# Patient Record
Sex: Male | Born: 1960
Health system: Southern US, Community
[De-identification: ages and names within clinical notes are randomized; demographics above are authoritative.]

## PROBLEM LIST (undated history)

## (undated) DIAGNOSIS — N189 Chronic kidney disease, unspecified: Secondary | ICD-10-CM

## (undated) DIAGNOSIS — K219 Gastro-esophageal reflux disease without esophagitis: Secondary | ICD-10-CM

## (undated) DIAGNOSIS — G4730 Sleep apnea, unspecified: Secondary | ICD-10-CM

## (undated) DIAGNOSIS — F1011 Alcohol abuse, in remission: Secondary | ICD-10-CM

## (undated) DIAGNOSIS — M1990 Unspecified osteoarthritis, unspecified site: Secondary | ICD-10-CM

## (undated) DIAGNOSIS — Z87442 Personal history of urinary calculi: Secondary | ICD-10-CM

## (undated) DIAGNOSIS — G610 Guillain-Barre syndrome: Secondary | ICD-10-CM

## (undated) DIAGNOSIS — I10 Essential (primary) hypertension: Secondary | ICD-10-CM

## (undated) HISTORY — PX: TONSILLECTOMY: SUR1361

## (undated) HISTORY — PX: NO PAST SURGERIES: SHX2092

---

## 2015-08-28 ENCOUNTER — Inpatient Hospital Stay (HOSPITAL_COMMUNITY)
Admission: EM | Admit: 2015-08-28 | Discharge: 2015-09-03 | DRG: 096 | Disposition: A | Discharge status: Home / Self Care | Payer: Federal, State, Local not specified - PPO | Attending: Internal Medicine | Admitting: Internal Medicine

## 2015-08-28 ENCOUNTER — Emergency Department (HOSPITAL_COMMUNITY): Payer: Federal, State, Local not specified - PPO

## 2015-08-28 ENCOUNTER — Encounter (HOSPITAL_COMMUNITY): Payer: Self-pay

## 2015-08-28 DIAGNOSIS — R200 Anesthesia of skin: Secondary | ICD-10-CM | POA: Diagnosis not present

## 2015-08-28 DIAGNOSIS — R202 Paresthesia of skin: Secondary | ICD-10-CM | POA: Diagnosis not present

## 2015-08-28 DIAGNOSIS — G610 Guillain-Barre syndrome: Secondary | ICD-10-CM | POA: Insufficient documentation

## 2015-08-28 DIAGNOSIS — I809 Phlebitis and thrombophlebitis of unspecified site: Secondary | ICD-10-CM | POA: Diagnosis not present

## 2015-08-28 DIAGNOSIS — F17290 Nicotine dependence, other tobacco product, uncomplicated: Secondary | ICD-10-CM | POA: Diagnosis present

## 2015-08-28 DIAGNOSIS — Z7952 Long term (current) use of systemic steroids: Secondary | ICD-10-CM

## 2015-08-28 DIAGNOSIS — R29898 Other symptoms and signs involving the musculoskeletal system: Secondary | ICD-10-CM | POA: Diagnosis present

## 2015-08-28 DIAGNOSIS — L539 Erythematous condition, unspecified: Secondary | ICD-10-CM

## 2015-08-28 DIAGNOSIS — M25512 Pain in left shoulder: Secondary | ICD-10-CM

## 2015-08-28 DIAGNOSIS — D696 Thrombocytopenia, unspecified: Secondary | ICD-10-CM | POA: Diagnosis present

## 2015-08-28 DIAGNOSIS — M7542 Impingement syndrome of left shoulder: Secondary | ICD-10-CM | POA: Diagnosis present

## 2015-08-28 DIAGNOSIS — M6289 Other specified disorders of muscle: Secondary | ICD-10-CM | POA: Diagnosis not present

## 2015-08-28 LAB — URINALYSIS, ROUTINE W REFLEX MICROSCOPIC
BILIRUBIN URINE: NEGATIVE
GLUCOSE, UA: NEGATIVE mg/dL
HGB URINE DIPSTICK: NEGATIVE
KETONES UR: NEGATIVE mg/dL
Leukocytes, UA: NEGATIVE
Nitrite: NEGATIVE
PROTEIN: NEGATIVE mg/dL
Specific Gravity, Urine: 1.018 (ref 1.005–1.030)
pH: 7.5 (ref 5.0–8.0)

## 2015-08-28 LAB — CBC WITH DIFFERENTIAL/PLATELET
BASOS ABS: 0 10*3/uL (ref 0.0–0.1)
Basophils Relative: 0 %
Eosinophils Absolute: 0 10*3/uL (ref 0.0–0.7)
Eosinophils Relative: 0 %
HEMATOCRIT: 48.6 % (ref 39.0–52.0)
Hemoglobin: 16.6 g/dL (ref 13.0–17.0)
LYMPHS ABS: 1.4 10*3/uL (ref 0.7–4.0)
LYMPHS PCT: 13 %
MCH: 29.5 pg (ref 26.0–34.0)
MCHC: 34.2 g/dL (ref 30.0–36.0)
MCV: 86.3 fL (ref 78.0–100.0)
MONO ABS: 0.3 10*3/uL (ref 0.1–1.0)
Monocytes Relative: 2 %
NEUTROS ABS: 8.9 10*3/uL — AB (ref 1.7–7.7)
Neutrophils Relative %: 85 %
Platelets: 131 10*3/uL — ABNORMAL LOW (ref 150–400)
RBC: 5.63 MIL/uL (ref 4.22–5.81)
RDW: 12.8 % (ref 11.5–15.5)
WBC: 10.7 10*3/uL — ABNORMAL HIGH (ref 4.0–10.5)

## 2015-08-28 LAB — COMPREHENSIVE METABOLIC PANEL
ALBUMIN: 4.5 g/dL (ref 3.5–5.0)
ALK PHOS: 95 U/L (ref 38–126)
ALT: 28 U/L (ref 17–63)
ANION GAP: 11 (ref 5–15)
AST: 24 U/L (ref 15–41)
BILIRUBIN TOTAL: 0.7 mg/dL (ref 0.3–1.2)
BUN: 16 mg/dL (ref 6–20)
CALCIUM: 10.3 mg/dL (ref 8.9–10.3)
CO2: 26 mmol/L (ref 22–32)
Chloride: 106 mmol/L (ref 101–111)
Creatinine, Ser: 0.85 mg/dL (ref 0.61–1.24)
GFR calc Af Amer: >60 mL/min (ref >60)
GFR calc non Af Amer: >60 mL/min (ref >60)
GLUCOSE: 111 mg/dL — AB (ref 65–99)
Potassium: 4.1 mmol/L (ref 3.5–5.1)
Sodium: 143 mmol/L (ref 135–145)
TOTAL PROTEIN: 7.5 g/dL (ref 6.5–8.1)

## 2015-08-28 LAB — LIPASE, BLOOD: Lipase: 25 U/L (ref 11–51)

## 2015-08-28 MED ORDER — GADOBENATE DIMEGLUMINE 529 MG/ML IV SOLN
20.0000 mL | Freq: Once | INTRAVENOUS | Status: AC | PRN
  Administered 2015-08-28: 20 mL via INTRAVENOUS

## 2015-08-28 NOTE — ED Notes (Signed)
Pt went to MRI.

## 2015-08-28 NOTE — ED Provider Notes (Signed)
CSN: [id number]     Arrival date & time 08/28/15  1715 History   First MD Initiated Contact with Patient 08/28/15 1725     Chief Complaint  Patient presents with  . Neurologic Problem     (Consider location/radiation/quality/duration/timing/severity/associated sxs/prior Treatment) HPI   Patient has a 55 year old male with no past medical history presents to the ED with complaint of numbness, onset 3 days. Patient reports on Thursday he began having numbness to bilateral fingertips. He notes when he woke up Friday morning his numbness began spreading across his entire body and notes it has worsened, denies any aggravating or alleviating factors. He reports having the sensation of "it feels like when your hands/feet fall asleep". He also reports having worsening generalized weakness over the past few days. He states he has had difficulty swallowing and feels like he is swallowing, and when eating food. He also reports having difficulty coughing, bearing down to have a bowel movement or initiating urinating. Patient also endorses having change in vision, he notes it is difficult for him to see things up close. Denies fever, chills, headache, difficulty breathing, chest pain, abdominal pain, nausea, vomiting, diarrhea, blood in urine or stool, syncope, seizure. Patient states he was seen by his PCP on Thursday and was scheduled to have the lab work and an MRI done. He notes he was given prednisone to take and states he has taken 2 doses. Patient also reports recently getting over a URI reported nasal congestion and nonproductive cough.  History reviewed. No pertinent past medical history. History reviewed. No pertinent past surgical history. Family History  Problem Relation Age of Onset  . Cancer Mother   . Cancer Father   . Diabetes Paternal Uncle    Social History  Substance Use Topics  . Smoking status: Current Some Day Smoker    Types: Cigars  . Smokeless tobacco: None  . Alcohol Use: No     Review of Systems  HENT: Positive for trouble swallowing.   Eyes: Positive for visual disturbance.  Gastrointestinal:       Difficulty having a BM  Genitourinary: Positive for difficulty urinating.  Neurological: Positive for weakness and numbness.  All other systems reviewed and are negative.     Allergies  Review of patient's allergies indicates no known allergies.  Home Medications   Prior to Admission medications   Medication Sig Start Date End Date Taking? Authorizing Provider  fluticasone (FLONASE) 50 MCG/ACT nasal spray Place 1 spray into both nostrils daily as needed for allergies or rhinitis.   Yes Historical Provider, MD  Phenyleph-Doxylamine-DM-APAP (ALKA-SELTZER PLS NIGHT CLD/FLU) 5-6.25-10-325 MG CAPS Take 2 capsules by mouth at bedtime as needed (for cold).   Yes Historical Provider, MD  predniSONE (STERAPRED UNI-PAK 21 TAB) 10 MG (21) TBPK tablet Take 20 mg by mouth daily.   Yes Historical Provider, MD   BP 158/92 mmHg  Pulse 83  Temp(Src) 99 F (37.2 C) (Oral)  Resp 14  Ht 5\' 8"  (1.727 m)  Wt 95.709 kg  BMI 32.09 kg/m2  SpO2 96% Physical Exam  Constitutional: He is oriented to person, place, and time. He appears well-developed and well-nourished. No distress.  HENT:  Head: Normocephalic and atraumatic.  Right Ear: Tympanic membrane normal.  Left Ear: Tympanic membrane normal.  Nose: Nose normal.  Mouth/Throat: Uvula is midline, oropharynx is clear and moist and mucous membranes are normal. No oropharyngeal exudate.  Eyes: Conjunctivae and EOM are normal. Pupils are equal, round, and reactive to light. Right  eye exhibits no discharge. Left eye exhibits no discharge. No scleral icterus.  Neck: Normal range of motion. Neck supple.  Cardiovascular: Normal rate, regular rhythm, normal heart sounds and intact distal pulses.   Pulmonary/Chest: Effort normal and breath sounds normal. No respiratory distress. He has no wheezes. He has no rales. He exhibits no  tenderness.  Abdominal: Soft. Bowel sounds are normal. He exhibits no distension and no mass. There is tenderness (epigastric). There is no rebound and no guarding.  Musculoskeletal: Normal range of motion. He exhibits no edema.  Lymphadenopathy:    He has no cervical adenopathy.  Neurological: He is alert and oriented to person, place, and time. A sensory deficit (Pt reports having sensation of "pins and needles" with light palpation to entire body) is present. No cranial nerve deficit. He displays a negative Romberg sign. Coordination normal.  4+/5 strength noted to BUE and BLE. Diminished DTRs bilaterally.  Skin: Skin is warm and dry. He is not diaphoretic.  Nursing note and vitals reviewed.   ED Course  Procedures (including critical care time) Labs Review Labs Reviewed  CBC WITH DIFFERENTIAL/PLATELET - Abnormal; Notable for the following:    WBC 10.7 (*)    Platelets 131 (*)    Neutro Abs 8.9 (*)    All other components within normal limits  COMPREHENSIVE METABOLIC PANEL - Abnormal; Notable for the following:    Glucose, Bld 111 (*)    All other components within normal limits  LIPASE, BLOOD  URINALYSIS, ROUTINE W REFLEX MICROSCOPIC (NOT AT [facility])    Imaging Review No results found. I have personally reviewed and evaluated these images and lab results as part of my medical decision-making.   EKG Interpretation   Date/Time:  Saturday August 28 2015 17:38:35 EST Ventricular Rate:  81 PR Interval:  169 QRS Duration: 102 QT Interval:  379 QTC Calculation: 440 R Axis:   -130 Text Interpretation:  Sinus rhythm Right axis deviation No old tracing to  compare Confirmed by FLOYD MD, DANIEL [phone]) on 08/28/2015 6:09:33 PM      MDM   Final diagnoses:  Paresthesias  Paresthesias  Paresthesias    Patient presents with worsening numbness and generalized weakness. Denies fever. Reports recent URI symptoms over the past week. VSS. Exam revealed mild epigastric tenderness,  no peritoneal signs. On her exam patient reports having "pins and needles" sensation over his entire body with light palpation, 4+/5 strength noted to upper and lower extremities. Diminished DTRs noted bilaterally. EKG revealed sinus rhythm with right axis deviation, no old tracing to compare. WBC 10.7. Remaining labs unremarkable.  Consulted neurology. Dr. Lavon Paganini advised to hold off on ordering imaging until patient is evaluated by Dr. Leroy Kennedy in the ED. He states he will notify Dr. Leroy Kennedy himself. Dr. Cyril Mourning evaluated patient in the ED and placed orders for MRI brain, cervical spine and thoracic spine. Dr. Cyril Mourning spoke with Dr. Adela Lank advised to perform an LP if MRIs are negative. Dr. Rhunette Croft attempted to perform the LP but was unsuccessful. I spoke will Dr. Leroy Kennedy who recommended to admit pt to medicine to have fluro performed for further evaluation. Consulted hospitalist, Dr. Maryfrances Bunnell agrees to admission. Discussed plan for admission with patient.  Satira Sark [place], PA-C 08/29/15 0139  Melene Plan, DO 08/29/15 1353

## 2015-08-28 NOTE — ED Notes (Signed)
Pt returned to room  

## 2015-08-28 NOTE — Consult Note (Signed)
NEURO HOSPITALIST CONSULT NOTE   Referring physician: ED Reason for Consult: diffuse body paresthesias with generalized weakness  HPI:                                                                                                                                          Steven Steele is an 55 y.o. male without significant past medical history, comes in accompanied by his wife for evaluation of the aforementioned symptoms. Stated that never had similar symptoms, but 3 days ago developed acute onset of numbness in the left fingertips that subsequently traveled to the right hand-toes " and eventually moved to the whole body" Michela Pitcher that he feels weak all over, " with a strange sensation of pins and needless all over my body'. Mr. Burdin said that the generalized weakness is so prominent that is hard for him to walk. In addition, he tells me that it is hard to swallow and feels that he is not able to completely empty his bladder and bowels. Wife said that his voice has changed and now is " softer".  He reports a recent cold as well as an itchy rash in his neck that was treated with prednisone pack and is now gone. Denies HA, vertigo, double vision, slurred speech, confusion, language or vision impairment. No fever, chills, diarrhea, foreign, travel, recent surgeries, or vaccination. Available serologies reviewed: wbc 10.7, unremarkable metabolic panel, UA without inection     History reviewed. No pertinent past medical history.  History reviewed. No pertinent past surgical history.  Family History  Problem Relation Age of Onset  . Cancer Mother   . Cancer Father   . Diabetes Paternal Uncle     Family History: no MS, epilepsy, or brain tumor   Social History:  reports that he has been smoking Cigars.  He does not have any smokeless tobacco history on file. He reports that he does not drink alcohol or use illicit drugs.  No Known Allergies  MEDICATIONS:                                                                                                                      Prior to Admission:  (Not in a hospital admission)   ROS:  History obtained from chart review and the patient  General ROS: negative for - chills, fatigue, fever, night sweats, or weight loss Psychological ROS: negative for - behavioral disorder, hallucinations, memory difficulties, mood swings or suicidal ideation Ophthalmic ROS: negative for - blurry vision, double vision, eye pain or loss of vision ENT ROS: negative for - epistaxis, nasal discharge, oral lesions, sore throat, tinnitus or vertigo Allergy and Immunology ROS: negative for - hives or itchy/watery eyes Hematological and Lymphatic ROS: negative for - bleeding problems, bruising or swollen lymph nodes Endocrine ROS: negative for - galactorrhea, hair pattern changes, polydipsia/polyuria or temperature intolerance Respiratory ROS: negative for - hemoptysis, shortness of breath or wheezing Cardiovascular ROS: negative for - chest pain, dyspnea on exertion, edema or irregular heartbeat Gastrointestinal ROS: negative for - abdominal pain, diarrhea, hematemesis, nausea/vomiting or stool incontinence Genito-Urinary ROS: negative for - dysuria, hematuria, incontinence or urinary frequency/urgency Musculoskeletal ROS: negative for - joint swelling Neurological ROS: as noted in HPI Dermatological ROS: negative for rash and skin lesion changes   Physical exam:  Constitutional: well developed, pleasant male in no apparent distress. Blood pressure 154/103, pulse 83, temperature 99 F (37.2 C), temperature source Oral, resp. rate 14, height 5' 8"  (1.727 m), weight 95.709 kg (211 lb), SpO2 95 %. Eyes: no jaundice or exophthalmos.  Head: normocephalic. Neck: supple, no bruits, no JVD. Cardiac: no  murmurs. Lungs: clear. Abdomen: soft, no tender, no mass. Extremities: no edema, clubbing, or cyanosis.  Skin: no rash  Neurologic Examination:                                                                                                      General: NAD Mental Status: Alert, oriented, thought content appropriate.  Speech fluent without evidence of aphasia.  Able to follow 3 step commands without difficulty. Cranial Nerves: II: Discs flat bilaterally; Visual fields grossly normal, pupils equal, round, reactive to light and accommodation III,IV, VI: ptosis not present, extra-ocular motions intact bilaterally V,VII: smile symmetric, facial light touch sensation normal bilaterally VIII: hearing normal bilaterally IX,X: uvula rises symmetrically XI: bilateral shoulder shrug XII: midline tongue extension without atrophy or fasciculations  Motor: 5/5 lower extremities proximally and distally. 4/5 right biceps and weak grip bilaterally but offers poor effort on testing. Tone and bulk:normal tone throughout; no atrophy noted Sensory: Pinprick and light touch intact throughout, bilaterally Deep Tendon Reflexes:  Hypoactive biceps-triceps-knee jerk reflexes bilaterally Plantars: Right: downgoing   Left: upngoing Cerebellar: normal finger-to-nose,  normal heel-to-shin test Gait:  No frank ataxia, negative Romberg's sign    No results found for: CHOL  Results for orders placed or performed during the hospital encounter of 08/28/15 (from the past 48 hour(s))  CBC with Differential     Status: Abnormal   Collection Time: 08/28/15  6:19 PM  Result Value Ref Range   WBC 10.7 (H) 4.0 - 10.5 K/uL   RBC 5.63 4.22 - 5.81 MIL/uL   Hemoglobin 16.6 13.0 - 17.0 g/dL   HCT 48.6 39.0 - 52.0 %   MCV 86.3 78.0 - 100.0 fL  MCH 29.5 26.0 - 34.0 pg   MCHC 34.2 30.0 - 36.0 g/dL   RDW 12.8 11.5 - 15.5 %   Platelets 131 (L) 150 - 400 K/uL   Neutrophils Relative % 85 %   Neutro Abs 8.9 (H) 1.7 -  7.7 K/uL   Lymphocytes Relative 13 %   Lymphs Abs 1.4 0.7 - 4.0 K/uL   Monocytes Relative 2 %   Monocytes Absolute 0.3 0.1 - 1.0 K/uL   Eosinophils Relative 0 %   Eosinophils Absolute 0.0 0.0 - 0.7 K/uL   Basophils Relative 0 %   Basophils Absolute 0.0 0.0 - 0.1 K/uL  Lipase, blood     Status: None   Collection Time: 08/28/15  6:19 PM  Result Value Ref Range   Lipase 25 11 - 51 U/L  Comprehensive metabolic panel     Status: Abnormal   Collection Time: 08/28/15  6:19 PM  Result Value Ref Range   Sodium 143 135 - 145 mmol/L   Potassium 4.1 3.5 - 5.1 mmol/L   Chloride 106 101 - 111 mmol/L   CO2 26 22 - 32 mmol/L   Glucose, Bld 111 (H) 65 - 99 mg/dL   BUN 16 6 - 20 mg/dL   Creatinine, Ser 0.85 0.61 - 1.24 mg/dL   Calcium 10.3 8.9 - 10.3 mg/dL   Total Protein 7.5 6.5 - 8.1 g/dL   Albumin 4.5 3.5 - 5.0 g/dL   AST 24 15 - 41 U/L   ALT 28 17 - 63 U/L   Alkaline Phosphatase 95 38 - 126 U/L   Total Bilirubin 0.7 0.3 - 1.2 mg/dL   GFR calc non Af Amer >60 >60 mL/min   GFR calc Af Amer >60 >60 mL/min    Comment: (NOTE) The eGFR has been calculated using the CKD EPI equation. This calculation has not been validated in all clinical situations. eGFR's persistently <60 mL/min signify possible Chronic Kidney Disease.    Anion gap 11 5 - 15  Urinalysis, Routine w reflex microscopic     Status: None   Collection Time: 08/28/15  7:32 PM  Result Value Ref Range   Color, Urine YELLOW YELLOW   APPearance CLEAR CLEAR   Specific Gravity, Urine 1.018 1.005 - 1.030   pH 7.5 5.0 - 8.0   Glucose, UA NEGATIVE NEGATIVE mg/dL   Hgb urine dipstick NEGATIVE NEGATIVE   Bilirubin Urine NEGATIVE NEGATIVE   Ketones, ur NEGATIVE NEGATIVE mg/dL   Protein, ur NEGATIVE NEGATIVE mg/dL   Nitrite NEGATIVE NEGATIVE   Leukocytes, UA NEGATIVE NEGATIVE    Comment: MICROSCOPIC NOT DONE ON URINES WITH NEGATIVE PROTEIN, BLOOD, LEUKOCYTES, NITRITE, OR GLUCOSE <1000 mg/dL.    No results  found.   Assessment/Plan: 55 y/o without known past medical history, presents with complains of generalized body paresthesias-weakness. Markedly hypoactive deep tendon reflexes upper and lower extremities bilaterally, ? upgoing left toe, but can not appreciate definite weakness in the lower extremites and suspect poor effort on testing motor strength upper extremities.  Patient syndrome is not entirely localizable to a specific segment of the neuro-axis. He certainly has depressed DTR's but the pattern of sensory and motor involvement is not quite typical for GBS. Ordered MRI brain and cervico-thoracic spine with and without contrast. LP if MRI negative. Will follow up after testing.  Dorian Pod, MD 08/28/2015, 7:51 PM  [facility]

## 2015-08-28 NOTE — ED Notes (Signed)
Pt remains in MRI 

## 2015-08-28 NOTE — ED Notes (Signed)
Pt arrives with c/o numbness starting at fingertips on Thursday and increasing over body since. Pt c/o difficulty swallowing. C/o intermittent rash at neck.

## 2015-08-29 ENCOUNTER — Encounter (HOSPITAL_COMMUNITY): Payer: Self-pay | Admitting: Family Medicine

## 2015-08-29 ENCOUNTER — Inpatient Hospital Stay (HOSPITAL_COMMUNITY): Payer: Federal, State, Local not specified - PPO

## 2015-08-29 DIAGNOSIS — D696 Thrombocytopenia, unspecified: Secondary | ICD-10-CM | POA: Diagnosis present

## 2015-08-29 DIAGNOSIS — Z7952 Long term (current) use of systemic steroids: Secondary | ICD-10-CM | POA: Diagnosis not present

## 2015-08-29 DIAGNOSIS — R200 Anesthesia of skin: Secondary | ICD-10-CM | POA: Diagnosis present

## 2015-08-29 DIAGNOSIS — I809 Phlebitis and thrombophlebitis of unspecified site: Secondary | ICD-10-CM | POA: Diagnosis not present

## 2015-08-29 DIAGNOSIS — F17290 Nicotine dependence, other tobacco product, uncomplicated: Secondary | ICD-10-CM | POA: Diagnosis present

## 2015-08-29 DIAGNOSIS — R202 Paresthesia of skin: Secondary | ICD-10-CM | POA: Diagnosis not present

## 2015-08-29 DIAGNOSIS — G610 Guillain-Barre syndrome: Secondary | ICD-10-CM | POA: Diagnosis present

## 2015-08-29 DIAGNOSIS — L539 Erythematous condition, unspecified: Secondary | ICD-10-CM | POA: Diagnosis not present

## 2015-08-29 DIAGNOSIS — R29898 Other symptoms and signs involving the musculoskeletal system: Secondary | ICD-10-CM | POA: Diagnosis present

## 2015-08-29 DIAGNOSIS — M6289 Other specified disorders of muscle: Secondary | ICD-10-CM

## 2015-08-29 DIAGNOSIS — M7542 Impingement syndrome of left shoulder: Secondary | ICD-10-CM | POA: Diagnosis present

## 2015-08-29 LAB — CSF CELL COUNT WITH DIFFERENTIAL
EOS CSF: NONE SEEN % (ref 0–1)
RBC Count, CSF: 2 /mm3 — ABNORMAL HIGH
Segmented Neutrophils-CSF: NONE SEEN % (ref 0–6)
Tube #: 1
WBC, CSF: 0 /mm3 (ref 0–5)

## 2015-08-29 LAB — PROTEIN AND GLUCOSE, CSF
Glucose, CSF: 60 mg/dL (ref 40–70)
Total  Protein, CSF: 33 mg/dL (ref 15–45)

## 2015-08-29 MED ORDER — LORAZEPAM 1 MG PO TABS
1.0000 mg | ORAL_TABLET | Freq: Once | ORAL | Status: DC
  Filled 2015-08-29: qty 1

## 2015-08-29 MED ORDER — ACETAMINOPHEN 325 MG PO TABS
650.0000 mg | ORAL_TABLET | ORAL | Status: DC | PRN
  Administered 2015-08-29 – 2015-09-03 (×9): 650 mg via ORAL
  Filled 2015-08-29 (×9): qty 2

## 2015-08-29 MED ORDER — FLUTICASONE PROPIONATE 50 MCG/ACT NA SUSP
1.0000 | Freq: Every day | NASAL | Status: DC | PRN
Start: 2015-08-29 — End: 2015-09-03
  Administered 2015-08-29: 1 via NASAL
  Filled 2015-08-29: qty 16

## 2015-08-29 MED ORDER — HYDROCODONE-ACETAMINOPHEN 5-325 MG PO TABS
1.0000 | ORAL_TABLET | ORAL | Status: DC | PRN
  Filled 2015-08-29: qty 1

## 2015-08-29 MED ORDER — LIDOCAINE-PRILOCAINE 2.5-2.5 % EX CREA: TOPICAL_CREAM | Freq: Once | CUTANEOUS | Status: DC | PRN

## 2015-08-29 NOTE — ED Notes (Signed)
MD at bedside. 

## 2015-08-29 NOTE — Progress Notes (Signed)
Called to room by patient and his significant other; anxious to know test results; patient states,"i'm shutting down and I'm in this room.  Again denies shortness of breath, chest pain, ambulated again to the bathroom without gait disturbance; patient states, "my throat is closing". Patient's voice remains clear, vital signs stable, no cough present, swallowing without difficulty.  C/o sinus and can't breath out of his nose.  [facility] notified and Neurology in route to see patient now.

## 2015-08-29 NOTE — Progress Notes (Signed)
Pt c/o "fading away" and that his "body is shutting down." VS stable. Family at bedside demanding to see doctor. On call coverage paged. NP states she is not available to visit pt at this time. Neurologist paged. Dr. Leroy Kennedyamilo came to see pt. Pt and family informed that pt's attending MD will come tomorrow and address questions about possible antibiotic tx. Family and pt state that they understand. Will continue to monitor. Gara KronerHayles, Fanny Agan M, RN

## 2015-08-29 NOTE — ED Provider Notes (Signed)
  Physical Exam  BP 122/78 mmHg  Pulse 57  Temp(Src) 99 F (37.2 C) (Oral)  Resp 11  Ht 5\' 8"  (1.727 m)  Wt 211 lb (95.709 kg)  BMI 32.09 kg/m2  SpO2 97%  Physical Exam  ED Course  .Lumbar Puncture Date/Time: 08/29/2015 12:27 AM Performed by: Derwood KaplanNANAVATI, Zyrah Wiswell Authorized by: Derwood KaplanNANAVATI, Sevilla Murtagh Consent: Written consent obtained. Risks and benefits: risks, benefits and alternatives were discussed Consent given by: patient Patient understanding: patient states understanding of the procedure being performed Patient consent: the patient's understanding of the procedure matches consent given Site marked: the operative site was marked Patient identity confirmed: arm band Time out: Immediately prior to procedure a "time out" was called to verify the correct patient, procedure, equipment, support staff and site/side marked as required. Indications: evaluation for infection Anesthesia: local infiltration Local anesthetic: lidocaine 1% with epinephrine Anesthetic total: 3 ml Patient sedated: no Preparation: Patient was prepped and draped in the usual sterile fashion. Lumbar space: L4-L5 interspace Patient's position: left lateral decubitus Needle gauge: 20 Needle type: spinal needle - Quincke tip Number of attempts: 3 Post-procedure: site cleaned and adhesive bandage applied Patient tolerance: Patient tolerated the procedure well with no immediate complications    MDM  I attempted LP after the MRI was neg. On my 3 attempts, i consistently ended up hitting the bone, despite going in quite deep. LP under fluoro to be done tomorrow.       Derwood KaplanAnkit Gwendlyon Zumbro, MD 08/29/15 [phone]

## 2015-08-29 NOTE — Progress Notes (Signed)
Patient seen and examined  Admitted for suspected GBS,generalized body paresthesias-weakness MRI negative LP pending We'll check Lyme titers given rash on the neck one month ago

## 2015-08-29 NOTE — ED Notes (Signed)
Patient is resting comfortably. 

## 2015-08-29 NOTE — H&P (Signed)
History and Physical  Patient Name: Steven Steele     ZOX:[id number]    DOB: 1961/04/08    DOA: 08/28/2015 Referring physician: Melburn Hake, PA-C PCP: Farris Has, MD      Chief Complaint: Acute paresthesias  HPI: Steven Steele is a 55 y.o. male with no significant past medical history significant who presents with paresthesias for two days.  The patient was in his usual state of health until three days ago when he went to bed with "pins and needles" in the left hand.  The next day, he woke up with this sensation "prickly" and "pins and needles" all over his whole body (face, mouth, trunk, arms, legs).  He went to his PCP who recommended prednisone.  Today, the sensation was no different, and he noticed difficulty with weak hand grip, weakness of his eyes looking up (no diplopia), feeling like food in his mouth was "like cotton", abnormal sensation while having a bowel movement, feeling like his bowels were "stuck", weak cough, soft voice, and incomplete bowel and bladder evacuation.  So he came to the ER.  In the ED, the patient had a low grade fever, mild tachycardia. The electrolytes, urine dipstick and LFTs were normal.  There was mild thrombocytopenia but no leukocytosis or anemia.  An MRI of the brain, and cervical/thoracic spinal cord was unremarkable (mild degenerative changes).    He reports no previous symptoms, no previous major illnesses.  Was just getting over a URI and exposed to wife who had same.  Had a rash on the neck that has appeared several times since Nov, always on the neck, itchy, and lasting hours to a day, then completely disappearing (picture from wife's phone shows 3-4 small hives on the anterior neck, surrounding erythema).  He works in [employer], no chemical exposures.  Last travel was to [facility] for work, >1 month ago.  He eats a varied diet. Is planning a wedding soon, but no recent life stressors.   Review of Systems:  He endorses weak cough,  resolving URI symptoms, bowel and bladder control changes, can't "eyes up all the way," focus up close. Also endorses rash as above, abdominal discomfort/"gut not working" and memory loss.  General: Denies fever, chills, fatigue, malaise, night sweats, or weight loss. Psychological: Denies hallucinations, life stressors. Ophthalmic: Denies photophobia.   No diplopia. ENT: Denies headache, sore throat, epistaxis. Hematological and Lymphatic: Denies excessive bleeding, blood clots, bruising, night sweats, pallor, or swollen lymph nodes. Endocrine: Denies hair loss, polydipsia/polyuria, or temperature intolerance. Respiratory: Denies hemoptysis, shortness of breath, sputum changes, or wheezing. Cardiovascular: Denies chest discomfort, dyspnea on exertion, LE edema, irregular heartbeat, orthopnea, paroxysmal nocturnal dyspnea, or rapid heart rate. Gastrointestinal: Denies abdominal pain, appetite loss, blood in stools, constipation, diarrhea, heartburn, hematemesis, melena, nausea/vomiting, dysphagia. Genitourinary: Denies dysuria, urinary frequency, hematuria, incontinence.   Musculoskeletal: Denies joint pain/swelling, muscle pain, injury, or pain or swelling in extremities/back, neck.    Neurological: Denies confusion, seizures, syncope, weakness, speech problems, ataxia, headaches. Dermatological: Denies changes to hair or nails.     Allergies: No Known Allergies  Home medications: 1. Fluticasone PRN  Past medical history: 1. Vertigo once  Past surgical history: None  Family history:  Lung cancer, father. Breast cancer, mother. Diabetes, paternal uncle. No family history of neuropathy.  No family history of autoimmune disease that he knows of.    Social History:  Patient lives with his fiancee.  He works in Teacher, English as a foreign language.  He travels to [place] occasionally for work, not  in many months, no exposure to chemicals at home or work.  Sick contact in wife, URI.  Rare cigar use.   Recovering alcoholic, no alcohol currently.  Denies illicit drugs.  No life stressors other than planning wedding.  From [place], moved to [place] about 2 years ago.        Physical Exam: BP 122/78 mmHg  Pulse 57  Temp(Src) 99 F (37.2 C) (Oral)  Resp 11  Ht 5\' 8"  (1.727 m)  Wt 95.709 kg (211 lb)  BMI 32.09 kg/m2  SpO2 97% General appearance: Well-developed, adult male, alert and in no acute distress.   Eyes: Anicteric, conjunctiva pink, lids and lashes normal.     ENT: No nasal deformity, discharge, or epistaxis.  OP moist without lesions.   Skin: Warm and dry.  No suspicious rashes or lesions.  Image of rash from wife's phone described above. Cardiac: Capillary refill is brisk.  JVP normal.  No LE edema.  Radial pulses 2+ and symmetric. Respiratory: Normal respiratory rate and rhythm.  CTAB. Abdomen: Abdomen soft without rigidity.  No TTP. No ascites, distension.   MSK: No deformities or effusions. Neuro: Sensorium intact and responding to questions, attention normal.  Speech is fluent.  Muscle bulk normal.  Appears slow and globally weak, but strength testing in upper and lower extremities bilaterally is symmetric and 5/5.   Touch testing on feet, arms is hyperesthetic to pinprick.  Intact to light touch.  All symmetric.   Psych: Behavior appropriate.  Affect constrained.  No evidence of aural or visual hallucinations or delusions.       Labs on Admission:  The metabolic panel shows normal electrolytes and renal function. Transaminases and bilirubin normal. Lipase normal. UA clear. The complete blood count shows mild thrombocytopenia.   Radiological Exams on Admission: [name] Wo Contrast and Mr Cervical Spine W Wo Contrast 08/28/2015   IMPRESSION: MRI BRAIN:  Normal MRI of the brain with and without contrast. MRI CERVICAL SPINE: Straightened cervical lordosis without acute fracture or malalignment. Normal contrast-enhanced MRI of the cervical spinal cord. Degenerative change  of the cervical spine superimposed on borderline congenital canal narrowing. Mild canal stenosis C4-5 through C6-7. Neural foraminal narrowing C3-4 through C5-6, C7-T1: Moderate to severe on the RIGHT at C3-4 and on the LEFT at C4-5. Electronically Signed   By: Awilda Metro M.D.   On: 08/28/2015 23:21   Mr Thoracic Spine W Wo Contrast 08/28/2015   IMPRESSION: Early degenerative change of thoracic spine without acute fracture nor malalignment. No neurocompressive changes. Normal MRI of the thoracic spinal cord with and without contrast. Electronically Signed   By: Awilda Metro M.D.   On: 08/28/2015 23:32    EKG: Independently reviewed. Sinus, rate 81.  RAD and poor R-wave progression.  No STTW changes.    Assessment/Plan 1. Paresthesias and weak hand grip:  This is new.  The onset was acute, the symptoms include sensory and motor systems.  The involvement of the mouth and face seems to suggest a systemic illness or central process.  MRI normal. The remaining differential diagnosis potentially includes GBS, nutritional deficiencies.  Given that it is not just diffuse, but uniform and symmetric, I find vasculitis doubtful.  Hyperesthesia is prominent.  Normal MRI rules out stroke and MS.   -LP under fluoroscopic guidance (cell count, differential, culture, other studies per Neurology) -Consult to Neurology, appreciate cares -Will defer laboratory work up to Neurology     DVT PPx: SCDs Diet: Regular Consultants: Neurology  Code Status: Full Family Communication: Steffanie RainwaterFiancee present at bedside.  Medical decision making: What exists of the patient's previous chart was reviewed in depth and the case was discussed with Dr Leroy Kennedyamilo and Melburn HakeNicole Nadeau, PA_C. Patient seen 2:24 AM on 08/29/2015.  Disposition Plan:  Admit for observation and fluoro-guided LP.  If accomplished, discharge home tomorrow at Neuro discretion.      Alberteen SamChristopher P Lenell Lama [facility] Pager  [phone]

## 2015-08-29 NOTE — Progress Notes (Signed)
NEURO HOSPITALIST PROGRESS NOTE   SUBJECTIVE:                                                                                                                        Lying in bed, said that " I feel that I am shutting down, my body is fading away". Further, feels that his abdomen is blowing, can not move his bowels, can barely swallow, feels short of breath an very weak all over, can not even walk to the bathroom. MRI brain and cervico-thoracic spine is unrevealing. CSF unremarkable. Lyme titers pending.  OBJECTIVE:                                                                                                                           Vital signs in last 24 hours: Temp:  [97.5 F (36.4 C)-98.3 F (36.8 C)] 98.3 F (36.8 C) (01/08 1721) Pulse Rate:  [55-96] 79 (01/08 1721) Resp:  [8-19] 16 (01/08 1721) BP: (98-166)/(71-106) 149/104 mmHg (01/08 1721) SpO2:  [88 %-99 %] 99 % (01/08 1721)  Intake/Output from previous day:   Intake/Output this shift:   Nutritional status: Diet regular Room service appropriate?: Yes; Fluid consistency:: Thin  History reviewed. No pertinent past medical history.  Physical exam:  Constitutional: well developed, pleasant male, looks anxious Eyes: no jaundice or exophthalmos.  Head: normocephalic. Neck: supple, no bruits, no JVD. Cardiac: no murmurs. Lungs: clear. Abdomen: soft, no tender, no mass. Extremities: no edema, clubbing, or cyanosis.  Skin: no rash  Neurologic Exam:  General: NAD Mental Status: Alert, oriented, thought content appropriate. Speech fluent without evidence of aphasia. Able to follow 3 step commands without difficulty. Cranial Nerves: II: Discs flat bilaterally; Visual fields grossly normal, pupils equal, round, reactive to light and accommodation III,IV, VI: ptosis not present, extra-ocular motions intact bilaterally V,VII: smile symmetric, facial light touch sensation normal  bilaterally VIII: hearing normal bilaterally IX,X: uvula rises symmetrically XI: bilateral shoulder shrug XII: midline tongue extension without atrophy or fasciculations Motor: 5/5 lower extremities proximally and distally. 4/5 right biceps and weak grip bilaterally but offers poor effort on testing. Tone and bulk:normal tone throughout; no atrophy noted Sensory: Pinprick and light touch intact throughout, bilaterally Deep Tendon Reflexes:  Hypoactive biceps-triceps-knee  jerk reflexes bilaterally Plantars: Right: downgoingLeft: upngoing Cerebellar: normal finger-to-nose, normal heel-to-shin test Gait:  No frank ataxia, negative Romberg's sign   Lab Results: No results found for: CHOL Lipid Panel No results for input(s): CHOL, TRIG, HDL, CHOLHDL, VLDL, LDLCALC in the last 72 hours.  Studies/Results: [name] Wo Contrast  08/28/2015  CLINICAL DATA:  Developed LEFT hand hand numbness 3 days ago, ascending to entire body with generalized weakness and paresthesias. Difficulty walking and swallowing. EXAM: MRI HEAD WITHOUT AND WITH CONTRAST MRI CERVICAL SPINE WITHOUT AND WITH CONTRAST TECHNIQUE: Multiplanar, multiecho pulse sequences of the brain and surrounding structures, and cervical spine, to include the craniocervical junction and cervicothoracic junction, were obtained without and with intravenous contrast. CONTRAST:  20mL MULTIHANCE GADOBENATE DIMEGLUMINE 529 MG/ML IV SOLN 20 cc MultiHance COMPARISON:  None. FINDINGS: MRI HEAD FINDINGS The ventricles and sulci are normal for patient's age. No abnormal parenchymal signal, mass lesions, mass effect. No abnormal parenchymal enhancement. No reduced diffusion to suggest acute ischemia. No susceptibility artifact to suggest hemorrhage. No abnormal extra-axial fluid collections. No extra-axial masses nor leptomeningeal enhancement. Normal major intracranial vascular flow voids seen at the skull base. Ocular  globes and orbital contents are nonsuspicious though not tailored for evaluation ; old LEFT medial orbital blowout fracture. No suspicious calvarial bone marrow signal. No abnormal sellar expansion. Craniocervical junction maintained. Mild fronto ethmoidal mucosal thickening without paranasal sinus air-fluid levels. Mastoid air cells are well aerated. MRI CERVICAL SPINE FINDINGS Cervical vertebral bodies intact and aligned with straightened cervical lordosis. Borderline congenital canal narrowing on the basis of foreshortened pedicles. Moderate to severe C5-6 disc height loss, moderate at C6-7 with moderate subacute to chronic discogenic endplate changes. Mild-to-moderate C3-4 chronic discogenic endplate changes. No STIR signal abnormality to suggest acute osseous process. No abnormal osseous or intradiscal enhancement. Cervical spinal cord normal morphology and signal characteristics of are medullary junction is well though she 2 3 vomiting most caudal well visualized level. No abnormal cord, leptomeningeal or epidural enhancement. Prevertebral area pacer normal. Level by level evaluation: C2-3: No significant disc bulge. Mild facet arthropathy without canal stenosis or neural foraminal narrowing. C3-4: Annular bulging. Uncovertebral hypertrophy and mild facet arthropathy. No canal stenosis. Moderate to severe RIGHT, moderate LEFT neural foraminal narrowing. C4-5: Small broad-based disc bulge. Uncovertebral hypertrophy. Mild canal stenosis. Moderate RIGHT, moderate to severe LEFT neural foraminal narrowing. C5-6: Small broad-based disc bulge, small LEFT central to subarticular disc protrusion. Uncovertebral hypertrophy. Mild canal stenosis. Mild to moderate bilateral neural foraminal narrowing. C6-7: Moderate LEFT central disc protrusion, uncovertebral hypertrophy. Mild canal stenosis with slight LEFT ventral cord deformity. No neural foraminal narrowing. C7-T1: Small broad-based disc bulge, no canal stenosis. Mild  bilateral neural foraminal narrowing. IMPRESSION: MRI BRAIN:  Normal MRI of the brain with and without contrast. MRI CERVICAL SPINE: Straightened cervical lordosis without acute fracture or malalignment. Normal contrast-enhanced MRI of the cervical spinal cord. Degenerative change of the cervical spine superimposed on borderline congenital canal narrowing. Mild canal stenosis C4-5 through C6-7. Neural foraminal narrowing C3-4 through C5-6, C7-T1: Moderate to severe on the RIGHT at C3-4 and on the LEFT at C4-5. Electronically Signed   By: Awilda Metro M.D.   On: 08/28/2015 23:21   Mr Cervical Spine W Wo Contrast  08/28/2015  CLINICAL DATA:  Developed LEFT hand hand numbness 3 days ago, ascending to entire body with generalized weakness and paresthesias. Difficulty walking and swallowing. EXAM: MRI HEAD WITHOUT AND WITH CONTRAST MRI CERVICAL SPINE WITHOUT AND WITH CONTRAST TECHNIQUE:  Multiplanar, multiecho pulse sequences of the brain and surrounding structures, and cervical spine, to include the craniocervical junction and cervicothoracic junction, were obtained without and with intravenous contrast. CONTRAST:  20mL MULTIHANCE GADOBENATE DIMEGLUMINE 529 MG/ML IV SOLN 20 cc MultiHance COMPARISON:  None. FINDINGS: MRI HEAD FINDINGS The ventricles and sulci are normal for patient's age. No abnormal parenchymal signal, mass lesions, mass effect. No abnormal parenchymal enhancement. No reduced diffusion to suggest acute ischemia. No susceptibility artifact to suggest hemorrhage. No abnormal extra-axial fluid collections. No extra-axial masses nor leptomeningeal enhancement. Normal major intracranial vascular flow voids seen at the skull base. Ocular globes and orbital contents are nonsuspicious though not tailored for evaluation ; old LEFT medial orbital blowout fracture. No suspicious calvarial bone marrow signal. No abnormal sellar expansion. Craniocervical junction maintained. Mild fronto ethmoidal mucosal  thickening without paranasal sinus air-fluid levels. Mastoid air cells are well aerated. MRI CERVICAL SPINE FINDINGS Cervical vertebral bodies intact and aligned with straightened cervical lordosis. Borderline congenital canal narrowing on the basis of foreshortened pedicles. Moderate to severe C5-6 disc height loss, moderate at C6-7 with moderate subacute to chronic discogenic endplate changes. Mild-to-moderate C3-4 chronic discogenic endplate changes. No STIR signal abnormality to suggest acute osseous process. No abnormal osseous or intradiscal enhancement. Cervical spinal cord normal morphology and signal characteristics of are medullary junction is well though she 2 3 vomiting most caudal well visualized level. No abnormal cord, leptomeningeal or epidural enhancement. Prevertebral area pacer normal. Level by level evaluation: C2-3: No significant disc bulge. Mild facet arthropathy without canal stenosis or neural foraminal narrowing. C3-4: Annular bulging. Uncovertebral hypertrophy and mild facet arthropathy. No canal stenosis. Moderate to severe RIGHT, moderate LEFT neural foraminal narrowing. C4-5: Small broad-based disc bulge. Uncovertebral hypertrophy. Mild canal stenosis. Moderate RIGHT, moderate to severe LEFT neural foraminal narrowing. C5-6: Small broad-based disc bulge, small LEFT central to subarticular disc protrusion. Uncovertebral hypertrophy. Mild canal stenosis. Mild to moderate bilateral neural foraminal narrowing. C6-7: Moderate LEFT central disc protrusion, uncovertebral hypertrophy. Mild canal stenosis with slight LEFT ventral cord deformity. No neural foraminal narrowing. C7-T1: Small broad-based disc bulge, no canal stenosis. Mild bilateral neural foraminal narrowing. IMPRESSION: MRI BRAIN:  Normal MRI of the brain with and without contrast. MRI CERVICAL SPINE: Straightened cervical lordosis without acute fracture or malalignment. Normal contrast-enhanced MRI of the cervical spinal cord.  Degenerative change of the cervical spine superimposed on borderline congenital canal narrowing. Mild canal stenosis C4-5 through C6-7. Neural foraminal narrowing C3-4 through C5-6, C7-T1: Moderate to severe on the RIGHT at C3-4 and on the LEFT at C4-5. Electronically Signed   By: Awilda Metro M.D.   On: 08/28/2015 23:21   Mr Thoracic Spine W Wo Contrast  08/28/2015  CLINICAL DATA:  Developed LEFT hand hand numbness 3 days ago, ascending to entire body with generalized weakness and paresthesias. Difficulty walking and swallowing. EXAM: MRI THORACIC WITHOUT AND WITH CONTRAST TECHNIQUE: Multiplanar and multiecho pulse sequences of the thoracic spine were obtained without and with intravenous contrast. CONTRAST:  20 cc MultiHance COMPARISON:  None. FINDINGS: MR THORACIC SPINE FINDINGS Thoracic vertebral bodies are intact and aligned maintenance of thoracic kyphosis. Intervertebral disc morphology generally preserved with decreased T2 without disc compatible with mild desiccation. Multilevel mild chronic discogenic endplate changes without STIR signal abnormality to suggest acute osseous process. No abnormal osseous or intradiscal enhancement. Thoracic spinal cord is normal morphology and signal characteristics to the level of the conus medullaris which is partially imaged at T12-L1. No abnormal cord, leptomeningeal or epidural  enhancement. Included prevertebral and paraspinal soft tissues are nonsuspicious. 2.2 cm nonenhancing cyst upper pole of RIGHT kidney. Mild lower thoracic facet arthropathy without significant disc bulge, canal stenosis or neural foraminal narrowing at any level. IMPRESSION: Early degenerative change of thoracic spine without acute fracture nor malalignment. No neurocompressive changes. Normal MRI of the thoracic spinal cord with and without contrast. Electronically Signed   By: Awilda Metro M.D.   On: 08/28/2015 23:32   Dg Fluoro Guide Lumbar Puncture  08/29/2015  CLINICAL DATA:   Numbness of hands and feet. Hypersensitivity to pain. EXAM: DIAGNOSTIC LUMBAR PUNCTURE UNDER FLUOROSCOPIC GUIDANCE FLUOROSCOPY TIME:  Fluoroscopy Time (in minutes and seconds): 48 seconds Number of Acquired Images:  2 PROCEDURE: Informed consent was obtained from the patient prior to the procedure, including potential complications of headache, allergy, and pain. With the patient prone, the lower back was prepped with Betadine. 1% Lidocaine was used for local anesthesia. Lumbar puncture was performed at the L3-L4 level using a 22 gauge needle with return of clear CSF with an opening pressure of 14 cm water. Fifteen ml of CSF were obtained for laboratory studies. The patient tolerated the procedure well and there were no apparent complications. IMPRESSION: Successful lumbar puncture, with normal opening pressure. CSF fluid was sent to microbiology as requested. Electronically Signed   By: Ted Mcalpine M.D.   On: 08/29/2015 13:28    MEDICATIONS                                                                                                                        Scheduled: . LORazepam  1 mg Oral Once    ASSESSMENT/PLAN:                                                                                                           55 y/o without known past medical history, presents with complains of generalized body paresthesias-weakness. Had recent itchy neck rash. Markedly hypoactive deep tendon reflexes upper and lower extremities bilaterally, ? upgoing left toe, but can not appreciate definite weakness in the lower extremites and suspect poor effort on testing motor strength upper extremities.  MRI brain and cervico-thoracic spine and CSF are unremarkable. At this moment, his clinical syndrome, neuro-imaging, and CSF analysis don't appear to suggest structural central or peripheral neuro involvement. Recent neck ras, thus wonder if his picture could be post infectious. Lyme titers are  pending. Neurology will sign off at this time, please call with any questions or concerns.  Wyatt Portela, MD [facility] [phone]  08/29/2015, 8:03 PM

## 2015-08-29 NOTE — Progress Notes (Signed)
Received from ER via stretcher; patient states, I'm to weak to get into the bed; patient transferred with the sheets; patient states,I have tingling pain in my hands, fingernails, and down into my feet.  Oriented patient to room and unit routine; neuro exam intact except c/o tingling pain all over his body and back pain.  Patient then assisted to bathroom to void; no difficulty in his gait. Denies shortness of breath or chest pain.

## 2015-08-30 DIAGNOSIS — R202 Paresthesia of skin: Secondary | ICD-10-CM

## 2015-08-30 LAB — CBC WITH DIFFERENTIAL/PLATELET
BASOS ABS: 0 10*3/uL (ref 0.0–0.1)
BASOS PCT: 0 %
Eosinophils Absolute: 0.3 10*3/uL (ref 0.0–0.7)
Eosinophils Relative: 3 %
HEMATOCRIT: 49 % (ref 39.0–52.0)
HEMOGLOBIN: 16.5 g/dL (ref 13.0–17.0)
Lymphocytes Relative: 31 %
Lymphs Abs: 3 10*3/uL (ref 0.7–4.0)
MCH: 29 pg (ref 26.0–34.0)
MCHC: 33.7 g/dL (ref 30.0–36.0)
MCV: 86.1 fL (ref 78.0–100.0)
Monocytes Absolute: 0.8 10*3/uL (ref 0.1–1.0)
Monocytes Relative: 8 %
NEUTROS ABS: 5.7 10*3/uL (ref 1.7–7.7)
NEUTROS PCT: 58 %
Platelets: 175 10*3/uL (ref 150–400)
RBC: 5.69 MIL/uL (ref 4.22–5.81)
RDW: 12.9 % (ref 11.5–15.5)
WBC: 9.7 10*3/uL (ref 4.0–10.5)

## 2015-08-30 LAB — COMPREHENSIVE METABOLIC PANEL
ALBUMIN: 4.4 g/dL (ref 3.5–5.0)
ALT: 27 U/L (ref 17–63)
AST: 24 U/L (ref 15–41)
Alkaline Phosphatase: 91 U/L (ref 38–126)
Anion gap: 12 (ref 5–15)
BILIRUBIN TOTAL: 1.3 mg/dL — AB (ref 0.3–1.2)
BUN: 21 mg/dL — AB (ref 6–20)
CO2: 25 mmol/L (ref 22–32)
Calcium: 10.3 mg/dL (ref 8.9–10.3)
Chloride: 104 mmol/L (ref 101–111)
Creatinine, Ser: 0.97 mg/dL (ref 0.61–1.24)
GFR calc Af Amer: >60 mL/min (ref >60)
GFR calc non Af Amer: >60 mL/min (ref >60)
GLUCOSE: 101 mg/dL — AB (ref 65–99)
POTASSIUM: 3.6 mmol/L (ref 3.5–5.1)
Sodium: 141 mmol/L (ref 135–145)
TOTAL PROTEIN: 7.8 g/dL (ref 6.5–8.1)

## 2015-08-30 LAB — SEDIMENTATION RATE: SED RATE: 4 mm/h (ref 0–16)

## 2015-08-30 LAB — B. BURGDORFI ANTIBODIES

## 2015-08-30 LAB — C-REACTIVE PROTEIN: CRP: 1.1 mg/dL — AB (ref <1.0)

## 2015-08-30 MED ORDER — POLYETHYLENE GLYCOL 3350 17 G PO PACK
17.0000 g | PACK | Freq: Every day | ORAL | Status: DC
  Administered 2015-08-30 – 2015-08-31 (×2): 17 g via ORAL
  Filled 2015-08-30 (×5): qty 1

## 2015-08-30 MED ORDER — DOCUSATE SODIUM 100 MG PO CAPS
200.0000 mg | ORAL_CAPSULE | Freq: Two times a day (BID) | ORAL | Status: DC
  Administered 2015-08-30 – 2015-09-02 (×8): 200 mg via ORAL
  Filled 2015-08-30 (×9): qty 2

## 2015-08-30 MED ORDER — IMMUNE GLOBULIN (HUMAN) 20 GM/200ML IV SOLN
400.0000 mg/kg | INTRAVENOUS | Status: AC
  Administered 2015-08-30 – 2015-09-03 (×5): 40 g via INTRAVENOUS
  Filled 2015-08-30 (×7): qty 400

## 2015-08-30 MED ORDER — ENOXAPARIN SODIUM 40 MG/0.4ML ~~LOC~~ SOLN
40.0000 mg | SUBCUTANEOUS | Status: DC
  Administered 2015-08-30 – 2015-08-31 (×2): 40 mg via SUBCUTANEOUS
  Filled 2015-08-30 (×3): qty 0.4

## 2015-08-30 NOTE — Evaluation (Signed)
Clinical/Bedside Swallow Evaluation Patient Details  Name: Steven LemonRobert Steele MRN: [id number] Date of Birth: 1960/11/16  Today's Date: 08/30/2015 Time: SLP Start Time (ACUTE ONLY): 1257 SLP Stop Time (ACUTE ONLY): 1312 SLP Time Calculation (min) (ACUTE ONLY): 15 min  Past Medical History: History reviewed. No pertinent past medical history. Past Surgical History: History reviewed. No pertinent past surgical history. HPI:  55 yo M with acute neuropathic symptoms most consistent with guillian-barre syndrome.  Pt reports difficulty swallowing.   Assessment / Plan / Recommendation Clinical Impression  Pt presents with complaints and demonstration of increased and deliberate effort required to initiate a swallow response.  He reports sensation of food, thicker liquids lodging near his pharynx and thin liquids possibly entering airway.  There were no overt s/s of aspiration observed during clinical exam, but pt's descriptions are concerning for worsening functioning.  CN function was intact. For now, recommend downgrading solids to mechanical soft, continue thin liquids, meds whole in liquids.  Encouraged pt to eat with caution and cease eating/drinking if symptoms worsen.  SLP will f/u next date for changes, necessity of MBS>  Pt is in agreement.      Aspiration Risk  Mild aspiration risk    Diet Recommendation     Medication Administration: Whole meds with liquid    Other  Recommendations Oral Care Recommendations: Oral care BID   Follow up Recommendations   (tba)    Frequency and Duration min 2x/week  2 weeks       Prognosis Prognosis for Safe Diet Advancement: Good      Swallow Study   General Date of Onset: 08/28/15 HPI: 55 yo M with acute neuropathic symptoms most consistent with guillian-barre syndrome.  Pt reports difficulty swallowing. Type of Study: Bedside Swallow Evaluation Previous Swallow Assessment: no Diet Prior to this Study: Regular;Thin liquids Temperature Spikes  Noted: No Respiratory Status: Room air History of Recent Intubation: No Behavior/Cognition: Alert;Cooperative;Pleasant mood Oral Cavity Assessment: Within Functional Limits Oral Care Completed by SLP: No Oral Cavity - Dentition: Adequate natural dentition Self-Feeding Abilities: Able to feed self Patient Positioning: Upright in bed Baseline Vocal Quality: Low vocal intensity Volitional Cough: Strong Volitional Swallow: Able to elicit    Oral/Motor/Sensory Function Overall Oral Motor/Sensory Function: Within functional limits   Ice Chips Ice chips: Within functional limits   Thin Liquid Thin Liquid: Impaired Presentation: Cup Pharyngeal  Phase Impairments:  (effortful, deliberate swallow)    Nectar Thick Nectar Thick Liquid: Impaired Presentation: Cup;Self Fed Oral phase functional implications:  (effortful swallow required to pass material)   Honey Thick Honey Thick Liquid: Not tested   Puree Puree: Impaired Presentation: Spoon Pharyngeal Phase Impairments: Multiple swallows   Solid   GO    Solid: Not tested      Rishik Tubby L. Samson Fredericouture, [place] CCC/SLP Pager [phone]  Blenda MountsCouture, Adalyna Godbee Laurice 08/30/2015,1:23 PM

## 2015-08-30 NOTE — Progress Notes (Signed)
Subjective: Continues to have paresthesias, they are worse distally, but present throughout. Now with some difficulty swallowing.   Exam: Filed Vitals:   08/30/15 0540 08/30/15 1036  BP: 145/89 151/96  Pulse: 78 75  Temp: 97.7 F (36.5 C) 97.7 F (36.5 C)  Resp: 18 18   Gen: In bed, NAD Resp: non-labored breathing, no acute distress Abd: soft, nt  Neuro: MS: Awake, alert, oriented.  ZO:XWRUECN:PERRL, EOMI, palate elevates well bilaterally Motor: 3/5 proximally in the arms and legs.  Sensory:decreased in teh distal arms to cold.  AVW:UJWJXBTR:Absent in the patellae, biceps, and ankle bilaterally.   Pertinent Labs: nml csf  Impression: 55 yo M with acute neuropathic symptoms most consistent with guillian-barre syndrome. Though he does not have an elevated protein in his CSF, this can be negative in the first week of symptoms. With consistent history and physical exam and no evidence for other causes by history or LP, I would favor treating as guillian-barre.  Recommendations: 1)IVIG total of 2g/kg divided by 5 days, for total 400mg  per day.  2) PT 3) ST 4) NIFs 5) telemetry  Ritta SlotMcNeill Laurian Edrington, MD [facility] [phone]  If 7pm- 7am, please page neurology on call as listed in [facility].

## 2015-08-30 NOTE — Evaluation (Signed)
Physical Therapy Evaluation Patient Details Name: Steven Steele MRN: [id number] DOB: Apr 06, 1961 Today's Date: 08/30/2015   History of Present Illness  Pt admitted with parasthesias and being treated for [facility]. No PMHx  Clinical Impression  Pt very pleasant in room with family. Pt reports gradual progression of symptoms over the last 4 days. Pt has assist of fiancee who works and other family. Pt with proximal greater than distal weakness and upper body weaker than lower. Pt noted to have decreased trunk control and strength with difficulty coughing. Pt educated for tranfers, HEP and progression. Hopeful pt will be able to return home with assist if current strength is maintained. Should pt decline further prior to D/C CIR may be warranted. Pt with above deficits and difficulty with transfers who will benefit from acute therapy to maximize function and strength to decrease burden of care.     Follow Up Recommendations Home health PT;Supervision for mobility/OOB    Equipment Recommendations  None recommended by PT    Recommendations for Other Services OT consult     Precautions / Restrictions Precautions Precautions: Fall      Mobility  Bed Mobility Overal bed mobility: Needs Assistance Bed Mobility: Rolling;Sidelying to Sit;Sit to Supine Rolling: Supervision Sidelying to sit: Mod assist   Sit to supine: Mod assist   General bed mobility comments: cues for sequence with assist to bring legs off of and onto bed, assist to elevate trunk from surface  Transfers Overall transfer level: Needs assistance   Transfers: Sit to/from Stand Sit to Stand: Min guard         General transfer comment: cues for hand placement and safety  Ambulation/Gait Ambulation/Gait assistance: Min guard Ambulation Distance (Feet): 500 Feet Assistive device: None Gait Pattern/deviations: Step-through pattern;Wide base of support   Gait velocity interpretation: Below normal speed for  age/gender General Gait Details: pt with steady gait once standing, unable to perform change in speed or head turns with gait. pt stated he feels once he started his speed he had to maintain due to [name] (Stroke Patients Only)       Balance Overall balance assessment: Needs assistance   Sitting balance-Leahy Scale: Good       Standing balance-Leahy Scale: Good                               Pertinent Vitals/Pain Pain Assessment: 0-10 Pain Score: 3  Pain Location: left scapula Pain Descriptors / Indicators: Aching Pain Intervention(s): Repositioned    Home Living Family/patient expects to be discharged to:: Private residence Living Arrangements: Spouse/significant other Available Help at Discharge: Family;Available PRN/intermittently Type of Home: House Home Access: Stairs to enter   Entrance Stairs-Number of Steps: 12 Home Layout: Two level Home Equipment: None      Prior Function Level of Independence: Independent               Hand Dominance        Extremity/Trunk Assessment   Upper Extremity Assessment: RUE deficits/detail;LUE deficits/detail RUE Deficits / Details: shoulder flexion 2-/5, shoulder abduction 2-/5, elbow flexion and extension 3/5, grip 4/5     LUE Deficits / Details: shoulder flexion 2-/5, shoulder abduction 2-/5, elbow flexion and extension 3/5, grip 4/5   Lower Extremity Assessment: RLE deficits/detail;LLE deficits/detail RLE Deficits / Details: hip flexion 2+/5, knee extension 4/5,  knee flexion 4+/5, dorsiflexion 5/5 LLE Deficits / Details: hip flexion 2+/5, knee extension 3+/5, knee flexion 4/5, dorsiflexion 5/5  Cervical / Trunk Assessment: Other exceptions (posture WFL. decreased trunk control with transfers)  Communication   Communication: No difficulties  Cognition Arousal/Alertness: Awake/alert Behavior During Therapy: WFL for tasks  assessed/performed Overall Cognitive Status: Within Functional Limits for tasks assessed                      General Comments      Exercises General Exercises - Lower Extremity Long Arc Quad: AROM;Both;5 reps;Seated Hip Flexion/Marching: AAROM;Both;5 reps;Seated      Assessment/Plan    PT Assessment Patient needs continued PT services  PT Diagnosis Difficulty walking;Generalized weakness   PT Problem List Decreased strength;Decreased activity tolerance;Decreased mobility;Decreased knowledge of use of DME  PT Treatment Interventions Gait training;DME instruction;Stair training;Functional mobility training;Therapeutic activities;Therapeutic exercise;Balance training;Patient/family education   PT Goals (Current goals can be found in the Care Plan section) Acute Rehab PT Goals Patient Stated Goal: return to doing pushups PT Goal Formulation: With patient/family Time For Goal Achievement: 09/06/15 Potential to Achieve Goals: Good    Frequency Min 4X/week   Barriers to discharge Decreased caregiver support      Co-evaluation               End of Session Equipment Utilized During Treatment: Gait belt Activity Tolerance: Patient tolerated treatment well Patient left: in chair;with call bell/phone within reach;with nursing/sitter in room;with family/visitor present           Time: [id number] PT Time Calculation (min) (ACUTE ONLY): 22 min   Charges:   PT Evaluation $PT Eval Moderate Complexity: 1 Procedure     PT G CodesDelorse Lek:        Tabor, Brandonlee Navis Beth 08/30/2015, 1:43 PM Delaney MeigsMaija Tabor Trumaine Wimer, PT [phone]

## 2015-08-30 NOTE — Progress Notes (Signed)
OT Cancellation Note  Patient Details Name: Steven LemonRobert Pica MRN: [id number] DOB: [birth year]   Cancelled Treatment:    Reason Eval/Treat Not Completed: Other (comment) (IV team in with patient). Will check back later for OT eval/treat.   Edwin CapPatricia Keonda Dow , MS, OTR/L, CLT Pager: [phone]  08/30/2015, 3:12 PM

## 2015-08-30 NOTE — Progress Notes (Signed)
[facility] PROGRESS NOTE  Steven Steele ZOX:[id number] DOB: 12/01/1960 DOA: 08/28/2015 PCP: Farris Has, MD  Length of stay: 1   Assessment/Plan: Principal Problem:   Paresthesias Active Problems:   Hand weakness   Paresthesia   Brief summary 55 y.o. male without significant past medical history, comes in accompanied by his wife for evaluation of the aforementioned symptoms.Stated that never had similar symptoms, but 3 days ago developed acute onset of numbness in the left fingertips that subsequently traveled to the right hand-toes " and eventually moved to the whole body" [place] Spaniel that he feels weak all over, " with a strange sensation of pins and needless all over my body'. Steven Steele said that the generalized weakness is so prominent that is hard for him to walk. In addition, he tells me that it is hard to swallow and feels that he is not able to completely empty his bladder and bowels. Admitted for possible GBS, patient noticed a rash one month ago,  Assessment and plan  1. Paresthesias and weak hand grip:  This is new.Markedly hypoactive deep tendon reflexes upper and lower extremities bilaterally, ? upgoing left toe, Neurology consulted, and reconsulted this morning, discussed with Dr. Petra Kuba MRI brain and cervicothoracic spine was negative, lumbar puncture negative,  differential diagnosis potentially includes GBS, nutritional deficiencies. Given that it is not just diffuse, but uniform and symmetric, I find vasculitis doubtful. Hyperesthesia is prominent. Normal MRI rules out stroke and MS.  Check NIF, Check Lyme serologies Check Lyme antibodies and CSF Infectious workup was negative Check HIV antibody Consider infectious disease consult if unable to make a neurologic diagnosis      DVT prophylaxsis  Lovenox  Code Status:      Code Status Orders        Start     Ordered   08/29/15 0301  Full code   Continuous     08/29/15 0300       Family Communication: Discussed in detail with the patient, all imaging results, lab results explained to the patient   Disposition Plan: As per neurology      Consultants:  Neurology  Procedures:  Lumbar puncture  Antibiotics: Anti-infectives    None         HPI/Subjective: Patient feels like his body is shutting down," my body is fading away". Further, feels that his abdomen is blowing, can not move his bowels, can barely swallow, feels short of breath an very weak all over, can not even walk to the bathroom. Seen by neurology last night  Objective: Filed Vitals:   08/29/15 1721 08/29/15 2307 08/30/15 0223 08/30/15 0540  BP: 149/104 143/78 155/81 145/89  Pulse: 79 63 70 78  Temp: 98.3 F (36.8 C) 97.9 F (36.6 C) 97.7 F (36.5 C) 97.7 F (36.5 C)  TempSrc: Oral Oral Oral Oral  Resp: 16 18 18 18   Height:      Weight:      SpO2: 99% 97% 98% 97%    Intake/Output Summary (Last 24 hours) at 08/30/15 8119 Last data filed at 08/29/15 2248  Gross per 24 hour  Intake      0 ml  Output    100 ml  Net   -100 ml    Exam:  General: No acute respiratory distress Lungs: Clear to auscultation bilaterally without wheezes or crackles Cardiovascular: Regular rate and rhythm without murmur gallop or rub normal S1 and S2 Abdomen: Nontender, nondistended, soft, bowel sounds positive, no rebound,  no ascites, no appreciable mass 5/5 lower extremities proximally and distally. 4/5 right biceps and weak grip bilaterally but offers poor effort on testing. Tone and bulk:normal tone throughout; no atrophy noted Sensory: Pinprick and light touch intact throughout, bilaterally Deep Tendon Reflexes:  Hypoactive biceps-triceps-knee jerk reflexes bilaterally    Data Review   Micro Results Recent Results (from the past 240 hour(s))  CSF culture     Status: None (Preliminary result)   Collection Time: 08/29/15 12:48 PM  Result Value Ref Range Status   Specimen  Description CSF  Final   Special Requests NONE  Final   Gram Stain CYTOSPIN SMEAR NO WBC SEEN NO ORGANISMS SEEN   Final   Culture PENDING  Incomplete   Report Status PENDING  Incomplete    Radiology Reports Steven Steele Wo Contrast  08/28/2015  CLINICAL DATA:  Developed LEFT hand hand numbness 3 days ago, ascending to entire body with generalized weakness and paresthesias. Difficulty walking and swallowing. EXAM: MRI HEAD WITHOUT AND WITH CONTRAST MRI CERVICAL SPINE WITHOUT AND WITH CONTRAST TECHNIQUE: Multiplanar, multiecho pulse sequences of the brain and surrounding structures, and cervical spine, to include the craniocervical junction and cervicothoracic junction, were obtained without and with intravenous contrast. CONTRAST:  20mL MULTIHANCE GADOBENATE DIMEGLUMINE 529 MG/ML IV SOLN 20 cc MultiHance COMPARISON:  None. FINDINGS: MRI HEAD FINDINGS The ventricles and sulci are normal for patient's age. No abnormal parenchymal signal, mass lesions, mass effect. No abnormal parenchymal enhancement. No reduced diffusion to suggest acute ischemia. No susceptibility artifact to suggest hemorrhage. No abnormal extra-axial fluid collections. No extra-axial masses nor leptomeningeal enhancement. Normal major intracranial vascular flow voids seen at the skull base. Ocular globes and orbital contents are nonsuspicious though not tailored for evaluation ; old LEFT medial orbital blowout fracture. No suspicious calvarial bone marrow signal. No abnormal sellar expansion. Craniocervical junction maintained. Mild fronto ethmoidal mucosal thickening without paranasal sinus air-fluid levels. Mastoid air cells are well aerated. MRI CERVICAL SPINE FINDINGS Cervical vertebral bodies intact and aligned with straightened cervical lordosis. Borderline congenital canal narrowing on the basis of foreshortened pedicles. Moderate to severe C5-6 disc height loss, moderate at C6-7 with moderate subacute to chronic discogenic endplate  changes. Mild-to-moderate C3-4 chronic discogenic endplate changes. No STIR signal abnormality to suggest acute osseous process. No abnormal osseous or intradiscal enhancement. Cervical spinal cord normal morphology and signal characteristics of are medullary junction is well though she 2 3 vomiting most caudal well visualized level. No abnormal cord, leptomeningeal or epidural enhancement. Prevertebral area pacer normal. Level by level evaluation: C2-3: No significant disc bulge. Mild facet arthropathy without canal stenosis or neural foraminal narrowing. C3-4: Annular bulging. Uncovertebral hypertrophy and mild facet arthropathy. No canal stenosis. Moderate to severe RIGHT, moderate LEFT neural foraminal narrowing. C4-5: Small broad-based disc bulge. Uncovertebral hypertrophy. Mild canal stenosis. Moderate RIGHT, moderate to severe LEFT neural foraminal narrowing. C5-6: Small broad-based disc bulge, small LEFT central to subarticular disc protrusion. Uncovertebral hypertrophy. Mild canal stenosis. Mild to moderate bilateral neural foraminal narrowing. C6-7: Moderate LEFT central disc protrusion, uncovertebral hypertrophy. Mild canal stenosis with slight LEFT ventral cord deformity. No neural foraminal narrowing. C7-T1: Small broad-based disc bulge, no canal stenosis. Mild bilateral neural foraminal narrowing. IMPRESSION: MRI BRAIN:  Normal MRI of the brain with and without contrast. MRI CERVICAL SPINE: Straightened cervical lordosis without acute fracture or malalignment. Normal contrast-enhanced MRI of the cervical spinal cord. Degenerative change of the cervical spine superimposed on borderline congenital canal narrowing. Mild canal stenosis C4-5  through C6-7. Neural foraminal narrowing C3-4 through C5-6, C7-T1: Moderate to severe on the RIGHT at C3-4 and on the LEFT at C4-5. Electronically Signed   By: Awilda Metro M.D.   On: 08/28/2015 23:21   Steven Cervical Spine W Wo Contrast  08/28/2015  CLINICAL DATA:   Developed LEFT hand hand numbness 3 days ago, ascending to entire body with generalized weakness and paresthesias. Difficulty walking and swallowing. EXAM: MRI HEAD WITHOUT AND WITH CONTRAST MRI CERVICAL SPINE WITHOUT AND WITH CONTRAST TECHNIQUE: Multiplanar, multiecho pulse sequences of the brain and surrounding structures, and cervical spine, to include the craniocervical junction and cervicothoracic junction, were obtained without and with intravenous contrast. CONTRAST:  20mL MULTIHANCE GADOBENATE DIMEGLUMINE 529 MG/ML IV SOLN 20 cc MultiHance COMPARISON:  None. FINDINGS: MRI HEAD FINDINGS The ventricles and sulci are normal for patient's age. No abnormal parenchymal signal, mass lesions, mass effect. No abnormal parenchymal enhancement. No reduced diffusion to suggest acute ischemia. No susceptibility artifact to suggest hemorrhage. No abnormal extra-axial fluid collections. No extra-axial masses nor leptomeningeal enhancement. Normal major intracranial vascular flow voids seen at the skull base. Ocular globes and orbital contents are nonsuspicious though not tailored for evaluation ; old LEFT medial orbital blowout fracture. No suspicious calvarial bone marrow signal. No abnormal sellar expansion. Craniocervical junction maintained. Mild fronto ethmoidal mucosal thickening without paranasal sinus air-fluid levels. Mastoid air cells are well aerated. MRI CERVICAL SPINE FINDINGS Cervical vertebral bodies intact and aligned with straightened cervical lordosis. Borderline congenital canal narrowing on the basis of foreshortened pedicles. Moderate to severe C5-6 disc height loss, moderate at C6-7 with moderate subacute to chronic discogenic endplate changes. Mild-to-moderate C3-4 chronic discogenic endplate changes. No STIR signal abnormality to suggest acute osseous process. No abnormal osseous or intradiscal enhancement. Cervical spinal cord normal morphology and signal characteristics of are medullary junction  is well though she 2 3 vomiting most caudal well visualized level. No abnormal cord, leptomeningeal or epidural enhancement. Prevertebral area pacer normal. Level by level evaluation: C2-3: No significant disc bulge. Mild facet arthropathy without canal stenosis or neural foraminal narrowing. C3-4: Annular bulging. Uncovertebral hypertrophy and mild facet arthropathy. No canal stenosis. Moderate to severe RIGHT, moderate LEFT neural foraminal narrowing. C4-5: Small broad-based disc bulge. Uncovertebral hypertrophy. Mild canal stenosis. Moderate RIGHT, moderate to severe LEFT neural foraminal narrowing. C5-6: Small broad-based disc bulge, small LEFT central to subarticular disc protrusion. Uncovertebral hypertrophy. Mild canal stenosis. Mild to moderate bilateral neural foraminal narrowing. C6-7: Moderate LEFT central disc protrusion, uncovertebral hypertrophy. Mild canal stenosis with slight LEFT ventral cord deformity. No neural foraminal narrowing. C7-T1: Small broad-based disc bulge, no canal stenosis. Mild bilateral neural foraminal narrowing. IMPRESSION: MRI BRAIN:  Normal MRI of the brain with and without contrast. MRI CERVICAL SPINE: Straightened cervical lordosis without acute fracture or malalignment. Normal contrast-enhanced MRI of the cervical spinal cord. Degenerative change of the cervical spine superimposed on borderline congenital canal narrowing. Mild canal stenosis C4-5 through C6-7. Neural foraminal narrowing C3-4 through C5-6, C7-T1: Moderate to severe on the RIGHT at C3-4 and on the LEFT at C4-5. Electronically Signed   By: Awilda Metro M.D.   On: 08/28/2015 23:21   Steven Thoracic Spine W Wo Contrast  08/28/2015  CLINICAL DATA:  Developed LEFT hand hand numbness 3 days ago, ascending to entire body with generalized weakness and paresthesias. Difficulty walking and swallowing. EXAM: MRI THORACIC WITHOUT AND WITH CONTRAST TECHNIQUE: Multiplanar and multiecho pulse sequences of the thoracic  spine were obtained without and with intravenous  contrast. CONTRAST:  20 cc MultiHance COMPARISON:  None. FINDINGS: Steven THORACIC SPINE FINDINGS Thoracic vertebral bodies are intact and aligned maintenance of thoracic kyphosis. Intervertebral disc morphology generally preserved with decreased T2 without disc compatible with mild desiccation. Multilevel mild chronic discogenic endplate changes without STIR signal abnormality to suggest acute osseous process. No abnormal osseous or intradiscal enhancement. Thoracic spinal cord is normal morphology and signal characteristics to the level of the conus medullaris which is partially imaged at T12-L1. No abnormal cord, leptomeningeal or epidural enhancement. Included prevertebral and paraspinal soft tissues are nonsuspicious. 2.2 cm nonenhancing cyst upper pole of RIGHT kidney. Mild lower thoracic facet arthropathy without significant disc bulge, canal stenosis or neural foraminal narrowing at any level. IMPRESSION: Early degenerative change of thoracic spine without acute fracture nor malalignment. No neurocompressive changes. Normal MRI of the thoracic spinal cord with and without contrast. Electronically Signed   By: Awilda Metro M.D.   On: 09-01-15 23:32   Dg Fluoro Guide Lumbar Puncture  08/29/2015  CLINICAL DATA:  Numbness of hands and feet. Hypersensitivity to pain. EXAM: DIAGNOSTIC LUMBAR PUNCTURE UNDER FLUOROSCOPIC GUIDANCE FLUOROSCOPY TIME:  Fluoroscopy Time (in minutes and seconds): 48 seconds Number of Acquired Images:  2 PROCEDURE: Informed consent was obtained from the patient prior to the procedure, including potential complications of headache, allergy, and pain. With the patient prone, the lower back was prepped with Betadine. 1% Lidocaine was used for local anesthesia. Lumbar puncture was performed at the L3-L4 level using a 22 gauge needle with return of clear CSF with an opening pressure of 14 cm water. Fifteen ml of CSF were obtained for  laboratory studies. The patient tolerated the procedure well and there were no apparent complications. IMPRESSION: Successful lumbar puncture, with normal opening pressure. CSF fluid was sent to microbiology as requested. Electronically Signed   By: Ted Mcalpine M.D.   On: 08/29/2015 13:28     CBC  Recent Labs Lab September 01, 2015 1819  WBC 10.7*  HGB 16.6  HCT 48.6  PLT 131*  MCV 86.3  MCH 29.5  MCHC 34.2  RDW 12.8  LYMPHSABS 1.4  MONOABS 0.3  EOSABS 0.0  BASOSABS 0.0    Chemistries   Recent Labs Lab 2015/09/01 1819  NA 143  K 4.1  CL 106  CO2 26  GLUCOSE 111*  BUN 16  CREATININE 0.85  CALCIUM 10.3  AST 24  ALT 28  ALKPHOS 95  BILITOT 0.7   ------------------------------------------------------------------------------------------------------------------ estimated creatinine clearance is 114 mL/min (by C-G formula based on Cr of 0.85). ------------------------------------------------------------------------------------------------------------------ No results for input(s): HGBA1C in the last 72 hours. ------------------------------------------------------------------------------------------------------------------ No results for input(s): CHOL, HDL, LDLCALC, TRIG, CHOLHDL, LDLDIRECT in the last 72 hours. ------------------------------------------------------------------------------------------------------------------ No results for input(s): TSH, T4TOTAL, T3FREE, THYROIDAB in the last 72 hours.  Invalid input(s): FREET3 ------------------------------------------------------------------------------------------------------------------ No results for input(s): VITAMINB12, FOLATE, FERRITIN, TIBC, IRON, RETICCTPCT in the last 72 hours.  Coagulation profile No results for input(s): INR, PROTIME in the last 168 hours.  No results for input(s): DDIMER in the last 72 hours.  Cardiac Enzymes No results for input(s): CKMB, TROPONINI, MYOGLOBIN in the last 168  hours.  Invalid input(s): CK ------------------------------------------------------------------------------------------------------------------ Invalid input(s): POCBNP   CBG: No results for input(s): GLUCAP in the last 168 hours.     Studies: Steven Lodema Pilot Contrast  09/01/15  CLINICAL DATA:  Developed LEFT hand hand numbness 3 days ago, ascending to entire body with generalized weakness and paresthesias. Difficulty walking and swallowing. EXAM: MRI HEAD WITHOUT  AND WITH CONTRAST MRI CERVICAL SPINE WITHOUT AND WITH CONTRAST TECHNIQUE: Multiplanar, multiecho pulse sequences of the brain and surrounding structures, and cervical spine, to include the craniocervical junction and cervicothoracic junction, were obtained without and with intravenous contrast. CONTRAST:  20mL MULTIHANCE GADOBENATE DIMEGLUMINE 529 MG/ML IV SOLN 20 cc MultiHance COMPARISON:  None. FINDINGS: MRI HEAD FINDINGS The ventricles and sulci are normal for patient's age. No abnormal parenchymal signal, mass lesions, mass effect. No abnormal parenchymal enhancement. No reduced diffusion to suggest acute ischemia. No susceptibility artifact to suggest hemorrhage. No abnormal extra-axial fluid collections. No extra-axial masses nor leptomeningeal enhancement. Normal major intracranial vascular flow voids seen at the skull base. Ocular globes and orbital contents are nonsuspicious though not tailored for evaluation ; old LEFT medial orbital blowout fracture. No suspicious calvarial bone marrow signal. No abnormal sellar expansion. Craniocervical junction maintained. Mild fronto ethmoidal mucosal thickening without paranasal sinus air-fluid levels. Mastoid air cells are well aerated. MRI CERVICAL SPINE FINDINGS Cervical vertebral bodies intact and aligned with straightened cervical lordosis. Borderline congenital canal narrowing on the basis of foreshortened pedicles. Moderate to severe C5-6 disc height loss, moderate at C6-7 with moderate  subacute to chronic discogenic endplate changes. Mild-to-moderate C3-4 chronic discogenic endplate changes. No STIR signal abnormality to suggest acute osseous process. No abnormal osseous or intradiscal enhancement. Cervical spinal cord normal morphology and signal characteristics of are medullary junction is well though she 2 3 vomiting most caudal well visualized level. No abnormal cord, leptomeningeal or epidural enhancement. Prevertebral area pacer normal. Level by level evaluation: C2-3: No significant disc bulge. Mild facet arthropathy without canal stenosis or neural foraminal narrowing. C3-4: Annular bulging. Uncovertebral hypertrophy and mild facet arthropathy. No canal stenosis. Moderate to severe RIGHT, moderate LEFT neural foraminal narrowing. C4-5: Small broad-based disc bulge. Uncovertebral hypertrophy. Mild canal stenosis. Moderate RIGHT, moderate to severe LEFT neural foraminal narrowing. C5-6: Small broad-based disc bulge, small LEFT central to subarticular disc protrusion. Uncovertebral hypertrophy. Mild canal stenosis. Mild to moderate bilateral neural foraminal narrowing. C6-7: Moderate LEFT central disc protrusion, uncovertebral hypertrophy. Mild canal stenosis with slight LEFT ventral cord deformity. No neural foraminal narrowing. C7-T1: Small broad-based disc bulge, no canal stenosis. Mild bilateral neural foraminal narrowing. IMPRESSION: MRI BRAIN:  Normal MRI of the brain with and without contrast. MRI CERVICAL SPINE: Straightened cervical lordosis without acute fracture or malalignment. Normal contrast-enhanced MRI of the cervical spinal cord. Degenerative change of the cervical spine superimposed on borderline congenital canal narrowing. Mild canal stenosis C4-5 through C6-7. Neural foraminal narrowing C3-4 through C5-6, C7-T1: Moderate to severe on the RIGHT at C3-4 and on the LEFT at C4-5. Electronically Signed   By: Awilda Metro M.D.   On: 08/28/2015 23:21   Steven Cervical Spine W  Wo Contrast  08/28/2015  CLINICAL DATA:  Developed LEFT hand hand numbness 3 days ago, ascending to entire body with generalized weakness and paresthesias. Difficulty walking and swallowing. EXAM: MRI HEAD WITHOUT AND WITH CONTRAST MRI CERVICAL SPINE WITHOUT AND WITH CONTRAST TECHNIQUE: Multiplanar, multiecho pulse sequences of the brain and surrounding structures, and cervical spine, to include the craniocervical junction and cervicothoracic junction, were obtained without and with intravenous contrast. CONTRAST:  20mL MULTIHANCE GADOBENATE DIMEGLUMINE 529 MG/ML IV SOLN 20 cc MultiHance COMPARISON:  None. FINDINGS: MRI HEAD FINDINGS The ventricles and sulci are normal for patient's age. No abnormal parenchymal signal, mass lesions, mass effect. No abnormal parenchymal enhancement. No reduced diffusion to suggest acute ischemia. No susceptibility artifact to suggest hemorrhage. No abnormal extra-axial  fluid collections. No extra-axial masses nor leptomeningeal enhancement. Normal major intracranial vascular flow voids seen at the skull base. Ocular globes and orbital contents are nonsuspicious though not tailored for evaluation ; old LEFT medial orbital blowout fracture. No suspicious calvarial bone marrow signal. No abnormal sellar expansion. Craniocervical junction maintained. Mild fronto ethmoidal mucosal thickening without paranasal sinus air-fluid levels. Mastoid air cells are well aerated. MRI CERVICAL SPINE FINDINGS Cervical vertebral bodies intact and aligned with straightened cervical lordosis. Borderline congenital canal narrowing on the basis of foreshortened pedicles. Moderate to severe C5-6 disc height loss, moderate at C6-7 with moderate subacute to chronic discogenic endplate changes. Mild-to-moderate C3-4 chronic discogenic endplate changes. No STIR signal abnormality to suggest acute osseous process. No abnormal osseous or intradiscal enhancement. Cervical spinal cord normal morphology and signal  characteristics of are medullary junction is well though she 2 3 vomiting most caudal well visualized level. No abnormal cord, leptomeningeal or epidural enhancement. Prevertebral area pacer normal. Level by level evaluation: C2-3: No significant disc bulge. Mild facet arthropathy without canal stenosis or neural foraminal narrowing. C3-4: Annular bulging. Uncovertebral hypertrophy and mild facet arthropathy. No canal stenosis. Moderate to severe RIGHT, moderate LEFT neural foraminal narrowing. C4-5: Small broad-based disc bulge. Uncovertebral hypertrophy. Mild canal stenosis. Moderate RIGHT, moderate to severe LEFT neural foraminal narrowing. C5-6: Small broad-based disc bulge, small LEFT central to subarticular disc protrusion. Uncovertebral hypertrophy. Mild canal stenosis. Mild to moderate bilateral neural foraminal narrowing. C6-7: Moderate LEFT central disc protrusion, uncovertebral hypertrophy. Mild canal stenosis with slight LEFT ventral cord deformity. No neural foraminal narrowing. C7-T1: Small broad-based disc bulge, no canal stenosis. Mild bilateral neural foraminal narrowing. IMPRESSION: MRI BRAIN:  Normal MRI of the brain with and without contrast. MRI CERVICAL SPINE: Straightened cervical lordosis without acute fracture or malalignment. Normal contrast-enhanced MRI of the cervical spinal cord. Degenerative change of the cervical spine superimposed on borderline congenital canal narrowing. Mild canal stenosis C4-5 through C6-7. Neural foraminal narrowing C3-4 through C5-6, C7-T1: Moderate to severe on the RIGHT at C3-4 and on the LEFT at C4-5. Electronically Signed   By: Awilda Metro M.D.   On: 08/28/2015 23:21   Steven Thoracic Spine W Wo Contrast  08/28/2015  CLINICAL DATA:  Developed LEFT hand hand numbness 3 days ago, ascending to entire body with generalized weakness and paresthesias. Difficulty walking and swallowing. EXAM: MRI THORACIC WITHOUT AND WITH CONTRAST TECHNIQUE: Multiplanar and  multiecho pulse sequences of the thoracic spine were obtained without and with intravenous contrast. CONTRAST:  20 cc MultiHance COMPARISON:  None. FINDINGS: Steven THORACIC SPINE FINDINGS Thoracic vertebral bodies are intact and aligned maintenance of thoracic kyphosis. Intervertebral disc morphology generally preserved with decreased T2 without disc compatible with mild desiccation. Multilevel mild chronic discogenic endplate changes without STIR signal abnormality to suggest acute osseous process. No abnormal osseous or intradiscal enhancement. Thoracic spinal cord is normal morphology and signal characteristics to the level of the conus medullaris which is partially imaged at T12-L1. No abnormal cord, leptomeningeal or epidural enhancement. Included prevertebral and paraspinal soft tissues are nonsuspicious. 2.2 cm nonenhancing cyst upper pole of RIGHT kidney. Mild lower thoracic facet arthropathy without significant disc bulge, canal stenosis or neural foraminal narrowing at any level. IMPRESSION: Early degenerative change of thoracic spine without acute fracture nor malalignment. No neurocompressive changes. Normal MRI of the thoracic spinal cord with and without contrast. Electronically Signed   By: Awilda Metro M.D.   On: 08/28/2015 23:32   Dg Fluoro Guide Lumbar Puncture  08/29/2015  CLINICAL DATA:  Numbness of hands and feet. Hypersensitivity to pain. EXAM: DIAGNOSTIC LUMBAR PUNCTURE UNDER FLUOROSCOPIC GUIDANCE FLUOROSCOPY TIME:  Fluoroscopy Time (in minutes and seconds): 48 seconds Number of Acquired Images:  2 PROCEDURE: Informed consent was obtained from the patient prior to the procedure, including potential complications of headache, allergy, and pain. With the patient prone, the lower back was prepped with Betadine. 1% Lidocaine was used for local anesthesia. Lumbar puncture was performed at the L3-L4 level using a 22 gauge needle with return of clear CSF with an opening pressure of 14 cm water.  Fifteen ml of CSF were obtained for laboratory studies. The patient tolerated the procedure well and there were no apparent complications. IMPRESSION: Successful lumbar puncture, with normal opening pressure. CSF fluid was sent to microbiology as requested. Electronically Signed   By: Ted Mcalpineobrinka  Dimitrova M.D.   On: 08/29/2015 13:28      No results found for: HGBA1C Lab Results  Component Value Date   CREATININE 0.85 08/28/2015       Scheduled Meds: . docusate sodium  200 mg Oral BID  . LORazepam  1 mg Oral Once  . polyethylene glycol  17 g Oral Daily   Continuous Infusions:   Principal Problem:   Paresthesias Active Problems:   Hand weakness   Paresthesia    Time spent: 45 minutes   [facility] CenterBROL,Steven Steele  [facility] Pager [phone]. If 7PM-7AM, please contact night-coverage at [email or web address], password [facility] 08/30/2015, 9:23 AM  LOS: 1 day

## 2015-08-30 NOTE — Progress Notes (Signed)
NIF = -60

## 2015-08-30 NOTE — Care Management Note (Signed)
Case Management Note  Patient Details  Name: Steven LemonRobert Mintzer MRN: [id number] Date of Birth: [birth year]  Subjective/Objective:      Patient admitted with paresthesias. Pt having weakness all over and difficulty swallowing. He is from home with his girlfriend. [facility]               Action/Plan: Patient to start on IVIG for 5 day course. Await PT/OT recommendations. CM will continue to follow for discharge needs.   Expected Discharge Date:                  Expected Discharge Plan:     In-House Referral:     Discharge planning Services     Post Acute Care Choice:    Choice offered to:     DME Arranged:    DME Agency:     HH Arranged:    HH Agency:     Status of Service:  In process, will continue to follow  Medicare Important Message Given:    Date Medicare IM Given:    Medicare IM give by:    Date Additional Medicare IM Given:    Additional Medicare Important Message give by:     If discussed at Long Length of Stay Meetings, dates discussed:    Additional Comments:  Kermit BaloKelli F Delara Shepheard, RN 08/30/2015, 11:40 AM

## 2015-08-31 ENCOUNTER — Inpatient Hospital Stay (HOSPITAL_COMMUNITY): Payer: Federal, State, Local not specified - PPO

## 2015-08-31 LAB — CBC
HCT: 45.3 % (ref 39.0–52.0)
Hemoglobin: 15.5 g/dL (ref 13.0–17.0)
MCH: 29.5 pg (ref 26.0–34.0)
MCHC: 34.2 g/dL (ref 30.0–36.0)
MCV: 86.3 fL (ref 78.0–100.0)
PLATELETS: 185 10*3/uL (ref 150–400)
RBC: 5.25 MIL/uL (ref 4.22–5.81)
RDW: 13 % (ref 11.5–15.5)
WBC: 7.1 10*3/uL (ref 4.0–10.5)

## 2015-08-31 LAB — COMPREHENSIVE METABOLIC PANEL
ALBUMIN: 3.8 g/dL (ref 3.5–5.0)
ALT: 28 U/L (ref 17–63)
ANION GAP: 11 (ref 5–15)
AST: 25 U/L (ref 15–41)
Alkaline Phosphatase: 73 U/L (ref 38–126)
BUN: 15 mg/dL (ref 6–20)
CALCIUM: 9.1 mg/dL (ref 8.9–10.3)
CO2: 26 mmol/L (ref 22–32)
Chloride: 103 mmol/L (ref 101–111)
Creatinine, Ser: 0.9 mg/dL (ref 0.61–1.24)
GFR calc non Af Amer: >60 mL/min (ref >60)
GLUCOSE: 109 mg/dL — AB (ref 65–99)
Potassium: 3.5 mmol/L (ref 3.5–5.1)
SODIUM: 140 mmol/L (ref 135–145)
TOTAL PROTEIN: 7.9 g/dL (ref 6.5–8.1)
Total Bilirubin: 1 mg/dL (ref 0.3–1.2)

## 2015-08-31 LAB — HEAVY METALS, BLOOD
Arsenic: 4 ug/L (ref 2–23)
LEAD: 1 ug/dL (ref 0–19)
MERCURY: NOT DETECTED ug/L (ref 0.0–14.9)

## 2015-08-31 LAB — HIV ANTIBODY (ROUTINE TESTING W REFLEX): HIV Screen 4th Generation wRfx: NONREACTIVE

## 2015-08-31 NOTE — Progress Notes (Signed)
Subjective: Reports subjective improvement.   Exam: Filed Vitals:   08/31/15 0701 08/31/15 0921  BP: 151/97 151/101  Pulse: 79 92  Temp: 97.6 F (36.4 C) 97.9 F (36.6 C)  Resp: 20 20   Gen: In bed, NAD Resp: non-labored breathing, no acute distress Abd: soft, nt  Neuro: MS: Awake, alert, oriented.  ZO:XWRUECN:PERRL, EOMI, palate elevates well bilaterally Motor: 4-/5 proximally in the arms and legs.  Sensory:decreased in teh distal arms to cold.  DTR: Absent in the patellae, biceps, and ankle bilaterally.   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 Though he does not have an elevated protein in his CSF, this can be negative in the first week of symptoms. With consistent history and physical exam and no evidence for other causes by history or LP, I have started treating as guillian-barre.   Serum lyme titers are negative and I think that this is very unlikely to be the cause at this point.   Recommendations: 1) IVIG total of 2g/kg divided by 5 days, for total 400mg  per day. Today is day 2 2) PT,ST 4) NIFs 5) telemetry 6) will continue to follow.   Ritta SlotMcNeill Cataleya Cristina, MD [facility] [phone]  If 7pm- 7am, please page neurology on call as listed in AMION.

## 2015-08-31 NOTE — Progress Notes (Signed)
[facility] PROGRESS NOTE  Steven Steele ZOX:[id number] DOB: January 26, 1961 DOA: 08/28/2015 PCP: Steven Has, MD  Length of stay: 2   Assessment/Plan: Principal Problem:   Paresthesias Active Problems:   Hand weakness   Paresthesia   Brief summary 55 y.o. male without significant past medical history, comes in accompanied by his wife for evaluation of the aforementioned symptoms.Stated that never had similar symptoms, but 3 days ago developed acute onset of numbness in the left fingertips that subsequently traveled to the right hand-toes " and eventually moved to the whole body" [place] Spaniel that he feels weak all over, " with a strange sensation of pins and needless all over my body'. Steven Steele said that the generalized weakness is so prominent that is hard for him to walk. In addition, he tells me that it is hard to swallow and feels that he is not able to completely empty his bladder and bowels. Admitted for possible GBS, patient noticed a rash one month ago,  Assessment and plan  1. Paresthesias and weak hand grip:  This is new.Markedly hypoactive deep tendon reflexes upper and lower extremities bilaterally, ? upgoing left toe, Neurology  following, Steven Steele. symptoms most consistent with guillian-barre syndrome MRI brain and cervicothoracic spine was negative, lumbar puncture negative, differential diagnosis potentially includes GBS, nutritional deficiencies.  Normal MRI rules out stroke and MS.  Status post LP Though he does not have an elevated protein in his CSF, this can be negative in the first week of symptoms Continue NIF, Negative Lyme serologies Infectious workup was negative HIV antibody pending Continue  IVIG    Today is day 2 Speech-mechanical soft diet, thin liquids PT-recommend home health  2. Erythema around the IV site Possible superficial thrombophlebitis Check venous Doppler to rule out DVT Switch IV sites  DVT prophylaxsis   Lovenox  Code Status:      Code Status Orders        Start     Ordered   08/29/15 0301  Full code   Continuous     08/29/15 0300      Family Communication: Discussed in detail with the patient, all imaging results, lab results explained to the patient   Disposition Plan: As per neurology      Consultants:  Neurology  Procedures:  Lumbar puncture  Antibiotics: Anti-infectives    None         HPI/Subjective: Patient concerned about the IV site in his left arm, strength is improving   Objective: Filed Vitals:   08/30/15 2150 08/31/15 0147 08/31/15 0701 08/31/15 0921  BP: 156/84 143/97 151/97 151/101  Pulse: 68 81 79 92  Temp: 98 F (36.7 C) 97.8 F (36.6 C) 97.6 F (36.4 C) 97.9 F (36.6 C)  TempSrc: Oral Oral Oral Oral  Resp: 20 20 20 20   Height:      Weight:      SpO2: 96% 98% 99% 98%    Intake/Output Summary (Last 24 hours) at 08/31/15 0945 Last data filed at 08/31/15 0856  Gross per 24 hour  Intake    840 ml  Output    250 ml  Net    590 ml    Exam:  General: No acute respiratory distress Lungs: Clear to auscultation bilaterally without wheezes or crackles Cardiovascular: Regular rate and rhythm without murmur gallop or rub normal S1 and S2 Abdomen: Nontender, nondistended, soft, bowel sounds positive, no rebound, no ascites, no appreciable mass 5/5 lower extremities proximally and distally.  4/5 right biceps and weak grip bilaterally but offers poor effort on testing. Tone and bulk:normal tone throughout; no atrophy noted Sensory: Pinprick and light touch intact throughout, bilaterally Deep Tendon Reflexes:  Hypoactive biceps-triceps-knee jerk reflexes bilaterally  mild increase in erythema in the left arm at the IV site   Data Review   Micro Results Recent Results (from the past 240 hour(s))  CSF culture     Status: None (Preliminary result)   Collection Time: 08/29/15 12:48 PM  Result Value Ref Range Status   Specimen  Description CSF  Final   Special Requests NONE  Final   Gram Stain CYTOSPIN SMEAR NO WBC SEEN NO ORGANISMS SEEN   Final   Culture NO GROWTH < 24 HOURS  Final   Report Status PENDING  Incomplete    Radiology Reports Steven Steele Wo Contrast  08/28/2015  CLINICAL DATA:  Developed LEFT hand hand numbness 3 days ago, ascending to entire body with generalized weakness and paresthesias. Difficulty walking and swallowing. EXAM: MRI HEAD WITHOUT AND WITH CONTRAST MRI CERVICAL SPINE WITHOUT AND WITH CONTRAST TECHNIQUE: Multiplanar, multiecho pulse sequences of the brain and surrounding structures, and cervical spine, to include the craniocervical junction and cervicothoracic junction, were obtained without and with intravenous contrast. CONTRAST:  20mL MULTIHANCE GADOBENATE DIMEGLUMINE 529 MG/ML IV SOLN 20 cc MultiHance COMPARISON:  None. FINDINGS: MRI HEAD FINDINGS The ventricles and sulci are normal for patient's age. No abnormal parenchymal signal, mass lesions, mass effect. No abnormal parenchymal enhancement. No reduced diffusion to suggest acute ischemia. No susceptibility artifact to suggest hemorrhage. No abnormal extra-axial fluid collections. No extra-axial masses nor leptomeningeal enhancement. Normal major intracranial vascular flow voids seen at the skull base. Ocular globes and orbital contents are nonsuspicious though not tailored for evaluation ; old LEFT medial orbital blowout fracture. No suspicious calvarial bone marrow signal. No abnormal sellar expansion. Craniocervical junction maintained. Mild fronto ethmoidal mucosal thickening without paranasal sinus air-fluid levels. Mastoid air cells are well aerated. MRI CERVICAL SPINE FINDINGS Cervical vertebral bodies intact and aligned with straightened cervical lordosis. Borderline congenital canal narrowing on the basis of foreshortened pedicles. Moderate to severe C5-6 disc height loss, moderate at C6-7 with moderate subacute to chronic discogenic  endplate changes. Mild-to-moderate C3-4 chronic discogenic endplate changes. No STIR signal abnormality to suggest acute osseous process. No abnormal osseous or intradiscal enhancement. Cervical spinal cord normal morphology and signal characteristics of are medullary junction is well though she 2 3 vomiting most caudal well visualized level. No abnormal cord, leptomeningeal or epidural enhancement. Prevertebral area pacer normal. Level by level evaluation: C2-3: No significant disc bulge. Mild facet arthropathy without canal stenosis or neural foraminal narrowing. C3-4: Annular bulging. Uncovertebral hypertrophy and mild facet arthropathy. No canal stenosis. Moderate to severe RIGHT, moderate LEFT neural foraminal narrowing. C4-5: Small broad-based disc bulge. Uncovertebral hypertrophy. Mild canal stenosis. Moderate RIGHT, moderate to severe LEFT neural foraminal narrowing. C5-6: Small broad-based disc bulge, small LEFT central to subarticular disc protrusion. Uncovertebral hypertrophy. Mild canal stenosis. Mild to moderate bilateral neural foraminal narrowing. C6-7: Moderate LEFT central disc protrusion, uncovertebral hypertrophy. Mild canal stenosis with slight LEFT ventral cord deformity. No neural foraminal narrowing. C7-T1: Small broad-based disc bulge, no canal stenosis. Mild bilateral neural foraminal narrowing. IMPRESSION: MRI BRAIN:  Normal MRI of the brain with and without contrast. MRI CERVICAL SPINE: Straightened cervical lordosis without acute fracture or malalignment. Normal contrast-enhanced MRI of the cervical spinal cord. Degenerative change of the cervical spine superimposed on borderline congenital canal  narrowing. Mild canal stenosis C4-5 through C6-7. Neural foraminal narrowing C3-4 through C5-6, C7-T1: Moderate to severe on the RIGHT at C3-4 and on the LEFT at C4-5. Electronically Signed   By: Awilda Metro M.D.   On: 08/28/2015 23:21   Steven Cervical Spine W Wo Contrast  08/28/2015   CLINICAL DATA:  Developed LEFT hand hand numbness 3 days ago, ascending to entire body with generalized weakness and paresthesias. Difficulty walking and swallowing. EXAM: MRI HEAD WITHOUT AND WITH CONTRAST MRI CERVICAL SPINE WITHOUT AND WITH CONTRAST TECHNIQUE: Multiplanar, multiecho pulse sequences of the brain and surrounding structures, and cervical spine, to include the craniocervical junction and cervicothoracic junction, were obtained without and with intravenous contrast. CONTRAST:  20mL MULTIHANCE GADOBENATE DIMEGLUMINE 529 MG/ML IV SOLN 20 cc MultiHance COMPARISON:  None. FINDINGS: MRI HEAD FINDINGS The ventricles and sulci are normal for patient's age. No abnormal parenchymal signal, mass lesions, mass effect. No abnormal parenchymal enhancement. No reduced diffusion to suggest acute ischemia. No susceptibility artifact to suggest hemorrhage. No abnormal extra-axial fluid collections. No extra-axial masses nor leptomeningeal enhancement. Normal major intracranial vascular flow voids seen at the skull base. Ocular globes and orbital contents are nonsuspicious though not tailored for evaluation ; old LEFT medial orbital blowout fracture. No suspicious calvarial bone marrow signal. No abnormal sellar expansion. Craniocervical junction maintained. Mild fronto ethmoidal mucosal thickening without paranasal sinus air-fluid levels. Mastoid air cells are well aerated. MRI CERVICAL SPINE FINDINGS Cervical vertebral bodies intact and aligned with straightened cervical lordosis. Borderline congenital canal narrowing on the basis of foreshortened pedicles. Moderate to severe C5-6 disc height loss, moderate at C6-7 with moderate subacute to chronic discogenic endplate changes. Mild-to-moderate C3-4 chronic discogenic endplate changes. No STIR signal abnormality to suggest acute osseous process. No abnormal osseous or intradiscal enhancement. Cervical spinal cord normal morphology and signal characteristics of are  medullary junction is well though she 2 3 vomiting most caudal well visualized level. No abnormal cord, leptomeningeal or epidural enhancement. Prevertebral area pacer normal. Level by level evaluation: C2-3: No significant disc bulge. Mild facet arthropathy without canal stenosis or neural foraminal narrowing. C3-4: Annular bulging. Uncovertebral hypertrophy and mild facet arthropathy. No canal stenosis. Moderate to severe RIGHT, moderate LEFT neural foraminal narrowing. C4-5: Small broad-based disc bulge. Uncovertebral hypertrophy. Mild canal stenosis. Moderate RIGHT, moderate to severe LEFT neural foraminal narrowing. C5-6: Small broad-based disc bulge, small LEFT central to subarticular disc protrusion. Uncovertebral hypertrophy. Mild canal stenosis. Mild to moderate bilateral neural foraminal narrowing. C6-7: Moderate LEFT central disc protrusion, uncovertebral hypertrophy. Mild canal stenosis with slight LEFT ventral cord deformity. No neural foraminal narrowing. C7-T1: Small broad-based disc bulge, no canal stenosis. Mild bilateral neural foraminal narrowing. IMPRESSION: MRI BRAIN:  Normal MRI of the brain with and without contrast. MRI CERVICAL SPINE: Straightened cervical lordosis without acute fracture or malalignment. Normal contrast-enhanced MRI of the cervical spinal cord. Degenerative change of the cervical spine superimposed on borderline congenital canal narrowing. Mild canal stenosis C4-5 through C6-7. Neural foraminal narrowing C3-4 through C5-6, C7-T1: Moderate to severe on the RIGHT at C3-4 and on the LEFT at C4-5. Electronically Signed   By: Awilda Metro M.D.   On: 08/28/2015 23:21   Steven Thoracic Spine W Wo Contrast  08/28/2015  CLINICAL DATA:  Developed LEFT hand hand numbness 3 days ago, ascending to entire body with generalized weakness and paresthesias. Difficulty walking and swallowing. EXAM: MRI THORACIC WITHOUT AND WITH CONTRAST TECHNIQUE: Multiplanar and multiecho pulse sequences  of the thoracic spine were  obtained without and with intravenous contrast. CONTRAST:  20 cc MultiHance COMPARISON:  None. FINDINGS: Steven THORACIC SPINE FINDINGS Thoracic vertebral bodies are intact and aligned maintenance of thoracic kyphosis. Intervertebral disc morphology generally preserved with decreased T2 without disc compatible with mild desiccation. Multilevel mild chronic discogenic endplate changes without STIR signal abnormality to suggest acute osseous process. No abnormal osseous or intradiscal enhancement. Thoracic spinal cord is normal morphology and signal characteristics to the level of the conus medullaris which is partially imaged at T12-L1. No abnormal cord, leptomeningeal or epidural enhancement. Included prevertebral and paraspinal soft tissues are nonsuspicious. 2.2 cm nonenhancing cyst upper pole of RIGHT kidney. Mild lower thoracic facet arthropathy without significant disc bulge, canal stenosis or neural foraminal narrowing at any level. IMPRESSION: Early degenerative change of thoracic spine without acute fracture nor malalignment. No neurocompressive changes. Normal MRI of the thoracic spinal cord with and without contrast. Electronically Signed   By: Awilda Metroourtnay  Bloomer M.D.   On: 08/28/2015 23:32   Dg Fluoro Guide Lumbar Puncture  08/29/2015  CLINICAL DATA:  Numbness of hands and feet. Hypersensitivity to pain. EXAM: DIAGNOSTIC LUMBAR PUNCTURE UNDER FLUOROSCOPIC GUIDANCE FLUOROSCOPY TIME:  Fluoroscopy Time (in minutes and seconds): 48 seconds Number of Acquired Images:  2 PROCEDURE: Informed consent was obtained from the patient prior to the procedure, including potential complications of headache, allergy, and pain. With the patient prone, the lower back was prepped with Betadine. 1% Lidocaine was used for local anesthesia. Lumbar puncture was performed at the L3-L4 level using a 22 gauge needle with return of clear CSF with an opening pressure of 14 cm water. Fifteen ml of CSF were  obtained for laboratory studies. The patient tolerated the procedure well and there were no apparent complications. IMPRESSION: Successful lumbar puncture, with normal opening pressure. CSF fluid was sent to microbiology as requested. Electronically Signed   By: Ted Mcalpineobrinka  Dimitrova M.D.   On: 08/29/2015 13:28     CBC  Recent Labs Lab 08/28/15 1819 08/30/15 1024 08/31/15 0515  WBC 10.7* 9.7 7.1  HGB 16.6 16.5 15.5  HCT 48.6 49.0 45.3  PLT 131* 175 185  MCV 86.3 86.1 86.3  MCH 29.5 29.0 29.5  MCHC 34.2 33.7 34.2  RDW 12.8 12.9 13.0  LYMPHSABS 1.4 3.0  --   MONOABS 0.3 0.8  --   EOSABS 0.0 0.3  --   BASOSABS 0.0 0.0  --     Chemistries   Recent Labs Lab 08/28/15 1819 08/30/15 1024 08/31/15 0515  NA 143 141 140  K 4.1 3.6 3.5  CL 106 104 103  CO2 26 25 26   GLUCOSE 111* 101* 109*  BUN 16 21* 15  CREATININE 0.85 0.97 0.90  CALCIUM 10.3 10.3 9.1  AST 24 24 25   ALT 28 27 28   ALKPHOS 95 91 73  BILITOT 0.7 1.3* 1.0   ------------------------------------------------------------------------------------------------------------------ estimated creatinine clearance is 107.7 mL/min (by C-G formula based on Cr of 0.9). ------------------------------------------------------------------------------------------------------------------ No results for input(s): HGBA1C in the last 72 hours. ------------------------------------------------------------------------------------------------------------------ No results for input(s): CHOL, HDL, LDLCALC, TRIG, CHOLHDL, LDLDIRECT in the last 72 hours. ------------------------------------------------------------------------------------------------------------------ No results for input(s): TSH, T4TOTAL, T3FREE, THYROIDAB in the last 72 hours.  Invalid input(s): FREET3 ------------------------------------------------------------------------------------------------------------------ No results for input(s): VITAMINB12, FOLATE, FERRITIN, TIBC,  IRON, RETICCTPCT in the last 72 hours.  Coagulation profile No results for input(s): INR, PROTIME in the last 168 hours.  No results for input(s): DDIMER in the last 72 hours.  Cardiac Enzymes No results for input(s): CKMB,  TROPONINI, MYOGLOBIN in the last 168 hours.  Invalid input(s): CK ------------------------------------------------------------------------------------------------------------------ Invalid input(s): POCBNP   CBG: No results for input(s): GLUCAP in the last 168 hours.     Studies: Dg Fluoro Guide Lumbar Puncture  08/29/2015  CLINICAL DATA:  Numbness of hands and feet. Hypersensitivity to pain. EXAM: DIAGNOSTIC LUMBAR PUNCTURE UNDER FLUOROSCOPIC GUIDANCE FLUOROSCOPY TIME:  Fluoroscopy Time (in minutes and seconds): 48 seconds Number of Acquired Images:  2 PROCEDURE: Informed consent was obtained from the patient prior to the procedure, including potential complications of headache, allergy, and pain. With the patient prone, the lower back was prepped with Betadine. 1% Lidocaine was used for local anesthesia. Lumbar puncture was performed at the L3-L4 level using a 22 gauge needle with return of clear CSF with an opening pressure of 14 cm water. Fifteen ml of CSF were obtained for laboratory studies. The patient tolerated the procedure well and there were no apparent complications. IMPRESSION: Successful lumbar puncture, with normal opening pressure. CSF fluid was sent to microbiology as requested. Electronically Signed   By: Ted Mcalpine M.D.   On: 08/29/2015 13:28      No results found for: HGBA1C Lab Results  Component Value Date   CREATININE 0.90 08/31/2015       Scheduled Meds: . docusate sodium  200 mg Oral BID  . enoxaparin (LOVENOX) injection  40 mg Subcutaneous Q24H  . Immune Globulin 10%  400 mg/kg Intravenous Q24 Hr x 5  . LORazepam  1 mg Oral Once  . polyethylene glycol  17 g Oral Daily   Continuous Infusions:   Principal Problem:    Paresthesias Active Problems:   Hand weakness   Paresthesia    Time spent: 45 minutes   [facility]  [facility] Pager [phone]. If 7PM-7AM, please contact night-coverage at [email or web address], password [facility] 08/31/2015, 9:45 AM  LOS: 2 days

## 2015-08-31 NOTE — Progress Notes (Signed)
VC 4L, NIF -60

## 2015-08-31 NOTE — Evaluation (Signed)
Occupational Therapy Evaluation Patient Details Name: Steven LemonRobert Steele MRN: [id number] DOB: February 02, 1961 Today's Date: 08/31/2015    History of Present Illness Pt admitted with parasthesias and [facility]. No PMHx   Clinical Impression   Pt was independent prior to admission. Presents with generalized weakness, proximal greater than distal and slightly greater in L UE vs RUE. Pt is performing ADL at a supervision to min assist level.  Educated pt in AROM exercise B UE to be performed throughout the day. Will attempt weighted exercise next visit.    Follow Up Recommendations  Home Health OT    Equipment Recommendations  None recommended by OT    Recommendations for Other Services       Precautions / Restrictions Precautions Precautions: Fall Restrictions Weight Bearing Restrictions: No      Mobility Bed Mobility Overal bed mobility: Modified Independent                Transfers Overall transfer level: Needs assistance   Transfers: Sit to/from Stand Sit to Stand: Supervision              Balance     Sitting balance-Leahy Scale: Good       Standing balance-Leahy Scale: Good                              ADL Overall ADL's : Needs assistance/impaired Eating/Feeding: Set up;Sitting Eating/Feeding Details (indicate cue type and reason): assist to open containers, pt must be very careful with hot beverages, uses 2 hands Grooming: Wash/dry hands;Supervision/safety;Standing   Upper Body Bathing: Minimal assitance;Sitting   Lower Body Bathing: Sit to/from stand;Minimal assistance   Upper Body Dressing : Set up;Sitting   Lower Body Dressing: Supervision/safety;Sit to/from stand Lower Body Dressing Details (indicate cue type and reason): able to cross foot over opposite knee Toilet Transfer: Supervision/safety Toilet Transfer Details (indicate cue type and reason): stood to urinate Toileting- ArchitectClothing Manipulation and  Hygiene: Supervision/safety       Functional mobility during ADLs: Supervision/safety General ADL Comments: Educated pt and performed AROM x 10 B UEs.     Vision     Perception     Praxis      Pertinent Vitals/Pain Pain Assessment: No/denies pain     Hand Dominance Right   Extremity/Trunk Assessment Upper Extremity Assessment Upper Extremity Assessment: RUE deficits/detail;LUE deficits/detail RUE Deficits / Details: 3+/5 shouler, 4-/5 biceps, 4+/5 triceps, 4/5 forearm, 4+/5 gross grasp RUE Sensation: decreased proprioception (intact pain, light touch) RUE Coordination: decreased fine motor;decreased gross motor LUE Deficits / Details: shoulder 3+/5, biceps 3+/5, triceps 4-/5, forearm 4-/5, gross grasp 4-/5 LUE Sensation: decreased proprioception (intact pain and light touch) LUE Coordination: decreased fine motor;decreased gross motor   Lower Extremity Assessment Lower Extremity Assessment: Defer to PT evaluation       Communication Communication Communication: No difficulties   Cognition Arousal/Alertness: Awake/alert Behavior During Therapy: WFL for tasks assessed/performed Overall Cognitive Status: Within Functional Limits for tasks assessed                     General Comments       Exercises       Shoulder Instructions      Home Living Family/patient expects to be discharged to:: Private residence Living Arrangements: Spouse/significant other Available Help at Discharge: Family;Available PRN/intermittently Type of Home: House Home Access: Stairs to enter Entrance Stairs-Number of Steps: 12   Home Layout: Two  level     Bathroom Shower/Tub: Producer, television/film/video: Standard     Home Equipment: None          Prior Functioning/Environment Level of Independence: Independent             OT Diagnosis: Generalized weakness   OT Problem List: Decreased strength;Impaired balance (sitting and/or standing);Decreased  coordination;Decreased knowledge of use of DME or AE;Impaired sensation;Impaired UE functional use   OT Treatment/Interventions: Self-care/ADL training;Therapeutic exercise;Therapeutic activities;Patient/family education    OT Goals(Current goals can be found in the care plan section) Acute Rehab OT Goals Patient Stated Goal: return to doing pushups OT Goal Formulation: With patient Time For Goal Achievement: 09/07/15 Potential to Achieve Goals: Good ADL Goals Pt Will Transfer to Toilet: Independently;ambulating Pt Will Perform Toileting - Clothing Manipulation and hygiene: Independently;sit to/from stand Pt Will Perform Tub/Shower Transfer: Shower transfer;Independently;ambulating Pt/caregiver will Perform Home Exercise Program: Increased strength;Both right and left upper extremity;With theraband;Independently Additional ADL Goal #1: Pt will demonstrate ability to open containers on meal tray, manipulate fasteners, and perform handwriting. Additional ADL Goal #2: Pt will be independent in showering. Additional ADL Goal #3: Pt will be independent in dressing.  OT Frequency: Min 3X/week   Barriers to D/C:            Co-evaluation              End of Session Nurse Communication:  (ok for coffee)  Activity Tolerance: Patient tolerated treatment well Patient left:  (in bathroom with RN and chaplain present)   Time: 1610-9604 OT Time Calculation (min): 21 min Charges:  OT General Charges $OT Visit: 1 Procedure OT Evaluation $OT Eval Low Complexity: 1 Procedure G-Codes:    Evern Bio 08/31/2015, 10:30 AM

## 2015-08-31 NOTE — Progress Notes (Signed)
   08/31/15 1008  Clinical Encounter Type  Visited With Patient;Health care provider  Visit Type Initial;Spiritual support  Referral From Nurse;Patient  Spiritual Encounters  Spiritual Needs Ritual;Prayer   Chaplain responded to a patient's request for a priest. Lunette StandsChaplain sought clarification from the patient, and patient indicated he is wanting a priest from [place] [facility] [place] to come and offer confession and Eucharist to him. Chaplain called the parish, left a message, and will follow-up tomorrow to make sure the parish has received the message. Chaplain also offered prayer with the patient. Chaplain support available as needed.   Alda PonderAdam M Ignacio Lowder, Chaplain 08/31/2015 10:14 AM

## 2015-08-31 NOTE — Progress Notes (Signed)
Speech Language Pathology Treatment: Dysphagia  Patient Details Name: Steven LemonRobert Gruen MRN: [id number] DOB: 1961-05-20 Today's Date: 08/31/2015 Time: [id number] SLP Time Calculation (min) (ACUTE ONLY): 8 min  Assessment / Plan / Recommendation Clinical Impression  Pt reports subtle improvements in swallowing today.  Fewer episodes of coughing associated with eating.  Persisting sense of pharyngeal residue.  Reviewed s/s to monitor and basic precautions.  Pt continued to demonstrate effortful/deliberate swallow but no overt s/s of aspiration.  CN function normal; phonation is louder today.  SLP will f/u x1 more to ensure progress/adequate toleration, then will sign off.  Pt in agreement.    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  Pt reports difficulty swallowing.      SLP Plan  Continue with current plan of care     Recommendations  Diet recommendations: Dysphagia 3 (mechanical soft);Thin liquid Liquids provided via: Straw Medication Administration: Whole meds with liquid Supervision: Patient able to self feed Compensations: Slow rate;Small sips/bites              Oral Care Recommendations: Oral care BID Follow up Recommendations: None Plan: Continue with current plan of care   Blenda MountsCouture, Zamantha Strebel Laurice 08/31/2015, 10:50 AM

## 2015-08-31 NOTE — Progress Notes (Signed)
VC 3.4L, NIF -60 cm/H2O

## 2015-09-01 ENCOUNTER — Inpatient Hospital Stay (HOSPITAL_COMMUNITY): Payer: Federal, State, Local not specified - PPO

## 2015-09-01 DIAGNOSIS — L539 Erythematous condition, unspecified: Secondary | ICD-10-CM

## 2015-09-01 LAB — CSF CULTURE: CULTURE: NO GROWTH

## 2015-09-01 LAB — CSF CULTURE W GRAM STAIN

## 2015-09-01 LAB — OLIGOCLONAL BANDS, CSF + SERM

## 2015-09-01 MED ORDER — ACETAMINOPHEN 325 MG PO TABS
650.0000 mg | ORAL_TABLET | Freq: Every day | ORAL | Status: DC | PRN
  Administered 2015-09-02: 650 mg via ORAL
  Filled 2015-09-01: qty 2

## 2015-09-01 NOTE — Progress Notes (Signed)
OT Cancellation Note  Patient Details Name: Steven LemonRobert Steele MRN: [id number] DOB: 11-11-1960   Cancelled Treatment:    Reason Eval/Treat Not Completed: Patient at procedure or test/ unavailable. Pt having IVIG. Will return later if able.  [facility] Center[facility],HILLARY  Inri Sobieski, OTR/L  [phone] 09/01/2015 09/01/2015, 1:04 PM

## 2015-09-01 NOTE — Progress Notes (Signed)
   09/01/15 1512  Clinical Encounter Type  Visited With Patient  Visit Type Follow-up;Spiritual support  Spiritual Encounters  Spiritual Needs Ritual   Chaplain followed up with a patient, to ensure that a priest visited. Priest visited patient the other day. Patient wanted to ensure that he was able to receive a Primary school teacherucharistic minister, and chaplain helped facilitate that process. Chaplain support available as needed.   Alda PonderAdam M Yaremi Stahlman, Chaplain 09/01/2015 3:15 PM

## 2015-09-01 NOTE — Progress Notes (Signed)
*  PRELIMINARY RESULTS* Vascular Ultrasound Left upper extremity venous duplex has been completed.  Preliminary findings: no evidence of DVT. Superficial thrombosis noted in the left basilic vein, upper medial forearm.   Farrel DemarkJill Eunice, RDMS, RVT  09/01/2015, 8:58 AM

## 2015-09-01 NOTE — Progress Notes (Signed)
VC 3.5 x2, NIF -60. No distress noted.

## 2015-09-01 NOTE — Progress Notes (Signed)
Physical Therapy Treatment Patient Details Name: Steven Steele MRN: [id number] DOB: 1961/04/16 Today's Date: 09/01/2015    History of Present Illness Pt admitted with parasthesias and being treated for [facility]. No PMHx    PT Comments    Patient highly motivated and progressing well. Mobilizing on nursing unit at modified independent level (gait velocity slightly decr and wide base of support due to parasthesias and sense of imbalance). Progressed exercises (see below). Per MD, will remain in hospital for 2 more days for IV meds.   Follow Up Recommendations  Outpatient PT     Equipment Recommendations  None recommended by PT    Recommendations for Other Services       Precautions / Restrictions Precautions Precautions: Fall Restrictions Weight Bearing Restrictions: No    Mobility  Bed Mobility Overal bed mobility: Modified Independent             General bed mobility comments: incr time and effort due to trunk weakness  Transfers Overall transfer level: Independent                  Ambulation/Gait Ambulation/Gait assistance: Modified independent (Device/Increase time) Ambulation Distance (Feet): 200 Feet Assistive device: None Gait Pattern/deviations: Wide base of support;WFL(Within Functional Limits)   Gait velocity interpretation: at or above normal speed for age/gender General Gait Details: able to incr and decr velocity; perform head turns; wide base of support for incr support due to parasthesias bil feet make him feel unsteady   Stairs Stairs: Yes Stairs assistance: Independent Stair Management: No rails;Alternating pattern;Forwards Number of Stairs: 5    Wheelchair Mobility    Modified Rankin (Stroke Patients Only)       Balance     Sitting balance-Leahy Scale: Good       Standing balance-Leahy Scale: Good                      Cognition Arousal/Alertness: Awake/alert Behavior During Therapy: WFL for tasks  assessed/performed Overall Cognitive Status: Within Functional Limits for tasks assessed                      Exercises General Exercises - Lower Extremity Hip ABduction/ADduction: AROM;Right;5 reps;Sidelying;Other (comment) (also educ in standing at sink alternating for balance) Mini-Sqauts: AROM;Both;5 reps Other Exercises Other Exercises: supine tightening transverse abdominals x 5 with 3 sec hold Other Exercises: rotator cuff ROM vs resisting with opp UE with elbow at his side; scaption with thumbs pointing to ceiling; keep Lt UE below 90 if having pain (h/o previous subluxation Lt shoulder) Other Exercises: wall pushups    General Comments General comments (skin integrity, edema, etc.): Educated in risk of "overdoing" activity/exercises and importance of listening to his body      Pertinent Vitals/Pain Pain Assessment: Faces Faces Pain Scale: Hurts even more Pain Location: Lt shoulder Pain Descriptors / Indicators: Grimacing;Stabbing Pain Intervention(s): Limited activity within patient's tolerance;Monitored during session;Repositioned (Educated on rotator cuff and limiting ROM)    Home Living                      Prior Function            PT Goals (current goals can now be found in the care plan section) Acute Rehab PT Goals Patient Stated Goal: return to doing pushups Time For Goal Achievement: 09/06/15 Progress towards PT goals: Progressing toward goals (most goals met; will continue to advance exercise program)    Frequency  Min 3X/week    PT Plan Discharge plan needs to be updated;Frequency needs to be updated    Co-evaluation             End of Session   Activity Tolerance: Patient tolerated treatment well Patient left: in bed;Other (comment) (with MD)     Time: 0518-3358 PT Time Calculation (min) (ACUTE ONLY): 22 min  Charges:  $Therapeutic Exercise: 8-22 mins                    G Codes:      Mordechai Matuszak 2015/09/06, 11:17  AM Pager [phone]

## 2015-09-01 NOTE — Progress Notes (Signed)
[facility] PROGRESS NOTE  Steven Steele WJX:[id number] DOB: Mar 16, 1961 DOA: 08/28/2015 PCP: Farris Has, MD  Length of stay: 3   Assessment/Plan: Principal Problem:   Paresthesias Active Problems:   Hand weakness   Paresthesia   Brief summary 55 y.o. male without significant past medical history, comes in accompanied by his wife for evaluation of the aforementioned symptoms.Stated that never had similar symptoms, but 3 days ago developed acute onset of numbness in the left fingertips that subsequently traveled to the right hand-toes " and eventually moved to the whole body" [place] Spaniel that he feels weak all over, " with a strange sensation of pins and needless all over my body'. Mr. Harn said that the generalized weakness is so prominent that is hard for him to walk. In addition, he tells me that it is hard to swallow and feels that he is not able to completely empty his bladder and bowels. Admitted for possible GBS, patient noticed a rash one month ago,  Assessment and plan  1. Paresthesias and weak hand grip:  This is new.Markedly hypoactive deep tendon reflexes upper and lower extremities bilaterally, ? upgoing left toe, Neurology  following, Dr. Petra Kuba. Is Treating patient for guillian-barre syndrome MRI brain and cervicothoracic spine was negative, lumbar puncture negative, differential diagnosis potentially includes GBS, nutritional deficiencies.  Normal MRI rules out stroke and MS.  Status post LP Though he does not have an elevated protein in his CSF, this can be negative in the first week of symptoms Continue NIF, Negative Lyme serologies Infectious workup was negative HIV antibody pending Continue  IVIG    Today is day 3 Speech-mechanical soft diet, thin liquids PT-recommend home health  2. Erythema around the IV site superficial thrombophlebitis Confirmed on venous Doppler, started on warm compresses Switch IV sites  DVT prophylaxsis   Lovenox  Code Status:      Code Status Orders        Start     Ordered   08/29/15 0301  Full code   Continuous     08/29/15 0300      Family Communication: Discussed in detail with the patient, all imaging results, lab results explained to the patient   Disposition Plan: Anticipate discharge in 2 days     Consultants:  Neurology  Procedures:  Lumbar puncture  Antibiotics: Anti-infectives    None         HPI/Subjective: Able to ambulate, had a BM yesterday, feeling better   Objective: Filed Vitals:   08/31/15 2146 09/01/15 0202 09/01/15 0614 09/01/15 0937  BP: 142/86 140/83 158/96 164/94  Pulse: 92 76 70 100  Temp: 98.2 F (36.8 C) 97.9 F (36.6 C) 97.4 F (36.3 C) 98.3 F (36.8 C)  TempSrc: Oral Oral Oral Oral  Resp: 18 18 18 18   Height:      Weight:      SpO2: 98% 96% 95% 99%    Intake/Output Summary (Last 24 hours) at 09/01/15 1152 Last data filed at 09/01/15 0645  Gross per 24 hour  Intake    240 ml  Output    475 ml  Net   -235 ml    Exam:  General: No acute respiratory distress Lungs: Clear to auscultation bilaterally without wheezes or crackles Cardiovascular: Regular rate and rhythm without murmur gallop or rub normal S1 and S2 Abdomen: Nontender, nondistended, soft, bowel sounds positive, no rebound, no ascites, no appreciable mass 5/5 lower extremities proximally and distally. 4/5 right biceps and  weak grip bilaterally but offers poor effort on testing. Tone and bulk:normal tone throughout; no atrophy noted Sensory: Pinprick and light touch intact throughout, bilaterally Deep Tendon Reflexes:  Hypoactive biceps-triceps-knee jerk reflexes bilaterally  mild increase in erythema in the left arm at the IV site   Data Review   Micro Results Recent Results (from the past 240 hour(s))  CSF culture     Status: None   Collection Time: 08/29/15 12:48 PM  Result Value Ref Range Status   Specimen Description CSF  Final   Special  Requests NONE  Final   Gram Stain CYTOSPIN SMEAR NO WBC SEEN NO ORGANISMS SEEN   Final   Culture NO GROWTH 3 DAYS  Final   Report Status 09/01/2015 FINAL  Final    Radiology Reports Mr Laqueta JeanBrain W Wo Contrast  08/28/2015  CLINICAL DATA:  Developed LEFT hand hand numbness 3 days ago, ascending to entire body with generalized weakness and paresthesias. Difficulty walking and swallowing. EXAM: MRI HEAD WITHOUT AND WITH CONTRAST MRI CERVICAL SPINE WITHOUT AND WITH CONTRAST TECHNIQUE: Multiplanar, multiecho pulse sequences of the brain and surrounding structures, and cervical spine, to include the craniocervical junction and cervicothoracic junction, were obtained without and with intravenous contrast. CONTRAST:  20mL MULTIHANCE GADOBENATE DIMEGLUMINE 529 MG/ML IV SOLN 20 cc MultiHance COMPARISON:  None. FINDINGS: MRI HEAD FINDINGS The ventricles and sulci are normal for patient's age. No abnormal parenchymal signal, mass lesions, mass effect. No abnormal parenchymal enhancement. No reduced diffusion to suggest acute ischemia. No susceptibility artifact to suggest hemorrhage. No abnormal extra-axial fluid collections. No extra-axial masses nor leptomeningeal enhancement. Normal major intracranial vascular flow voids seen at the skull base. Ocular globes and orbital contents are nonsuspicious though not tailored for evaluation ; old LEFT medial orbital blowout fracture. No suspicious calvarial bone marrow signal. No abnormal sellar expansion. Craniocervical junction maintained. Mild fronto ethmoidal mucosal thickening without paranasal sinus air-fluid levels. Mastoid air cells are well aerated. MRI CERVICAL SPINE FINDINGS Cervical vertebral bodies intact and aligned with straightened cervical lordosis. Borderline congenital canal narrowing on the basis of foreshortened pedicles. Moderate to severe C5-6 disc height loss, moderate at C6-7 with moderate subacute to chronic discogenic endplate changes. Mild-to-moderate  C3-4 chronic discogenic endplate changes. No STIR signal abnormality to suggest acute osseous process. No abnormal osseous or intradiscal enhancement. Cervical spinal cord normal morphology and signal characteristics of are medullary junction is well though she 2 3 vomiting most caudal well visualized level. No abnormal cord, leptomeningeal or epidural enhancement. Prevertebral area pacer normal. Level by level evaluation: C2-3: No significant disc bulge. Mild facet arthropathy without canal stenosis or neural foraminal narrowing. C3-4: Annular bulging. Uncovertebral hypertrophy and mild facet arthropathy. No canal stenosis. Moderate to severe RIGHT, moderate LEFT neural foraminal narrowing. C4-5: Small broad-based disc bulge. Uncovertebral hypertrophy. Mild canal stenosis. Moderate RIGHT, moderate to severe LEFT neural foraminal narrowing. C5-6: Small broad-based disc bulge, small LEFT central to subarticular disc protrusion. Uncovertebral hypertrophy. Mild canal stenosis. Mild to moderate bilateral neural foraminal narrowing. C6-7: Moderate LEFT central disc protrusion, uncovertebral hypertrophy. Mild canal stenosis with slight LEFT ventral cord deformity. No neural foraminal narrowing. C7-T1: Small broad-based disc bulge, no canal stenosis. Mild bilateral neural foraminal narrowing. IMPRESSION: MRI BRAIN:  Normal MRI of the brain with and without contrast. MRI CERVICAL SPINE: Straightened cervical lordosis without acute fracture or malalignment. Normal contrast-enhanced MRI of the cervical spinal cord. Degenerative change of the cervical spine superimposed on borderline congenital canal narrowing. Mild canal stenosis C4-5 through  C6-7. Neural foraminal narrowing C3-4 through C5-6, C7-T1: Moderate to severe on the RIGHT at C3-4 and on the LEFT at C4-5. Electronically Signed   By: Awilda Metro M.D.   On: 08/28/2015 23:21   Mr Cervical Spine W Wo Contrast  08/28/2015  CLINICAL DATA:  Developed LEFT hand hand  numbness 3 days ago, ascending to entire body with generalized weakness and paresthesias. Difficulty walking and swallowing. EXAM: MRI HEAD WITHOUT AND WITH CONTRAST MRI CERVICAL SPINE WITHOUT AND WITH CONTRAST TECHNIQUE: Multiplanar, multiecho pulse sequences of the brain and surrounding structures, and cervical spine, to include the craniocervical junction and cervicothoracic junction, were obtained without and with intravenous contrast. CONTRAST:  20mL MULTIHANCE GADOBENATE DIMEGLUMINE 529 MG/ML IV SOLN 20 cc MultiHance COMPARISON:  None. FINDINGS: MRI HEAD FINDINGS The ventricles and sulci are normal for patient's age. No abnormal parenchymal signal, mass lesions, mass effect. No abnormal parenchymal enhancement. No reduced diffusion to suggest acute ischemia. No susceptibility artifact to suggest hemorrhage. No abnormal extra-axial fluid collections. No extra-axial masses nor leptomeningeal enhancement. Normal major intracranial vascular flow voids seen at the skull base. Ocular globes and orbital contents are nonsuspicious though not tailored for evaluation ; old LEFT medial orbital blowout fracture. No suspicious calvarial bone marrow signal. No abnormal sellar expansion. Craniocervical junction maintained. Mild fronto ethmoidal mucosal thickening without paranasal sinus air-fluid levels. Mastoid air cells are well aerated. MRI CERVICAL SPINE FINDINGS Cervical vertebral bodies intact and aligned with straightened cervical lordosis. Borderline congenital canal narrowing on the basis of foreshortened pedicles. Moderate to severe C5-6 disc height loss, moderate at C6-7 with moderate subacute to chronic discogenic endplate changes. Mild-to-moderate C3-4 chronic discogenic endplate changes. No STIR signal abnormality to suggest acute osseous process. No abnormal osseous or intradiscal enhancement. Cervical spinal cord normal morphology and signal characteristics of are medullary junction is well though she 2 3  vomiting most caudal well visualized level. No abnormal cord, leptomeningeal or epidural enhancement. Prevertebral area pacer normal. Level by level evaluation: C2-3: No significant disc bulge. Mild facet arthropathy without canal stenosis or neural foraminal narrowing. C3-4: Annular bulging. Uncovertebral hypertrophy and mild facet arthropathy. No canal stenosis. Moderate to severe RIGHT, moderate LEFT neural foraminal narrowing. C4-5: Small broad-based disc bulge. Uncovertebral hypertrophy. Mild canal stenosis. Moderate RIGHT, moderate to severe LEFT neural foraminal narrowing. C5-6: Small broad-based disc bulge, small LEFT central to subarticular disc protrusion. Uncovertebral hypertrophy. Mild canal stenosis. Mild to moderate bilateral neural foraminal narrowing. C6-7: Moderate LEFT central disc protrusion, uncovertebral hypertrophy. Mild canal stenosis with slight LEFT ventral cord deformity. No neural foraminal narrowing. C7-T1: Small broad-based disc bulge, no canal stenosis. Mild bilateral neural foraminal narrowing. IMPRESSION: MRI BRAIN:  Normal MRI of the brain with and without contrast. MRI CERVICAL SPINE: Straightened cervical lordosis without acute fracture or malalignment. Normal contrast-enhanced MRI of the cervical spinal cord. Degenerative change of the cervical spine superimposed on borderline congenital canal narrowing. Mild canal stenosis C4-5 through C6-7. Neural foraminal narrowing C3-4 through C5-6, C7-T1: Moderate to severe on the RIGHT at C3-4 and on the LEFT at C4-5. Electronically Signed   By: Awilda Metro M.D.   On: 08/28/2015 23:21   Mr Thoracic Spine W Wo Contrast  08/28/2015  CLINICAL DATA:  Developed LEFT hand hand numbness 3 days ago, ascending to entire body with generalized weakness and paresthesias. Difficulty walking and swallowing. EXAM: MRI THORACIC WITHOUT AND WITH CONTRAST TECHNIQUE: Multiplanar and multiecho pulse sequences of the thoracic spine were obtained without  and with intravenous contrast.  CONTRAST:  20 cc MultiHance COMPARISON:  None. FINDINGS: MR THORACIC SPINE FINDINGS Thoracic vertebral bodies are intact and aligned maintenance of thoracic kyphosis. Intervertebral disc morphology generally preserved with decreased T2 without disc compatible with mild desiccation. Multilevel mild chronic discogenic endplate changes without STIR signal abnormality to suggest acute osseous process. No abnormal osseous or intradiscal enhancement. Thoracic spinal cord is normal morphology and signal characteristics to the level of the conus medullaris which is partially imaged at T12-L1. No abnormal cord, leptomeningeal or epidural enhancement. Included prevertebral and paraspinal soft tissues are nonsuspicious. 2.2 cm nonenhancing cyst upper pole of RIGHT kidney. Mild lower thoracic facet arthropathy without significant disc bulge, canal stenosis or neural foraminal narrowing at any level. IMPRESSION: Early degenerative change of thoracic spine without acute fracture nor malalignment. No neurocompressive changes. Normal MRI of the thoracic spinal cord with and without contrast. Electronically Signed   By: Awilda Metro M.D.   On: 08/28/2015 23:32   Dg Fluoro Guide Lumbar Puncture  08/29/2015  CLINICAL DATA:  Numbness of hands and feet. Hypersensitivity to pain. EXAM: DIAGNOSTIC LUMBAR PUNCTURE UNDER FLUOROSCOPIC GUIDANCE FLUOROSCOPY TIME:  Fluoroscopy Time (in minutes and seconds): 48 seconds Number of Acquired Images:  2 PROCEDURE: Informed consent was obtained from the patient prior to the procedure, including potential complications of headache, allergy, and pain. With the patient prone, the lower back was prepped with Betadine. 1% Lidocaine was used for local anesthesia. Lumbar puncture was performed at the L3-L4 level using a 22 gauge needle with return of clear CSF with an opening pressure of 14 cm water. Fifteen ml of CSF were obtained for laboratory studies. The patient  tolerated the procedure well and there were no apparent complications. IMPRESSION: Successful lumbar puncture, with normal opening pressure. CSF fluid was sent to microbiology as requested. Electronically Signed   By: Ted Mcalpine M.D.   On: 08/29/2015 13:28     CBC  Recent Labs Lab 08/28/15 1819 08/30/15 1024 08/31/15 0515  WBC 10.7* 9.7 7.1  HGB 16.6 16.5 15.5  HCT 48.6 49.0 45.3  PLT 131* 175 185  MCV 86.3 86.1 86.3  MCH 29.5 29.0 29.5  MCHC 34.2 33.7 34.2  RDW 12.8 12.9 13.0  LYMPHSABS 1.4 3.0  --   MONOABS 0.3 0.8  --   EOSABS 0.0 0.3  --   BASOSABS 0.0 0.0  --     Chemistries   Recent Labs Lab 08/28/15 1819 08/30/15 1024 08/31/15 0515  NA 143 141 140  K 4.1 3.6 3.5  CL 106 104 103  CO2 26 25 26   GLUCOSE 111* 101* 109*  BUN 16 21* 15  CREATININE 0.85 0.97 0.90  CALCIUM 10.3 10.3 9.1  AST 24 24 25   ALT 28 27 28   ALKPHOS 95 91 73  BILITOT 0.7 1.3* 1.0   ------------------------------------------------------------------------------------------------------------------ estimated creatinine clearance is 107.7 mL/min (by C-G formula based on Cr of 0.9). ------------------------------------------------------------------------------------------------------------------ No results for input(s): HGBA1C in the last 72 hours. ------------------------------------------------------------------------------------------------------------------ No results for input(s): CHOL, HDL, LDLCALC, TRIG, CHOLHDL, LDLDIRECT in the last 72 hours. ------------------------------------------------------------------------------------------------------------------ No results for input(s): TSH, T4TOTAL, T3FREE, THYROIDAB in the last 72 hours.  Invalid input(s): FREET3 ------------------------------------------------------------------------------------------------------------------ No results for input(s): VITAMINB12, FOLATE, FERRITIN, TIBC, IRON, RETICCTPCT in the last 72  hours.  Coagulation profile No results for input(s): INR, PROTIME in the last 168 hours.  No results for input(s): DDIMER in the last 72 hours.  Cardiac Enzymes No results for input(s): CKMB, TROPONINI, MYOGLOBIN in the last 168  hours.  Invalid input(s): CK ------------------------------------------------------------------------------------------------------------------ Invalid input(s): POCBNP   CBG: No results for input(s): GLUCAP in the last 168 hours.     Studies: No results found.    No results found for: HGBA1C Lab Results  Component Value Date   CREATININE 0.90 08/31/2015       Scheduled Meds: . docusate sodium  200 mg Oral BID  . enoxaparin (LOVENOX) injection  40 mg Subcutaneous Q24H  . Immune Globulin 10%  400 mg/kg Intravenous Q24 Hr x 5  . LORazepam  1 mg Oral Once  . polyethylene glycol  17 g Oral Daily   Continuous Infusions:   Principal Problem:   Paresthesias Active Problems:   Hand weakness   Paresthesia    Time spent: 45 minutes   [facility]  [facility] Pager [phone]. If 7PM-7AM, please contact night-coverage at [email or web address], password [facility] 09/01/2015, 11:52 AM  LOS: 3 days

## 2015-09-01 NOTE — Progress Notes (Signed)
Occupational Therapy Treatment Patient Details Name: Steven Steele MRN: [id number] DOB: 02-Apr-1961 Today's Date: 09/01/2015    History of present illness Pt admitted with parasthesias and being treated for [facility]. No PMHx   OT comments  Pt making significant progress since yesterday. Increased BUE - weaker proximally and in the LUE. Continues with apparent parasthesias. Began education on HEP. Recommend pt follow up OT at the neuro outpt center. Will continue to follow acutely to address established goals.   Follow Up Recommendations  Outpatient OT (neurooutpt)    Equipment Recommendations  None recommended by OT    Recommendations for Other Services      Precautions / Restrictions Precautions Precautions: Other (comment) (hypersensitivity in arms/hand/trunk)       Mobility Bed Mobility Overal bed mobility: Modified Independent                Transfers Overall transfer level: Independent                    Balance             Standing balance-Leahy Scale: Good                     ADL                            Pt is walking to bathroom and managing ADL. Pt states he has difficulty due to hypersensitivity of fingers.  Encouraged pt to use B hands as tolerated instead of trying to use his "knuckles".             General ADL Comments: needs assistance. Pt able to manage clothing during ADL. pt states the sensitivity in his fingers makes it most difficult                                      Cognition   Behavior During Therapy: [facility] for tasks assessed/performed Overall Cognitive Status: Within Functional Limits for tasks assessed                       Extremity/Trunk Assessment   Pt reports improved BUE strength. Difficulty with using R hand due to IV placement. Complains of L shoulder pain. Reports history of having a bone spur and that symptoms have increased. Also c/o pain in upper traps.  Heat applied to traps.             Exercises Other Exercises Other Exercises: theraputty ex - mod resistance - handout given Other Exercises: theraband ex - handout given - level 2 Other Exercises: fine motor/coordination HEP Other Exercises: scapular elevation/depression x 10 reps sitting   Shoulder Instructions       General Comments      Pertinent Vitals/ Pain       Pain Assessment: Faces Faces Pain Scale: Hurts even more Pain Location: L shoulder/upper trap Pain Descriptors / Indicators: Cramping;Discomfort;Grimacing;Guarding Pain Intervention(s): Limited activity within patient's tolerance;Repositioned;Heat applied  Home Living                                          Prior Functioning/Environment              Frequency Min 3X/week  Progress Toward Goals  OT Goals(current goals can now be found in the care plan section)  Progress towards OT goals: Progressing toward goals  Acute Rehab OT Goals Patient Stated Goal: return to doing pushups OT Goal Formulation: With patient Time For Goal Achievement: 09/07/15 Potential to Achieve Goals: Good ADL Goals Pt Will Transfer to Toilet: Independently;ambulating Pt Will Perform Toileting - Clothing Manipulation and hygiene: Independently;sit to/from stand Pt Will Perform Tub/Shower Transfer: Shower transfer;Independently;ambulating Pt/caregiver will Perform Home Exercise Program: Increased strength;Both right and left upper extremity;With theraband;Independently Additional ADL Goal #1: Pt will demonstrate ability to open containers on meal tray, manipulate fasteners, and perform handwriting. Additional ADL Goal #2: Pt will be independent in showering. Additional ADL Goal #3: Pt will be independent in dressing.  Plan Discharge plan needs to be updated    Co-evaluation                 End of Session     Activity Tolerance Patient tolerated treatment well   Patient Left in bed;with  call bell/phone within reach;with family/visitor present   Nurse Communication Mobility status;Other (comment) (pt request for something for spasms)        Time: [id number] OT Time Calculation (min): 23 min  Charges: OT General Charges $OT Visit: 1 Procedure OT Treatments $Therapeutic Activity: 23-37 mins  Worth Kober,HILLARY 09/01/2015, 4:42 PM   [facility] Kristi Norment, OTR/L  [phone] 09/01/2015

## 2015-09-01 NOTE — Progress Notes (Signed)
Subjective: Reports subjective improvement.   Exam: Filed Vitals:   09/01/15 0614 09/01/15 0937  BP: 158/96 164/94  Pulse: 70 100  Temp: 97.4 F (36.3 C) 98.3 F (36.8 C)  Resp: 18 18   Gen: In bed, NAD Resp: non-labored breathing, no acute distress Abd: soft, nt  Neuro: MS: Awake, alert, oriented.  RS:WNIOECN:PERRL, EOMI, palate elevates well bilaterally Motor: 4-/5 proximally in the arms and legs.  Sensory:decreased in teh distal arms to cold.  DTR: Absent in the patellae, biceps, and ankle bilaterally.   Pertinent Labs: nif -60 still  Impression: 55 yo M with acute neuropathic symptoms most consistent with guillian-barre syndrome. Though he does not have an elevated protein in his CSF, this can be negative in the first week of symptoms. With consistent history and physical exam and no evidence for other causes by history or LP, I have started treating as guillian-barre.   Serum lyme titers are negative and I think that this is very unlikely to be the cause at this point.   Recommendations: 1) IVIG total of 2g/kg divided by 5 days, for total 400mg  per day. Today is day 3 2) PT,ST 4) NIFs 5) telemetry 6) will continue to follow.   Ritta SlotMcNeill Zia Najera, MD [facility] [phone]  If 7pm- 7am, please page neurology on call as listed in AMION.

## 2015-09-01 NOTE — Progress Notes (Signed)
Patient concerned about "discoloration and bruising" on left inner foot. I did not see much of anything but he is sure that it is different. Pulse was 2+ and temp was warm to the touch. No pain or discomfort. Patient also concerned that infection in left big toe has gotten worse. Toenail is white and thick. No redness or drainage of any sort. Will continue to monitor. Steven Steele, [name], RN

## 2015-09-02 ENCOUNTER — Inpatient Hospital Stay (HOSPITAL_COMMUNITY): Payer: Federal, State, Local not specified - PPO

## 2015-09-02 DIAGNOSIS — G610 Guillain-Barre syndrome: Principal | ICD-10-CM

## 2015-09-02 MED ORDER — KETOROLAC TROMETHAMINE 30 MG/ML IJ SOLN
30.0000 mg | Freq: Three times a day (TID) | INTRAMUSCULAR | Status: DC | PRN
  Administered 2015-09-02: 30 mg via INTRAVENOUS
  Filled 2015-09-02: qty 1

## 2015-09-02 NOTE — Progress Notes (Addendum)
[facility] PROGRESS NOTE  Steven Steele ZOX:[id number] DOB: [birth year] DOA: 08/28/2015 PCP: Farris Has, MD  Length of stay: 4   Assessment/Plan: Principal Problem:   Paresthesias Active Problems:   Hand weakness   Paresthesia    Brief summary 55 y.o. male without significant past medical history, comes in accompanied by his wife for evaluation of the aforementioned symptoms.Stated that never had similar symptoms, but 3 days ago developed acute onset of numbness in the left fingertips that subsequently traveled to the right hand-toes " and eventually moved to the whole body" [place] Spaniel that he feels weak all over, " with a strange sensation of pins and needless all over my body'. Mr. Robin said that the generalized weakness is so prominent that is hard for him to walk. In addition, he tells me that it is hard to swallow and feels that he is not able to completely empty his bladder and bowels. Admitted for possible GBS, patient noticed a rash one month ago,    Assessment and plan  1. Paresthesias and weak hand grip:  This is new.Markedly hypoactive deep tendon reflexes upper and lower extremities bilaterally, ? upgoing left toe, Neurology  following, Dr. Petra Kuba. Is Treating patient for guillian-barre syndrome MRI brain and cervicothoracic spine was negative, lumbar puncture negative, differential diagnosis potentially includes GBS, nutritional deficiencies.  Normal MRI rules out stroke and MS.  Status post LP Though he does not have an elevated protein in his CSF, this can be negative in the first week of symptoms Continue NIF, Negative Lyme serologies Infectious workup was negative HIV antibody pending Continue  IVIG    Today is day 4 Speech-mechanical soft diet, thin liquids PT-recommend home health  2. Erythema around the IV site superficial thrombophlebitis Confirmed on venous Doppler, started on warm compresses Switch IV sites  3.left shoulder  pain Obtain x-rays, normal white count, no redness, no evidence of septic joint toradol q 8 hr prn  Dr Dion Saucier consulted   DVT prophylaxsis  Lovenox  Code Status:      Code Status Orders        Start     Ordered   08/29/15 0301  Full code   Continuous     08/29/15 0300      Family Communication: Discussed in detail with the patient, all imaging results, lab results explained to the patient   Disposition Plan: Anticipate discharge in 2 days     Consultants:  Neurology  Procedures:  Lumbar puncture  Antibiotics: Anti-infectives    None         HPI/Subjective: Multiple complaints of shoulder pain, left toe discoloration,   Objective: Filed Vitals:   09/01/15 2113 09/02/15 0145 09/02/15 0553 09/02/15 1018  BP: 141/99 125/60 151/80 153/112  Pulse: 78 67 80 85  Temp: 98.2 F (36.8 C) 97.6 F (36.4 C) 97.6 F (36.4 C) 98.4 F (36.9 C)  TempSrc: Oral Oral Oral Oral  Resp: 18 18 18 18   Height:      Weight:      SpO2: 97% 95% 97% 98%   No intake or output data in the 24 hours ending 09/02/15 1109  Exam:  General: No acute respiratory distress Lungs: Clear to auscultation bilaterally without wheezes or crackles Cardiovascular: Regular rate and rhythm without murmur gallop or rub normal S1 and S2 Abdomen: Nontender, nondistended, soft, bowel sounds positive, no rebound, no ascites, no appreciable mass 5/5 lower extremities proximally and distally. 4/5 right biceps and weak grip  bilaterally but offers poor effort on testing. Tone and bulk:normal tone throughout; no atrophy noted Sensory: Pinprick and light touch intact throughout, bilaterally Deep Tendon Reflexes:  Hypoactive biceps-triceps-knee jerk reflexes bilaterally  mild increase in erythema in the left arm at the IV site improved   Data Review   Micro Results Recent Results (from the past 240 hour(s))  CSF culture     Status: None   Collection Time: 08/29/15 12:48 PM  Result Value Ref  Range Status   Specimen Description CSF  Final   Special Requests NONE  Final   Gram Stain CYTOSPIN SMEAR NO WBC SEEN NO ORGANISMS SEEN   Final   Culture NO GROWTH 3 DAYS  Final   Report Status 09/01/2015 FINAL  Final    Radiology Reports [name] Wo Contrast  08/28/2015  CLINICAL DATA:  Developed LEFT hand hand numbness 3 days ago, ascending to entire body with generalized weakness and paresthesias. Difficulty walking and swallowing. EXAM: MRI HEAD WITHOUT AND WITH CONTRAST MRI CERVICAL SPINE WITHOUT AND WITH CONTRAST TECHNIQUE: Multiplanar, multiecho pulse sequences of the brain and surrounding structures, and cervical spine, to include the craniocervical junction and cervicothoracic junction, were obtained without and with intravenous contrast. CONTRAST:  20mL MULTIHANCE GADOBENATE DIMEGLUMINE 529 MG/ML IV SOLN 20 cc MultiHance COMPARISON:  None. FINDINGS: MRI HEAD FINDINGS The ventricles and sulci are normal for patient's age. No abnormal parenchymal signal, mass lesions, mass effect. No abnormal parenchymal enhancement. No reduced diffusion to suggest acute ischemia. No susceptibility artifact to suggest hemorrhage. No abnormal extra-axial fluid collections. No extra-axial masses nor leptomeningeal enhancement. Normal major intracranial vascular flow voids seen at the skull base. Ocular globes and orbital contents are nonsuspicious though not tailored for evaluation ; old LEFT medial orbital blowout fracture. No suspicious calvarial bone marrow signal. No abnormal sellar expansion. Craniocervical junction maintained. Mild fronto ethmoidal mucosal thickening without paranasal sinus air-fluid levels. Mastoid air cells are well aerated. MRI CERVICAL SPINE FINDINGS Cervical vertebral bodies intact and aligned with straightened cervical lordosis. Borderline congenital canal narrowing on the basis of foreshortened pedicles. Moderate to severe C5-6 disc height loss, moderate at C6-7 with moderate  subacute to chronic discogenic endplate changes. Mild-to-moderate C3-4 chronic discogenic endplate changes. No STIR signal abnormality to suggest acute osseous process. No abnormal osseous or intradiscal enhancement. Cervical spinal cord normal morphology and signal characteristics of are medullary junction is well though she 2 3 vomiting most caudal well visualized level. No abnormal cord, leptomeningeal or epidural enhancement. Prevertebral area pacer normal. Level by level evaluation: C2-3: No significant disc bulge. Mild facet arthropathy without canal stenosis or neural foraminal narrowing. C3-4: Annular bulging. Uncovertebral hypertrophy and mild facet arthropathy. No canal stenosis. Moderate to severe RIGHT, moderate LEFT neural foraminal narrowing. C4-5: Small broad-based disc bulge. Uncovertebral hypertrophy. Mild canal stenosis. Moderate RIGHT, moderate to severe LEFT neural foraminal narrowing. C5-6: Small broad-based disc bulge, small LEFT central to subarticular disc protrusion. Uncovertebral hypertrophy. Mild canal stenosis. Mild to moderate bilateral neural foraminal narrowing. C6-7: Moderate LEFT central disc protrusion, uncovertebral hypertrophy. Mild canal stenosis with slight LEFT ventral cord deformity. No neural foraminal narrowing. C7-T1: Small broad-based disc bulge, no canal stenosis. Mild bilateral neural foraminal narrowing. IMPRESSION: MRI BRAIN:  Normal MRI of the brain with and without contrast. MRI CERVICAL SPINE: Straightened cervical lordosis without acute fracture or malalignment. Normal contrast-enhanced MRI of the cervical spinal cord. Degenerative change of the cervical spine superimposed on borderline congenital canal narrowing. Mild canal stenosis C4-5 through C6-7.  Neural foraminal narrowing C3-4 through C5-6, C7-T1: Moderate to severe on the RIGHT at C3-4 and on the LEFT at C4-5. Electronically Signed   By: Awilda Metro M.D.   On: 08/28/2015 23:21   Mr Cervical Spine W  Wo Contrast  08/28/2015  CLINICAL DATA:  Developed LEFT hand hand numbness 3 days ago, ascending to entire body with generalized weakness and paresthesias. Difficulty walking and swallowing. EXAM: MRI HEAD WITHOUT AND WITH CONTRAST MRI CERVICAL SPINE WITHOUT AND WITH CONTRAST TECHNIQUE: Multiplanar, multiecho pulse sequences of the brain and surrounding structures, and cervical spine, to include the craniocervical junction and cervicothoracic junction, were obtained without and with intravenous contrast. CONTRAST:  20mL MULTIHANCE GADOBENATE DIMEGLUMINE 529 MG/ML IV SOLN 20 cc MultiHance COMPARISON:  None. FINDINGS: MRI HEAD FINDINGS The ventricles and sulci are normal for patient's age. No abnormal parenchymal signal, mass lesions, mass effect. No abnormal parenchymal enhancement. No reduced diffusion to suggest acute ischemia. No susceptibility artifact to suggest hemorrhage. No abnormal extra-axial fluid collections. No extra-axial masses nor leptomeningeal enhancement. Normal major intracranial vascular flow voids seen at the skull base. Ocular globes and orbital contents are nonsuspicious though not tailored for evaluation ; old LEFT medial orbital blowout fracture. No suspicious calvarial bone marrow signal. No abnormal sellar expansion. Craniocervical junction maintained. Mild fronto ethmoidal mucosal thickening without paranasal sinus air-fluid levels. Mastoid air cells are well aerated. MRI CERVICAL SPINE FINDINGS Cervical vertebral bodies intact and aligned with straightened cervical lordosis. Borderline congenital canal narrowing on the basis of foreshortened pedicles. Moderate to severe C5-6 disc height loss, moderate at C6-7 with moderate subacute to chronic discogenic endplate changes. Mild-to-moderate C3-4 chronic discogenic endplate changes. No STIR signal abnormality to suggest acute osseous process. No abnormal osseous or intradiscal enhancement. Cervical spinal cord normal morphology and signal  characteristics of are medullary junction is well though she 2 3 vomiting most caudal well visualized level. No abnormal cord, leptomeningeal or epidural enhancement. Prevertebral area pacer normal. Level by level evaluation: C2-3: No significant disc bulge. Mild facet arthropathy without canal stenosis or neural foraminal narrowing. C3-4: Annular bulging. Uncovertebral hypertrophy and mild facet arthropathy. No canal stenosis. Moderate to severe RIGHT, moderate LEFT neural foraminal narrowing. C4-5: Small broad-based disc bulge. Uncovertebral hypertrophy. Mild canal stenosis. Moderate RIGHT, moderate to severe LEFT neural foraminal narrowing. C5-6: Small broad-based disc bulge, small LEFT central to subarticular disc protrusion. Uncovertebral hypertrophy. Mild canal stenosis. Mild to moderate bilateral neural foraminal narrowing. C6-7: Moderate LEFT central disc protrusion, uncovertebral hypertrophy. Mild canal stenosis with slight LEFT ventral cord deformity. No neural foraminal narrowing. C7-T1: Small broad-based disc bulge, no canal stenosis. Mild bilateral neural foraminal narrowing. IMPRESSION: MRI BRAIN:  Normal MRI of the brain with and without contrast. MRI CERVICAL SPINE: Straightened cervical lordosis without acute fracture or malalignment. Normal contrast-enhanced MRI of the cervical spinal cord. Degenerative change of the cervical spine superimposed on borderline congenital canal narrowing. Mild canal stenosis C4-5 through C6-7. Neural foraminal narrowing C3-4 through C5-6, C7-T1: Moderate to severe on the RIGHT at C3-4 and on the LEFT at C4-5. Electronically Signed   By: Awilda Metro M.D.   On: 08/28/2015 23:21   Mr Thoracic Spine W Wo Contrast  08/28/2015  CLINICAL DATA:  Developed LEFT hand hand numbness 3 days ago, ascending to entire body with generalized weakness and paresthesias. Difficulty walking and swallowing. EXAM: MRI THORACIC WITHOUT AND WITH CONTRAST TECHNIQUE: Multiplanar and  multiecho pulse sequences of the thoracic spine were obtained without and with intravenous contrast. CONTRAST:  20 cc MultiHance COMPARISON:  None. FINDINGS: MR THORACIC SPINE FINDINGS Thoracic vertebral bodies are intact and aligned maintenance of thoracic kyphosis. Intervertebral disc morphology generally preserved with decreased T2 without disc compatible with mild desiccation. Multilevel mild chronic discogenic endplate changes without STIR signal abnormality to suggest acute osseous process. No abnormal osseous or intradiscal enhancement. Thoracic spinal cord is normal morphology and signal characteristics to the level of the conus medullaris which is partially imaged at T12-L1. No abnormal cord, leptomeningeal or epidural enhancement. Included prevertebral and paraspinal soft tissues are nonsuspicious. 2.2 cm nonenhancing cyst upper pole of RIGHT kidney. Mild lower thoracic facet arthropathy without significant disc bulge, canal stenosis or neural foraminal narrowing at any level. IMPRESSION: Early degenerative change of thoracic spine without acute fracture nor malalignment. No neurocompressive changes. Normal MRI of the thoracic spinal cord with and without contrast. Electronically Signed   By: Awilda Metro M.D.   On: 08/28/2015 23:32   Dg Fluoro Guide Lumbar Puncture  08/29/2015  CLINICAL DATA:  Numbness of hands and feet. Hypersensitivity to pain. EXAM: DIAGNOSTIC LUMBAR PUNCTURE UNDER FLUOROSCOPIC GUIDANCE FLUOROSCOPY TIME:  Fluoroscopy Time (in minutes and seconds): 48 seconds Number of Acquired Images:  2 PROCEDURE: Informed consent was obtained from the patient prior to the procedure, including potential complications of headache, allergy, and pain. With the patient prone, the lower back was prepped with Betadine. 1% Lidocaine was used for local anesthesia. Lumbar puncture was performed at the L3-L4 level using a 22 gauge needle with return of clear CSF with an opening pressure of 14 cm water.  Fifteen ml of CSF were obtained for laboratory studies. The patient tolerated the procedure well and there were no apparent complications. IMPRESSION: Successful lumbar puncture, with normal opening pressure. CSF fluid was sent to microbiology as requested. Electronically Signed   By: Ted Mcalpine M.D.   On: 08/29/2015 13:28     CBC  Recent Labs Lab 08/28/15 1819 08/30/15 1024 08/31/15 0515  WBC 10.7* 9.7 7.1  HGB 16.6 16.5 15.5  HCT 48.6 49.0 45.3  PLT 131* 175 185  MCV 86.3 86.1 86.3  MCH 29.5 29.0 29.5  MCHC 34.2 33.7 34.2  RDW 12.8 12.9 13.0  LYMPHSABS 1.4 3.0  --   MONOABS 0.3 0.8  --   EOSABS 0.0 0.3  --   BASOSABS 0.0 0.0  --     Chemistries   Recent Labs Lab 08/28/15 1819 08/30/15 1024 08/31/15 0515  NA 143 141 140  K 4.1 3.6 3.5  CL 106 104 103  CO2 26 25 26   GLUCOSE 111* 101* 109*  BUN 16 21* 15  CREATININE 0.85 0.97 0.90  CALCIUM 10.3 10.3 9.1  AST 24 24 25   ALT 28 27 28   ALKPHOS 95 91 73  BILITOT 0.7 1.3* 1.0   ------------------------------------------------------------------------------------------------------------------ estimated creatinine clearance is 107.7 mL/min (by C-G formula based on Cr of 0.9). ------------------------------------------------------------------------------------------------------------------ No results for input(s): HGBA1C in the last 72 hours. ------------------------------------------------------------------------------------------------------------------ No results for input(s): CHOL, HDL, LDLCALC, TRIG, CHOLHDL, LDLDIRECT in the last 72 hours. ------------------------------------------------------------------------------------------------------------------ No results for input(s): TSH, T4TOTAL, T3FREE, THYROIDAB in the last 72 hours.  Invalid input(s): FREET3 ------------------------------------------------------------------------------------------------------------------ No results for input(s): VITAMINB12,  FOLATE, FERRITIN, TIBC, IRON, RETICCTPCT in the last 72 hours.  Coagulation profile No results for input(s): INR, PROTIME in the last 168 hours.  No results for input(s): DDIMER in the last 72 hours.  Cardiac Enzymes No results for input(s): CKMB, TROPONINI, MYOGLOBIN in the last 168 hours.  Invalid input(s): CK ------------------------------------------------------------------------------------------------------------------ Invalid input(s): POCBNP   CBG: No results for input(s): GLUCAP in the last 168 hours.     Studies: No results found.    No results found for: HGBA1C Lab Results  Component Value Date   CREATININE 0.90 08/31/2015       Scheduled Meds: . docusate sodium  200 mg Oral BID  . enoxaparin (LOVENOX) injection  40 mg Subcutaneous Q24H  . Immune Globulin 10%  400 mg/kg Intravenous Q24 Hr x 5  . LORazepam  1 mg Oral Once  . polyethylene glycol  17 g Oral Daily   Continuous Infusions:   Principal Problem:   Paresthesias Active Problems:   Hand weakness   Paresthesia    Time spent: 45 minutes   Centennial Peaks HospitalBROL,Jareb Radoncic  [facility] Pager [phone]. If 7PM-7AM, please contact night-coverage at [email or web address], password [email or web address] 09/02/2015, 11:09 AM  LOS: 4 days

## 2015-09-02 NOTE — Care Management Note (Signed)
Case Management Note  Patient Details  Name: Karlton LemonRobert Constancio MRN: [id number] Date of Birth: Dec 19, 1960  Subjective/Objective:                    Action/Plan: Patient on day four of his five day IVIG therapy. CM will continue to follow for discharge needs.    Expected Discharge Date:                  Expected Discharge Plan:     In-House Referral:     Discharge planning Services     Post Acute Care Choice:    Choice offered to:     DME Arranged:    DME Agency:     HH Arranged:    HH Agency:     Status of Service:  In process, will continue to follow  Medicare Important Message Given:    Date Medicare IM Given:    Medicare IM give by:    Date Additional Medicare IM Given:    Additional Medicare Important Message give by:     If discussed at Long Length of Stay Meetings, dates discussed:    Additional Comments:  Kermit BaloKelli F Glynn Freas, RN 09/02/2015, 3:05 PM

## 2015-09-02 NOTE — Progress Notes (Signed)
VC 4.2   NIF -20  Patient gave good effort.

## 2015-09-02 NOTE — Consult Note (Signed)
  ORTHOPAEDIC CONSULTATION  REQUESTING PHYSICIAN: Richarda OverlieNayana Abrol, MD  Chief Complaint: Left shoulder pain  HPI: Steven Steele is a [age] y.o. male who complains of  chronic left shoulder pain that acutely worse. 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 Worse with overhead activity and with movement. He says it feels like it catches from time to time. He is concerned because it does keep him up at night. Pain is better with rest. He describes it as a sharp pain.  History reviewed. No pertinent past medical history. History reviewed. No pertinent past surgical history. Social History   Social History  . Marital Status: Significant Other    Spouse Name: N/A  . Number of Children: N/A  . Years of Education: N/A   Social History Main Topics  . Smoking status: Current Some Day Smoker    Types: Cigars  . Smokeless tobacco: None  . Alcohol Use: No  . Drug Use: No  . Sexual Activity: Not Asked   Other Topics Concern  . None   Social History Narrative   Family History  Problem Relation Age of Onset  . Cancer Mother     Breast  . Cancer Father     Lung  . Diabetes Paternal Uncle    No Known Allergies   Positive ROS: All other systems have been reviewed and were otherwise negative with the exception of those mentioned in the HPI and as above.  Physical Exam: General: Alert, no acute distress Cardiovascular: No pedal edema Respiratory: No cyanosis, no use of accessory musculature GI: No organomegaly, abdomen is soft and non-tender Skin: No lesions in the area of chief complaint Neurologic: Decreased sensation in bilateral upper extremities below the elbow. Psychiatric: Patient is competent for consent with normal mood and affect Lymphatic: No axillary or cervical  lymphadenopathy  MUSCULOSKELETAL: Left shoulder active motion is 0-165 of external rotation to 40. Infraspinatus and supraspinous strength seems to be present, although he has a little bit of weakness, but I believe he is still functional. Before acromioclavicular joint is nontender.  Assessment: Principal Problem:   Paresthesias Active Problems:   Hand weakness   Paresthesia   GBS (Guillain Barre syndrome) (HCC)   Left shoulder impingement syndrome, question neurologic etiology of pain.  Plan: His presentation certainly seems to be a bursitis type of presentation, however it is possible that given the acute change in his symptoms since his Guillain-Barr that it may not be actually mechanical from his shoulder. Additionally his pain is somewhat more posterior than is typical with impingement syndrome, but nonetheless we'll try a diagnostic and hopefully therapeutic subacromial injection, and he can follow up with me in the office in 2 weeks.  Preprocedure diagnosis: Left shoulder subacromial impingement  Postprocedure diagnosis: Same  Procedure: Left shoulder subacromial injection  Procedure details: After informed verbal consent was obtained the left posterior shoulder was prepped with chlorhexidine and four milliliters of half percent Marcaine combined with 40 mg of Depo-Medrol were injected in the subacromial space. He tolerated this well and a Band-Aid was placed.    Eulas PostLANDAU,Kenlyn Lose P, MD Cell [phone]   09/02/2015 6:26 PM

## 2015-09-02 NOTE — Progress Notes (Signed)
Subjective: Reports subjective improvement.   Exam: Filed Vitals:   09/02/15 0553 09/02/15 1018  BP: 151/80 153/112  Pulse: 80 85  Temp: 97.6 F (36.4 C) 98.4 F (36.9 C)  Resp: 18 18   Gen: In bed, NAD Resp: non-labored breathing, no acute distress Abd: soft, nt  Neuro: MS: Awake, alert, oriented.  ZO:XWRUECN:PERRL, EOMI, palate elevates well bilaterally Motor: 5.5 proximally in the arms and legs.  DTR: Absent in the patellae, biceps, and ankle bilaterally.  Able to stand from a seated position without using his arms  Impression: 55 yo M with acute neuropathic symptoms most consistent with guillian-barre syndrome. Though he does not have an elevated protein in his CSF, this can be negative in the first week of symptoms. With consistent history and physical exam and no evidence for other causes by history or LP, I have started treating as guillian-barre.   Recommendations: 1) IVIG total of 2g/kg divided by 5 days, for total 400mg  per day. Today is day 4 2) PT,ST 4) given the improvement, can discontinue NIFs 5) telemetry 6) will continue to follow.  7) also given his improvement, he will be able to be discharged following his administration of IVIG tomorrow.  Ritta SlotMcNeill Noble Cicalese, MD [facility] [phone]  If 7pm- 7am, please page neurology on call as listed in AMION.

## 2015-09-03 LAB — CBC
HEMATOCRIT: 40.5 % (ref 39.0–52.0)
Hemoglobin: 13.9 g/dL (ref 13.0–17.0)
MCH: 29.3 pg (ref 26.0–34.0)
MCHC: 34.3 g/dL (ref 30.0–36.0)
MCV: 85.3 fL (ref 78.0–100.0)
PLATELETS: 206 10*3/uL (ref 150–400)
RBC: 4.75 MIL/uL (ref 4.22–5.81)
RDW: 12.9 % (ref 11.5–15.5)
WBC: 7.2 10*3/uL (ref 4.0–10.5)

## 2015-09-03 LAB — COMPREHENSIVE METABOLIC PANEL
ALT: 45 U/L (ref 17–63)
ANION GAP: 6 (ref 5–15)
AST: 43 U/L — ABNORMAL HIGH (ref 15–41)
Albumin: 3.2 g/dL — ABNORMAL LOW (ref 3.5–5.0)
Alkaline Phosphatase: 62 U/L (ref 38–126)
BILIRUBIN TOTAL: 0.8 mg/dL (ref 0.3–1.2)
BUN: 18 mg/dL (ref 6–20)
CHLORIDE: 108 mmol/L (ref 101–111)
CO2: 25 mmol/L (ref 22–32)
Calcium: 9.3 mg/dL (ref 8.9–10.3)
Creatinine, Ser: 0.73 mg/dL (ref 0.61–1.24)
Glucose, Bld: 107 mg/dL — ABNORMAL HIGH (ref 65–99)
POTASSIUM: 4.3 mmol/L (ref 3.5–5.1)
Sodium: 139 mmol/L (ref 135–145)
TOTAL PROTEIN: 8.6 g/dL — AB (ref 6.5–8.1)

## 2015-09-03 MED ORDER — DOCUSATE SODIUM 100 MG PO CAPS: 200.0000 mg | ORAL_CAPSULE | Freq: Two times a day (BID) | ORAL | Status: DC

## 2015-09-03 NOTE — Progress Notes (Signed)
RN discussed discharge instructions with patient including follow up appointments, medications. Patient stated he did not need colace, would not be picking prescription up from pharmacy. Discharge instructions given to patient. Patient vocalized understanding of hospital stay, plan of care, discharge instructions. Waiting for IVIG to be completed, per MD order, patient to be discharged after completion. Will continue to monitor closely.

## 2015-09-03 NOTE — Progress Notes (Signed)
Occupational Therapy Treatment and Discharge Note Patient Details Name: Steven Steele MRN: [id number] DOB: [birth year] Today's Date: 09/03/2015    History of present illness Pt admitted with parasthesias and being treated for [facility]. No PMHx   OT comments  Pt is performing home exercise program and ADL/ADL transfers independently.  Modified L shoulder to AROM and avoiding resistance to minimize exacerbation of pain. Pt reports improved sensation in B UEs. Handwriting has not returned to baseline, but is legible. No further acute OT needs. Pt is eager to return back home and to work.   Follow Up Recommendations  Outpatient OT (neuro)    Equipment Recommendations  None recommended by OT    Recommendations for Other Services      Precautions / Restrictions Precautions Precaution Comments: L shoulder pain, gentle exercise       Mobility Bed Mobility Overal bed mobility: Independent                Transfers Overall transfer level: Independent                    Balance     Sitting balance-Leahy Scale: Good       Standing balance-Leahy Scale: Good                     ADL Overall ADL's : Modified independent                                     Functional mobility during ADLs: Independent General ADL Comments: Pt continues to have sensation of pins and needles from forearms to fingers, but greatly improved. 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 Worked on [employer].      Vision                     Perception     Praxis      Cognition   Behavior During Therapy: WFL for tasks assessed/performed Overall Cognitive Status: Within Functional Limits for tasks assessed                       Extremity/Trunk Assessment               Exercises Other Exercises Other Exercises: moderate resistance theraputty exercises B Other Exercises: level 2 theraband exercise- R  shoulder, AROM L shoulder Other Exercises: scapular elevation/depression, add/abduction x 15 B   Shoulder Instructions       General Comments      Pertinent Vitals/ Pain       Pain Assessment: Faces Faces Pain Scale: Hurts a little bit Pain Location: L shoulder Pain Descriptors / Indicators: Sore;Grimacing Pain Intervention(s): Limited activity within patient's tolerance  Home Living                                          Prior Functioning/Environment              Frequency       Progress Toward Goals  OT Goals(current goals can now be found in the care plan section)  Progress towards OT goals: Goals met/education completed, patient discharged from OT  Acute Rehab OT Goals Patient Stated Goal: return to doing pushups  Plan Discharge plan remains appropriate  Co-evaluation                 End of Session     Activity Tolerance Patient tolerated treatment well   Patient Left in bed;with call bell/phone within reach   Nurse Communication          Time: 0920-0938 OT Time Calculation (min): 18 min  Charges: OT General Charges $OT Visit: 1 Procedure OT Treatments $Therapeutic Exercise: 8-22 mins  Malka So 09/03/2015, 9:47 AM  [phone]

## 2015-09-03 NOTE — Care Management Note (Signed)
Case Management Note  Patient Details  Name: Steven Steele MRN: [id number] Date of Birth: 1960-09-22  Subjective/Objective:                    Action/Plan: Patient discharging home with self care. PT/OT recommending outpatient therapy. CM spoke with Dr Allyson Sabal and she was in agreement for outpatient therapy. CM met with Steven Steele and he is interested in trying it. CM provided him with the map and number for [facility]. And placed an order in [facility] and placed it on the AVS. Will update bedside RN.   Expected Discharge Date:                  Expected Discharge Plan:  Home/Self Care  In-House Referral:     Discharge planning Services  CM Consult  Post Acute Care Choice:    Choice offered to:     DME Arranged:    DME Agency:     HH Arranged:    [place] Agency:     Status of Service:  Completed, signed off  Medicare Important Message Given:    Date Medicare IM Given:    Medicare IM give by:    Date Additional Medicare IM Given:    Additional Medicare Important Message give by:     If discussed at [place] of Stay Meetings, dates discussed:    Additional Comments:  Pollie Friar, RN 09/03/2015, 12:13 PM

## 2015-09-03 NOTE — Discharge Instructions (Signed)
Paresthesia Paresthesia is an abnormal burning or prickling sensation. This sensation is generally felt in the hands, arms, legs, or feet. However, it may occur in any part of the body. Usually, it is not painful. The feeling may be described as:  Tingling or numbness.  Pins and needles.  Skin crawling.  Buzzing.  Limbs falling asleep.  Itching. Most people experience temporary (transient) paresthesia at some time in their lives. Paresthesia may occur when you breathe too quickly (hyperventilation). It can also occur without any apparent cause. Commonly, paresthesia occurs when pressure is placed on a nerve. The sensation quickly goes away after the pressure is removed. For some people, however, paresthesia is a long-lasting (chronic) condition that is caused by an underlying disorder. If you continue to have paresthesia, you may need further medical evaluation. HOME CARE INSTRUCTIONS Watch your condition for any changes. Taking the following actions may help to lessen any discomfort that you are feeling:  Avoid drinking alcohol.  Try acupuncture or massage to help relieve your symptoms.  Keep all follow-up visits as directed by your health care provider. This is important. SEEK MEDICAL CARE IF:  You continue to have episodes of paresthesia.  Your burning or prickling feeling gets worse when you walk.  You have pain, cramps, or dizziness.  You develop a rash. SEEK IMMEDIATE MEDICAL CARE IF:  You feel weak.  You have trouble walking or moving.  You have problems with speech, understanding, or vision.  You feel confused.  You cannot control your bladder or bowel movements.  You have numbness after an injury.  You faint.   This information is not intended to replace advice given to you by your health care provider. Make sure you discuss any questions you have with your health care provider.   Document Released: 07/28/2002 Document Revised: 12/22/2014 Document Reviewed:  08/03/2014 Elsevier Interactive Patient Education 2016 Elsevier Inc.  Guillain-Barre Syndrome Guillain-Barre syndrome (GBS) is a rare disorder in which your body's defense (immune) system attacks your nervous system. GBS is a kind of autoimmune disorder. GBS is not contagious and most people with GBS recover within a few months. However, some people may still have some weakness after a few years.  CAUSES  The exact cause of GBS syndrome is not known. The disorder usually develops a few days or weeks after a viral infection, such as a stomach (gastrointestinal) or breathing (respiratory) infection. Sometimes surgery or vaccinations will trigger the syndrome.  SIGNS AND SYMPTOMS  The most common signs and symptoms of GBS are tingling, numbness, and weakness in your lower legs. You may have more symptoms in other areas of your body after a few weeks. Signs and symptoms may eventually include:  Muscle weakness that spreads from your legs to your arms and trunk.  Difficulty breathing.  Total loss of muscle use.  Facial weakness.  Double vision.  Trouble swallowing.  Slurred speech.  Aching or burning pain.  Rapid breathing and heart rate.  Flushing or cold and clammy skin.  Dizziness when standing.  Trouble passing urine or having bowel movements. DIAGNOSIS  Your health care provider will do a physical exam to diagnose GBS. You may also have tests to confirm GBS. These may include:  A nerve conduction velocity (NCV) test to check for slowing of nerve signals.  A spinal tap to check for protein in the fluid around the spinal cord. TREATMENT  Treatment focuses on relieving symptoms and speeding up recovery. The most important part of treatment is to  keep your body functioning while your nervous system recovers. Severe cases of GBS may require hospitalization. Possible treatments include:   Plasmapheresis.This is a type of blood transfusion. It removes immune system cells that are  attacking your nervous system.  Intravenous injections of special proteins (immunoglobulins or antibodies). These proteins may slow down the immune system's attack on peripheral nerves.  Medicines to control the symptoms of GBS. HOME CARE INSTRUCTIONS  Physical therapy may be recommended by your health care provider. Follow your exercises as directed by your physical therapist.  Make sure you have the support you need.  Keep all follow-up visits as directed by your health care provider. This is important.  Take medicines only as directed by your health care provider. SEEK MEDICAL CARE IF:  You have new symptoms or your symptoms get worse.  You do not feel safe or supported at home. SEEK IMMEDIATE MEDICAL CARE IF:  You have trouble breathing.  You have trouble swallowing.  You choke after eating or drinking.  You cannot move.  You pass out.  MAKE SURE YOU:  Understand these instructions.  Watch your condition.  Get help right away if you are not doing well or get worse.   This information is not intended to replace advice given to you by your health care provider. Make sure you discuss any questions you have with your health care provider.   Document Released: 07/28/2002 Document Revised: 08/28/2014 Document Reviewed: 10/08/2013 Elsevier Interactive Patient Education [employer]2016 Elsevier Inc.

## 2015-09-03 NOTE — Progress Notes (Signed)
RT note: NIF/VC order dc'd.

## 2015-09-03 NOTE — Progress Notes (Signed)
Patient completed IVIG therapy. IV discontinued. Patient waiting for ride to arrive. Patient to be escorted by wheelchair to [facility] for [facility] home.

## 2015-09-03 NOTE — Progress Notes (Signed)
Physical Therapy Treatment and discharge Patient Details Name: Steven Steele MRN: [id number] DOB: 27-Jul-1961 Today's Date: 09/03/2015    History of Present Illness Pt admitted with parasthesias and being treated for [facility]. No PMHx    PT Comments    Patient moving about room independently while receiving IVIG. Reviewed balance exercises, precautions with LUE s/p injection, and need for followup OPPT to assist with return to work/strengthening/Lt shoulder rehab. Pt in agreement and plans to d/c home today after infusion complete.   Follow Up Recommendations  Outpatient PT     Equipment Recommendations  None recommended by PT    Recommendations for Other Services       Precautions / Restrictions Precautions Precautions: Fall Restrictions Weight Bearing Restrictions: No    Mobility  Bed Mobility                  Transfers Overall transfer level: Independent                  Ambulation/Gait Ambulation/Gait assistance: Independent   Assistive device: None           Stairs            Wheelchair Mobility    Modified Rankin (Stroke Patients Only)       Balance     Sitting balance-Leahy Scale: Good       Standing balance-Leahy Scale: Good                      Cognition Arousal/Alertness: Awake/alert Behavior During Therapy: WFL for tasks assessed/performed Overall Cognitive Status: Within Functional Limits for tasks assessed                      Exercises General Exercises - Lower Extremity Toe Raises: AROM;Both;10 reps;Standing Heel Raises: AROM;Both;10 reps;Standing Other Exercises Other Exercises: reviewed core tightening exercises and encouraged pt to begin doing in sitting Other Exercises: educated pt on limiting activity with LUE s/p injection "not overdo and make things worse" Other Exercises: pt reports he has been doing standing exercises with minimal imbalance    General Comments General  comments (skin integrity, edema, etc.): Pt receiving IVIG during session, therefore limited to activity at bedside      Pertinent Vitals/Pain Pain Assessment: Faces Faces Pain Scale: Hurts little more Pain Location: Hands  Pain Descriptors / Indicators: Pins and needles Pain Intervention(s): Limited activity within patient's tolerance    Home Living                      Prior Function            PT Goals (current goals can now be found in the care plan section) Acute Rehab PT Goals Patient Stated Goal: return to doing pushups Time For Goal Achievement: 09/06/15 Progress towards PT goals: Goals met/education completed, patient discharged from PT    Frequency  Min 3X/week    PT Plan Current plan remains appropriate    Co-evaluation             End of Session   Activity Tolerance: Patient tolerated treatment well Patient left: in chair;Other (comment);with call bell/phone within reach (with MD)   Patient is being discharged from PT services secondary to:  Goals met and no further therapy needs identified.    Please see latest Therapy Progress Note for current level of functioning and progress toward goals.  Progress and discharge plan and discussed with patient/caregiver and they  Agree    Time: 1325-1340 PT Time Calculation (min) (ACUTE ONLY): 15 min  Charges:  $Therapeutic Exercise: 8-22 mins                    G Codes:      Marilyn Wing Sep 27, 2015, 3:21 PM Pager [phone]

## 2015-09-03 NOTE — Discharge Summary (Signed)
Physician Discharge Summary  Steven Steele MRN: [id number] DOB/AGE: 1961-04-20 55 y.o.  PCP: London Pepper, MD   Admit date: 08/28/2015 Discharge date: 09/03/2015  Discharge Diagnoses:     Principal Problem:   Paresthesias Active Problems:   Hand weakness   Paresthesia   GBS (Guillain Barre syndrome) (HCC) Left shoulder subacromial impingement   Follow-up recommendations Follow-up with PCP in 3-5 days , including all  additional recommended appointments as below Follow-up CBC, CMP in 3-5 days      Medication List    TAKE these medications        ALKA-SELTZER PLS NIGHT CLD/FLU 5-6.25-10-325 MG Caps  Generic drug:  Phenyleph-Doxylamine-DM-APAP  Take 2 capsules by mouth at bedtime as needed (for cold).     docusate sodium 100 MG capsule  Commonly known as:  COLACE  Take 2 capsules (200 mg total) by mouth 2 (two) times daily.     fluticasone 50 MCG/ACT nasal spray  Commonly known as:  FLONASE  Place 1 spray into both nostrils daily as needed for allergies or rhinitis.         Discharge Condition:    Discharge Instructions       Discharge Instructions    Diet - low sodium heart healthy    Complete by:  As directed      Increase activity slowly    Complete by:  As directed            No Known Allergies    Disposition: Final discharge disposition not confirmed   Consults:  Orthopedics neurology   Significant Diagnostic Studies:  Mr Kizzie Fantasia Contrast  08/28/2015  CLINICAL DATA:  Developed LEFT hand hand numbness 3 days ago, ascending to entire body with generalized weakness and paresthesias. Difficulty walking and swallowing. EXAM: MRI HEAD WITHOUT AND WITH CONTRAST MRI CERVICAL SPINE WITHOUT AND WITH CONTRAST TECHNIQUE: Multiplanar, multiecho pulse sequences of the brain and surrounding structures, and cervical spine, to include the craniocervical junction and cervicothoracic junction, were obtained without and with intravenous contrast.  CONTRAST:  66m MULTIHANCE GADOBENATE DIMEGLUMINE 529 MG/ML IV SOLN 20 cc MultiHance COMPARISON:  None. FINDINGS: MRI HEAD FINDINGS The ventricles and sulci are normal for patient's age. No abnormal parenchymal signal, mass lesions, mass effect. No abnormal parenchymal enhancement. No reduced diffusion to suggest acute ischemia. No susceptibility artifact to suggest hemorrhage. No abnormal extra-axial fluid collections. No extra-axial masses nor leptomeningeal enhancement. Normal major intracranial vascular flow voids seen at the skull base. Ocular globes and orbital contents are nonsuspicious though not tailored for evaluation ; old LEFT medial orbital blowout fracture. No suspicious calvarial bone marrow signal. No abnormal sellar expansion. Craniocervical junction maintained. Mild fronto ethmoidal mucosal thickening without paranasal sinus air-fluid levels. Mastoid air cells are well aerated. MRI CERVICAL SPINE FINDINGS Cervical vertebral bodies intact and aligned with straightened cervical lordosis. Borderline congenital canal narrowing on the basis of foreshortened pedicles. Moderate to severe C5-6 disc height loss, moderate at C6-7 with moderate subacute to chronic discogenic endplate changes. Mild-to-moderate C3-4 chronic discogenic endplate changes. No STIR signal abnormality to suggest acute osseous process. No abnormal osseous or intradiscal enhancement. Cervical spinal cord normal morphology and signal characteristics of are medullary junction is well though she 2 3 vomiting most caudal well visualized level. No abnormal cord, leptomeningeal or epidural enhancement. Prevertebral area pacer normal. Level by level evaluation: C2-3: No significant disc bulge. Mild facet arthropathy without canal stenosis or neural foraminal narrowing. C3-4: Annular bulging. Uncovertebral hypertrophy and mild facet arthropathy.  No canal stenosis. Moderate to severe RIGHT, moderate LEFT neural foraminal narrowing. C4-5: Small  broad-based disc bulge. Uncovertebral hypertrophy. Mild canal stenosis. Moderate RIGHT, moderate to severe LEFT neural foraminal narrowing. C5-6: Small broad-based disc bulge, small LEFT central to subarticular disc protrusion. Uncovertebral hypertrophy. Mild canal stenosis. Mild to moderate bilateral neural foraminal narrowing. C6-7: Moderate LEFT central disc protrusion, uncovertebral hypertrophy. Mild canal stenosis with slight LEFT ventral cord deformity. No neural foraminal narrowing. C7-T1: Small broad-based disc bulge, no canal stenosis. Mild bilateral neural foraminal narrowing. IMPRESSION: MRI BRAIN:  Normal MRI of the brain with and without contrast. MRI CERVICAL SPINE: Straightened cervical lordosis without acute fracture or malalignment. Normal contrast-enhanced MRI of the cervical spinal cord. Degenerative change of the cervical spine superimposed on borderline congenital canal narrowing. Mild canal stenosis C4-5 through C6-7. Neural foraminal narrowing C3-4 through C5-6, C7-T1: Moderate to severe on the RIGHT at C3-4 and on the LEFT at C4-5. Electronically Signed   By: Elon Alas M.D.   On: 08/28/2015 23:21   Mr Cervical Spine W Wo Contrast  08/28/2015  CLINICAL DATA:  Developed LEFT hand hand numbness 3 days ago, ascending to entire body with generalized weakness and paresthesias. Difficulty walking and swallowing. EXAM: MRI HEAD WITHOUT AND WITH CONTRAST MRI CERVICAL SPINE WITHOUT AND WITH CONTRAST TECHNIQUE: Multiplanar, multiecho pulse sequences of the brain and surrounding structures, and cervical spine, to include the craniocervical junction and cervicothoracic junction, were obtained without and with intravenous contrast. CONTRAST:  70m MULTIHANCE GADOBENATE DIMEGLUMINE 529 MG/ML IV SOLN 20 cc MultiHance COMPARISON:  None. FINDINGS: MRI HEAD FINDINGS The ventricles and sulci are normal for patient's age. No abnormal parenchymal signal, mass lesions, mass effect. No abnormal parenchymal  enhancement. No reduced diffusion to suggest acute ischemia. No susceptibility artifact to suggest hemorrhage. No abnormal extra-axial fluid collections. No extra-axial masses nor leptomeningeal enhancement. Normal major intracranial vascular flow voids seen at the skull base. Ocular globes and orbital contents are nonsuspicious though not tailored for evaluation ; old LEFT medial orbital blowout fracture. No suspicious calvarial bone marrow signal. No abnormal sellar expansion. Craniocervical junction maintained. Mild fronto ethmoidal mucosal thickening without paranasal sinus air-fluid levels. Mastoid air cells are well aerated. MRI CERVICAL SPINE FINDINGS Cervical vertebral bodies intact and aligned with straightened cervical lordosis. Borderline congenital canal narrowing on the basis of foreshortened pedicles. Moderate to severe C5-6 disc height loss, moderate at C6-7 with moderate subacute to chronic discogenic endplate changes. Mild-to-moderate C3-4 chronic discogenic endplate changes. No STIR signal abnormality to suggest acute osseous process. No abnormal osseous or intradiscal enhancement. Cervical spinal cord normal morphology and signal characteristics of are medullary junction is well though she 2 3 vomiting most caudal well visualized level. No abnormal cord, leptomeningeal or epidural enhancement. Prevertebral area pacer normal. Level by level evaluation: C2-3: No significant disc bulge. Mild facet arthropathy without canal stenosis or neural foraminal narrowing. C3-4: Annular bulging. Uncovertebral hypertrophy and mild facet arthropathy. No canal stenosis. Moderate to severe RIGHT, moderate LEFT neural foraminal narrowing. C4-5: Small broad-based disc bulge. Uncovertebral hypertrophy. Mild canal stenosis. Moderate RIGHT, moderate to severe LEFT neural foraminal narrowing. C5-6: Small broad-based disc bulge, small LEFT central to subarticular disc protrusion. Uncovertebral hypertrophy. Mild canal  stenosis. Mild to moderate bilateral neural foraminal narrowing. C6-7: Moderate LEFT central disc protrusion, uncovertebral hypertrophy. Mild canal stenosis with slight LEFT ventral cord deformity. No neural foraminal narrowing. C7-T1: Small broad-based disc bulge, no canal stenosis. Mild bilateral neural foraminal narrowing. IMPRESSION: MRI BRAIN:  Normal MRI  of the brain with and without contrast. MRI CERVICAL SPINE: Straightened cervical lordosis without acute fracture or malalignment. Normal contrast-enhanced MRI of the cervical spinal cord. Degenerative change of the cervical spine superimposed on borderline congenital canal narrowing. Mild canal stenosis C4-5 through C6-7. Neural foraminal narrowing C3-4 through C5-6, C7-T1: Moderate to severe on the RIGHT at C3-4 and on the LEFT at C4-5. Electronically Signed   By: Elon Alas M.D.   On: 08/28/2015 23:21   Mr Thoracic Spine W Wo Contrast  08/28/2015  CLINICAL DATA:  Developed LEFT hand hand numbness 3 days ago, ascending to entire body with generalized weakness and paresthesias. Difficulty walking and swallowing. EXAM: MRI THORACIC WITHOUT AND WITH CONTRAST TECHNIQUE: Multiplanar and multiecho pulse sequences of the thoracic spine were obtained without and with intravenous contrast. CONTRAST:  20 cc MultiHance COMPARISON:  None. FINDINGS: MR THORACIC SPINE FINDINGS Thoracic vertebral bodies are intact and aligned maintenance of thoracic kyphosis. Intervertebral disc morphology generally preserved with decreased T2 without disc compatible with mild desiccation. Multilevel mild chronic discogenic endplate changes without STIR signal abnormality to suggest acute osseous process. No abnormal osseous or intradiscal enhancement. Thoracic spinal cord is normal morphology and signal characteristics to the level of the conus medullaris which is partially imaged at T12-L1. No abnormal cord, leptomeningeal or epidural enhancement. Included prevertebral and  paraspinal soft tissues are nonsuspicious. 2.2 cm nonenhancing cyst upper pole of RIGHT kidney. Mild lower thoracic facet arthropathy without significant disc bulge, canal stenosis or neural foraminal narrowing at any level. IMPRESSION: Early degenerative change of thoracic spine without acute fracture nor malalignment. No neurocompressive changes. Normal MRI of the thoracic spinal cord with and without contrast. Electronically Signed   By: Elon Alas M.D.   On: 08/28/2015 23:32   Dg Shoulder Left  09/02/2015  CLINICAL DATA:  Left shoulder pain for 2 days.  No known injury. EXAM: LEFT SHOULDER - 2+ VIEW COMPARISON:  None. FINDINGS: There is no evidence of fracture or dislocation. There is no evidence of arthropathy or other focal bone abnormality. Soft tissues are unremarkable. IMPRESSION: No acute osseous injury of the left shoulder. Electronically Signed   By: Kathreen Devoid   On: 09/02/2015 17:45   Dg Fluoro Guide Lumbar Puncture  08/29/2015  CLINICAL DATA:  Numbness of hands and feet. Hypersensitivity to pain. EXAM: DIAGNOSTIC LUMBAR PUNCTURE UNDER FLUOROSCOPIC GUIDANCE FLUOROSCOPY TIME:  Fluoroscopy Time (in minutes and seconds): 48 seconds Number of Acquired Images:  2 PROCEDURE: Informed consent was obtained from the patient prior to the procedure, including potential complications of headache, allergy, and pain. With the patient prone, the lower back was prepped with Betadine. 1% Lidocaine was used for local anesthesia. Lumbar puncture was performed at the L3-L4 level using a 22 gauge needle with return of clear CSF with an opening pressure of 14 cm water. Fifteen ml of CSF were obtained for laboratory studies. The patient tolerated the procedure well and there were no apparent complications. IMPRESSION: Successful lumbar puncture, with normal opening pressure. CSF fluid was sent to microbiology as requested. Electronically Signed   By: Fidela Salisbury M.D.   On: 08/29/2015 13:28         Filed Weights   08/28/15 1730  Weight: 95.709 kg (211 lb)     Microbiology: Recent Results (from the past 240 hour(s))  CSF culture     Status: None   Collection Time: 08/29/15 12:48 PM  Result Value Ref Range Status   Specimen Description CSF  Final  Special Requests NONE  Final   Gram Stain CYTOSPIN SMEAR NO WBC SEEN NO ORGANISMS SEEN   Final   Culture NO GROWTH 3 DAYS  Final   Report Status 09/01/2015 FINAL  Final       Blood Culture    Component Value Date/Time   SDES CSF 08/29/2015 1248   SPECREQUEST NONE 08/29/2015 1248   CULT NO GROWTH 3 DAYS 08/29/2015 1248   REPTSTATUS 09/01/2015 FINAL 08/29/2015 1248      Labs: Results for orders placed or performed during the hospital encounter of 08/28/15 (from the past 48 hour(s))  CBC     Status: None   Collection Time: 09/03/15  5:52 AM  Result Value Ref Range   WBC 7.2 4.0 - 10.5 K/uL   RBC 4.75 4.22 - 5.81 MIL/uL   Hemoglobin 13.9 13.0 - 17.0 g/dL   HCT 40.5 39.0 - 52.0 %   MCV 85.3 78.0 - 100.0 fL   MCH 29.3 26.0 - 34.0 pg   MCHC 34.3 30.0 - 36.0 g/dL   RDW 12.9 11.5 - 15.5 %   Platelets 206 150 - 400 K/uL  Comprehensive metabolic panel     Status: Abnormal   Collection Time: 09/03/15  5:52 AM  Result Value Ref Range   Sodium 139 135 - 145 mmol/L   Potassium 4.3 3.5 - 5.1 mmol/L   Chloride 108 101 - 111 mmol/L   CO2 25 22 - 32 mmol/L   Glucose, Bld 107 (H) 65 - 99 mg/dL   BUN 18 6 - 20 mg/dL   Creatinine, Ser 0.73 0.61 - 1.24 mg/dL   Calcium 9.3 8.9 - 10.3 mg/dL   Total Protein 8.6 (H) 6.5 - 8.1 g/dL   Albumin 3.2 (L) 3.5 - 5.0 g/dL   AST 43 (H) 15 - 41 U/L   ALT 45 17 - 63 U/L   Alkaline Phosphatase 62 38 - 126 U/L   Total Bilirubin 0.8 0.3 - 1.2 mg/dL   GFR calc non Af Amer >60 >60 mL/min   GFR calc Af Amer >60 >60 mL/min    Comment: (NOTE) The eGFR has been calculated using the CKD EPI equation. This calculation has not been validated in all clinical situations. eGFR's persistently <60  mL/min signify possible Chronic Kidney Disease.    Anion gap 6 5 - 15     Lipid Panel  No results found for: CHOL, TRIG, HDL, CHOLHDL, VLDL, LDLCALC, LDLDIRECT   No results found for: HGBA1C   Lab Results  Component Value Date   CREATININE 0.73 09/03/2015     HPI :*55 y.o. male without significant past medical history, comes in accompanied by his wife for evaluation of the aforementioned symptoms.Stated that never had similar symptoms, but 3 days ago developed acute onset of numbness in the left fingertips that subsequently traveled to the right hand-toes " and eventually moved to the whole body" Steven Steele that he feels weak all over, " with a strange sensation of pins and needless all over my body'. Mr. Quattrone said that the generalized weakness is so prominent that is hard for him to walk. In addition, he tells me that it is hard to swallow and feels that he is not able to completely empty his bladder and bowels. Admitted for possible GBS,Though he does not have an elevated protein in his CSF, this can be negative in the first week of symptoms. With consistent history and physical exam and no evidence for other causes by history or LP,  I have started treating as guillian-barre.   HOSPITAL COURSE:   1. Paresthesias and weak hand grip:   Markedly hypoactive deep tendon reflexes upper and lower extremities bilaterally, ? upgoing left toe, Neurology following, Dr. Katherine Roan. Is Treating patient for guillian-barre syndrome MRI brain and cervicothoracic spine was negative, lumbar puncture negative, differential diagnosis potentially includes GBS, nutritional deficiencies.  Normal MRI rules out stroke and MS.  Status post LP Though he does not have an elevated protein in his CSF, this can be negative in the first week of symptoms Continue NIF, within normal limits Negative Lyme serologies Infectious workup was negative HIV antibody negative Status post 5 days of IVIG    Speech-mechanical soft diet, thin liquids PT-recommend home health   2. Erythema around the IV site superficial thrombophlebitis Confirmed on venous Doppler, started on warm compresses Switched  IV sites  3.left shoulder pain X-ray within normal limits, no redness, no evidence of septic joint Received toradol q 8 hr prn  Dr Mardelle Matte consulted , status post  steroid  injection for subacromial bursitis   Discharge Exam:  Blood pressure 157/94, pulse 93, temperature 98 F (36.7 C), temperature source Oral, resp. rate 18, height _0  (1.727 m), weight 95.709 kg (211 lb), SpO2 98 %.  General: No acute respiratory distress Lungs: Clear to auscultation bilaterally without wheezes or crackles Cardiovascular: Regular rate and rhythm without murmur gallop or rub normal S1 and S2 Abdomen: Nontender, nondistended, soft, bowel sounds positive, no rebound, no ascites, no appreciable mass 5/5 lower extremities proximally and distally. 4/5 right biceps and weak grip bilaterally but offers poor effort on testing. Tone and bulk:normal tone throughout; no atrophy noted Sensory: Pinprick and light touch intact throughout, bilaterally Deep Tendon Reflexes:  Hypoactive biceps-triceps-knee jerk reflexes bilaterally    Follow-up Information    Follow up with LANDAU,JOSHUA P, MD In 2 weeks.   Specialty:  Orthopedic Surgery   Contact information:   [address] [place] [address] [phone]       Follow up with London Pepper, MD. Schedule an appointment as soon as possible for a visit in 1 week.   Specialty:  Family Medicine   Contact information:   [place] [address] [phone]       Signed: Reyne Dumas 09/03/2015, 11:30 AM        Time spent >45 mins

## 2015-09-13 ENCOUNTER — Ambulatory Visit: Payer: Federal, State, Local not specified - PPO | Attending: Internal Medicine | Admitting: Rehabilitation

## 2015-09-13 ENCOUNTER — Encounter: Payer: Self-pay | Admitting: Rehabilitation

## 2015-09-13 ENCOUNTER — Ambulatory Visit: Payer: Federal, State, Local not specified - PPO | Admitting: Occupational Therapy

## 2015-09-13 ENCOUNTER — Encounter: Payer: Self-pay | Admitting: Occupational Therapy

## 2015-09-13 VITALS — BP 158/103 | HR 74

## 2015-09-13 DIAGNOSIS — M25512 Pain in left shoulder: Secondary | ICD-10-CM | POA: Insufficient documentation

## 2015-09-13 DIAGNOSIS — G610 Guillain-Barre syndrome: Secondary | ICD-10-CM | POA: Diagnosis present

## 2015-09-13 NOTE — Therapy (Signed)
[facility] [address] [place], [place], [address] Phone: [phone]   Fax:  [phone]  Occupational Therapy Evaluation  Patient Details  Name: Steven Steele MRN: [id number] Date of Birth: 06-18-1961 Referring Provider: Dr. Richarda Overlie  Encounter Date: 09/13/2015      OT End of Session - 09/13/15 0914    Visit Number 1   Number of Visits 1   Authorization Type BCBS   OT Start Time 0845   OT Stop Time 0905   OT Time Calculation (min) 20 min   Activity Tolerance Patient tolerated treatment well      History reviewed. No pertinent past medical history.  History reviewed. No pertinent past surgical history.  Filed Vitals:   09/13/15 0847  BP: 158/103  Pulse: 74    Visit Diagnosis:  Pain in joint of left shoulder      Subjective Assessment - 09/13/15 0848    Subjective  I am having problems with my blood pressure   Pertinent History see epic Pt with GBS; HTN.    Patient Stated Goals I am hoping to hear you say I don't need any more therapy   Currently in Pain? Yes   Pain Score 1    Pain Location Shoulder   Pain Orientation Left   Pain Descriptors / Indicators Aching   Pain Type Acute pain   Pain Onset 1 to 4 weeks ago   Pain Frequency Intermittent   Aggravating Factors  worse as the day goes on, shoulder abduction   Pain Relieving Factors advil           [facility] OT Assessment - 09/13/15 0850    Assessment   Diagnosis GBS   Referring Provider Dr. Richarda Overlie   Onset Date 08/28/15   Prior Therapy PT, OT and ST in acute care   Precautions   Precautions None   Restrictions   Weight Bearing Restrictions No   Balance Screen   Has the patient fallen in the past 6 months Yes   How many times? 1  tripped over the dog - my legs have been weak PT eval today   Home  Environment   Family/patient expects to be discharged to: Private residence   Living Arrangements Spouse/significant other   Available Help at Discharge Available 24 hours/day   Type of Home House   Home Layout One level   Bathroom Shower/Tub Producer, television/film/video Standard   Additional Comments No grab bars, no equipment in the bathroom.    Prior Function   Level of Independence Independent   Vocation Full time employment   Engineer, agricultural   ADL   Eating/Feeding Independent   Grooming Independent   Comptroller - Conservator, museum/gallery -  Tour manager Independent   IADL   Shopping Takes care of all shopping needs independently   Light Housekeeping Maintains house alone or with occasional assistance   Meal Prep Plans, prepares and serves adequate meals independently   Community Mobility Drives own vehicle   Medication Management Is responsible for taking medication in correct dosages at correct time   Development worker, community financial matters independently (budgets, writes checks, pays rent, bills goes to bank), collects and keeps track of income   Mobility  Mobility Status Independent   Written Expression   Dominant Hand Right   Vision - History   Baseline Vision Wears glasses all the time   Vision Assessment   Comment Pt states vision was blurry initially but this has resolved.   Activity Tolerance   Activity Tolerance Tolerate 30+ min activity without fatigue   Activity Tolerance Comments Pt states 1-10 he would rate it an 8; improving daily   Cognition   Overall Cognitive Status Within Functional Limits for tasks assessed   Sensation   Light Touch Appears Intact   Hot/Cold Appears Intact   Proprioception Appears Intact   Coordination   Gross Motor Movements are Fluid and Coordinated Yes   Fine Motor Movements are Fluid and Coordinated  Yes   Finger Nose Finger Test [facility]   Tone   Assessment Location Right Upper Extremity;Left Upper Extremity   Strength   Overall Strength Within functional limits for tasks performed   Overall Strength Comments Pt with difficulty tolerating MMT to shoulder flexion on LUE due to pain in L shoulder - pt injected in hospital for probable bursitis and has follow up appt with orthopedic MD in 2 days.   Hand Function   Right Hand Gross Grasp Functional   Right Hand Grip (lbs) 95   Left Hand Gross Grasp Functional   Left Hand Grip (lbs) 95   RUE Tone   RUE Tone Within Functional Limits   LUE Tone   LUE Tone Within Functional Limits           Vestibular Assessment - 09/13/15 0001    Symptom Behavior   Type of Dizziness Unsteady with head/body turns   Frequency of Dizziness head movements   Duration of Dizziness only in the moment of the head turn   Aggravating Factors Looking up to the ceiling;Turning head quickly   Relieving Factors Rest;Slow movements   Occulomotor Exam   Occulomotor Alignment Normal   Spontaneous Absent   Gaze-induced --  slight L beating when tracking L, single beat   Vestibulo-Occular Reflex   VOR 1 Head Only (x 1 viewing) normal in both vertical and horizontal   VOR 2 Head and Object (x 2 viewing) WFL, no dizziness                        OT Short Term Goals - 09/13/15 0908    OT SHORT TERM GOAL #1   Title n/a           OT Long Term Goals - 09/13/15 0908    OT LONG TERM GOAL #1   Title n/a               Plan - 09/13/15 0908    Clinical Impression Statement Pt is a 55 year old male with h/o of HTN and recent diagnosis/hospitalization for GBS from 08/28/2015- 09/03/2015.  Pt reports he feels he is improving every day.  Pt arrived for OT evaluation today:  BP 158/103, pulse 74 at begining of session.  At end of session, 152/105.  Strongly recommended pt follow up asap with PCP. Pt scheduled for blood work this afternoon with PCP and  states he will share.  Only deficit upon OT evaluation is pain in L shoulder (1/10) - pt was given injection while hospitalized with suspciion of bursitis. Pt reports pain is improving daily and has follow up appt with orthopedic MD in two days. At this time, do not feel pt has skilled OT  needs therefore do not recommend OT follow up.    Pt will benefit from skilled therapeutic intervention in order to improve on the following deficits (Retired) Pain   Rehab Potential --  n/a   OT Frequency --  n/a   OT Treatment/Interventions --  n/a   Plan no further OT needs at this time   Consulted and Agree with Plan of Care Patient        Problem List Patient Active Problem List   Diagnosis Date Noted  . GBS (Guillain Barre syndrome) (HCC)   . Paresthesias 08/29/2015  . Hand weakness 08/29/2015  . Paresthesia 08/29/2015    Mackie Pai [facility] 09/13/2015, 9:15 AM  [facility] [address] [place], [place], [address] Phone: [phone]   Fax:  [phone]  Name: Steven Steele MRN: [id number] Date of Birth: 1961-07-07

## 2015-09-13 NOTE — Therapy (Signed)
[facility] [address] [place], [place], [address] Phone: [phone]   Fax:  [phone]  Physical Therapy Evaluation  Patient Details  Name: Steven Steele MRN: [id number] Date of Birth: 12-28-1960 No Data Recorded  Encounter Date: 09/13/2015      PT End of Session - 09/13/15 0837    Visit Number 1   Number of Visits 1  eval only   PT Start Time 0800   PT Stop Time 0835   PT Time Calculation (min) 35 min   Activity Tolerance Patient tolerated treatment well   Behavior During Therapy [facility] for tasks assessed/performed      History reviewed. No pertinent past medical history.  History reviewed. No pertinent past surgical history.  There were no vitals filed for this visit.  Visit Diagnosis:  GBS (Guillain-Barre syndrome) (HCC)      Subjective Assessment - 09/13/15 0805    Subjective "I was asked by someone in the hospital to go to physical therapist."   Limitations Lifting;Writing   Patient Stated Goals "I want to get back to normal."    Currently in Pain? Yes   Pain Score 1    Pain Location Shoulder   Pain Orientation Left   Pain Descriptors / Indicators Aching   Pain Type Acute pain   Pain Onset 1 to 4 weeks ago   Pain Frequency Intermittent   Aggravating Factors  worse as the day goes on, shoulder abduction   Pain Relieving Factors advil            [facility] PT Assessment - 09/13/15 0001    Assessment   Medical Diagnosis GBS   Onset Date/Surgical Date 08/28/15   Hand Dominance Right   Restrictions   Weight Bearing Restrictions No   Balance Screen   Has the patient fallen in the past 6 months Yes   How many times? 1   Has the patient had a decrease in activity level because of a fear of falling?  No   Is the patient reluctant to leave their home because of a fear of falling?  No   Home Environment   Living Environment Private residence   Living Arrangements Spouse/significant other   Available Help at Discharge Available 24 hours/day  GF lives with him for now (24/7)   Type of Home House   Home Access Stairs to enter   Entrance Stairs-Number of Steps 15   Entrance Stairs-Rails Right;Left;Can reach both   Home Layout Two level;Laundry or work area in basement   Alternate Teacher, music of Steps 15   Alternate Level Stairs-Rails Left   Home Equipment None   Prior Function   Level of Independence Independent   Vocation Full time employment   Engineer, agricultural   Cognition   Overall Cognitive Status Within Functional Limits for tasks assessed   Sensation   Light Touch Appears Intact   Hot/Cold Appears Intact  per report   Proprioception Appears Intact   Coordination   Gross Motor Movements are Fluid and Coordinated Yes  in LEs   Fine Motor Movements are Fluid and Coordinated Yes  in LEs   Heel Shin Test WFL   ROM / Strength   AROM / PROM / Strength Strength   Strength   Overall Strength Within functional limits for tasks performed   Overall Strength Comments Pt just states overall "I feel weak"    Transfers   Transfers Sit to Stand;Stand to Sit   Sit  to Stand 7: Independent   Stand to Sit 7: Independent   Ambulation/Gait   Ambulation/Gait Yes   Ambulation/Gait Assistance 7: Independent   Ambulation Distance (Feet) 345 Feet   Assistive device None   Gait Pattern Within Functional Limits   Ambulation Surface Level;Indoor   Gait velocity 4.63 ft/sec   Stairs Yes   Stairs Assistance 7: Independent   Stair Management Technique No rails;Alternating pattern;Forwards   Number of Stairs 4   Height of Stairs 6   Functional Gait  Assessment   Gait assessed  Yes   Gait Level Surface Walks 20 ft in less than 5.5 sec, no assistive devices, good speed, no evidence for imbalance, normal gait pattern, deviates no more than 6 in outside of the 12 in walkway width.   Change in Gait Speed Able to smoothly change walking speed without  loss of balance or gait deviation. Deviate no more than 6 in outside of the 12 in walkway width.   Gait with Horizontal Head Turns Performs head turns smoothly with no change in gait. Deviates no more than 6 in outside 12 in walkway width  reports dizziness   Gait with Vertical Head Turns Performs head turns with no change in gait. Deviates no more than 6 in outside 12 in walkway width.  reports dizziness   Gait and Pivot Turn Pivot turns safely within 3 sec and stops quickly with no loss of balance.   Step Over Obstacle Is able to step over 2 stacked shoe boxes taped together (9 in total height) without changing gait speed. No evidence of imbalance.   Gait with Narrow Base of Support Is able to ambulate for 10 steps heel to toe with no staggering.   Gait with Eyes Closed Walks 20 ft, no assistive devices, good speed, no evidence of imbalance, normal gait pattern, deviates no more than 6 in outside 12 in walkway width. Ambulates 20 ft in less than 7 sec.   Ambulating Backwards Walks 20 ft, no assistive devices, good speed, no evidence for imbalance, normal gait   Steps Alternating feet, no rail.   Total Score 30            Vestibular Assessment - 09/13/15 0001    Symptom Behavior   Type of Dizziness Unsteady with head/body turns   Frequency of Dizziness head movements   Duration of Dizziness only in the moment of the head turn   Aggravating Factors Looking up to the ceiling;Turning head quickly   Relieving Factors Rest;Slow movements   Occulomotor Exam   Occulomotor Alignment Normal   Spontaneous Absent   Gaze-induced --  slight L beating when tracking L, single beat   Vestibulo-Occular Reflex   VOR 1 Head Only (x 1 viewing) normal in both vertical and horizontal   VOR 2 Head and Object (x 2 viewing) WFL, no dizziness                       PT Education - 09/13/15 0836    Education provided Yes   Education Details evaluation findings, assessment of vestibular  deficits   Person(s) Educated Patient   Methods Explanation   Comprehension Verbalized understanding                    Plan - 09/13/15 0454    Clinical Impression Statement Pt presents s/p diagnosis of Guillian Barre Syndrome and was admitted to hospital from 08/28/15-09/03/15 and received IVIG while in hospital.  Note  pt with increased pain in L shoulder from previous injury, however states he feels that pain is somewhat related to GBS due to "shooting" pains.   Pt with no significant past medical medical history.  Upon PT evaluation, note gait speed normal and no risk of falls per FGA score of 30/30, however did note some dizziness with head turns.  Unable to provoke in sitting and all vestibular assessment WFL.  Education to increase ambulation and add head turns for adaptation.  Pt verbalized understanding.  Pt will not require any further follow up from PT, therefore will D/C at this time.     Clinical Impairments Affecting Rehab Potential n/a   PT Next Visit Plan n/a   Consulted and Agree with Plan of Care Patient         Problem List Patient Active Problem List   Diagnosis Date Noted  . GBS (Guillain Barre syndrome) (HCC)   . Paresthesias 08/29/2015  . Hand weakness 08/29/2015  . Paresthesia 08/29/2015    Harriet Butte, PT, MPT [facility] [address] [place], [place], [address] Phone: [phone]   Fax:  [phone] 09/13/2015, 8:43 AM  Name: Dante Roudebush MRN: [id number] Date of Birth: [birth year]

## 2015-09-30 ENCOUNTER — Ambulatory Visit (INDEPENDENT_AMBULATORY_CARE_PROVIDER_SITE_OTHER): Payer: Federal, State, Local not specified - PPO | Admitting: Neurology

## 2015-09-30 ENCOUNTER — Encounter: Payer: Self-pay | Admitting: Neurology

## 2015-09-30 VITALS — BP 140/90 | HR 75 | Wt 207.1 lb

## 2015-09-30 DIAGNOSIS — G610 Guillain-Barre syndrome: Secondary | ICD-10-CM | POA: Diagnosis not present

## 2015-09-30 NOTE — Progress Notes (Signed)
[facility] Clinic Note - Initial Visit   Date: 09/30/2015  Aloysious Vangieson MRN: [id number] DOB: 1961-01-24   Dear Dr. Orland Mustard:  Thank you for your kind referral of Amirr Achord for consultation of Guillain-Barre Syndrome. Although his history is well known to you, please allow [place] to reiterate it for the purpose of our medical record. The patient was accompanied to the clinic by self.    History of Present Illness: Milus Fritze is a 55 y.o. right-handed Caucasian male with no prior medical history presenting for evaluation of recently episode of generalized weakness and paresthesias, concerning for Guillian-Barre syndrome.    The patient was in his usual state of health until January 5th 2017 when he developed acute onset of numbness involving the left fingertips.  The following morning, he has sensation "prickly" and "pins and needles" all over his whole body (face, mouth, trunk, arms, legs).  He went to his PCP who recommended prednisone, but there was no benefit.  Three days later he presented to [facility] ER after developing weakness with hand grip, weakness of his eyes looking up (no diplopia), weakness of his legs, dysphagia, weak cough, soft voice, and difficulty with defecation.    He reports having a rash on his neck which appeared several times since November 2016, but would always self-resolve.  Otherwise, no tick bites, illnesses, or sick contact.  He travels to [place] for work and last travelled in December.  He does not recall being bit by mosquitos.   He was hospitalized from January 7th thru January 13th 2017.  Serology testing for HIV, Lyme, heavy metal screen, and CSF testing returned normal.  An MRI of the brain, and cervical/thoracic spinal cord was unremarkable (mild degenerative changes).  His exam was notable for diffuse hyporeflexia and generalized weakness and due to high clinical suspicion for Guillain-Barre Syndrome, he was treated with  5-days of IVIG. Since the first treatment if IVIG, he began noticing improvement immediately especially with weakness and over the next few days, his paresthesias also started improving.  Within a week of discharge, he felt completely back to himself.  He had one fall since his hospitalization which occurred in the setting of a dog coming in between his feet.  He went to out-patient OT and PT for one session, but was already doing very well and was discharged.    Out-side paper records, electronic medical record, and images have been reviewed where available and summarized as:  Labs 08/31/2015:  HIV neg, ESR 4, CRP 1.1, heavy metal screen negative, Lyme neg CSF 08/28/2014:  W0 R2  G60  P 33  No OCB  MRI brain and cervical spine wwo contrast 08/28/2015:  MRI BRAIN:  Normal MRI of the brain with and without contrast.   MRI CERVICAL SPINE: Straightened cervical lordosis without acute fracture or malalignment. Normal contrast-enhanced MRI of the cervical spinal cord.   Degenerative change of the cervical spine superimposed on borderline congenital canal narrowing. Mild canal stenosis C4-5 through C6-7.  Neural foraminal narrowing C3-4 through C5-6, C7-T1: Moderate to severe on the RIGHT at C3-4 and on the LEFT at C4-5.   MRI thoracic spine wwo contrast 08/28/2015: Early degenerative change of thoracic spine without acute fracture nor malalignment. No neurocompressive changes. Normal MRI of the thoracic spinal cord with and without contrast.  Past Medical History:  None  Past Surgical History:  None   Medications:  Outpatient Encounter Prescriptions as of 09/30/2015  Medication Sig  . fluticasone (FLONASE)  50 MCG/ACT nasal spray Place 1 spray into both nostrils daily as needed for allergies or rhinitis.  . Multiple Vitamin (MULTIVITAMIN) tablet Take 1 tablet by mouth daily.  . [DISCONTINUED] docusate sodium (COLACE) 100 MG capsule Take 2 capsules (200 mg total) by mouth 2 (two) times daily. (Patient  not taking: Reported on 09/13/2015)  . [DISCONTINUED] ibuprofen (ADVIL,MOTRIN) 100 MG tablet Take 500 mg by mouth every 6 (six) hours as needed for fever.  . [DISCONTINUED] Phenyleph-Doxylamine-DM-APAP (ALKA-SELTZER PLS NIGHT CLD/FLU) 5-6.25-10-325 MG CAPS Take 2 capsules by mouth at bedtime as needed (for cold). Reported on 09/13/2015   No facility-administered encounter medications on file as of 09/30/2015.     Allergies: No Known Allergies  Family History: Family History  Problem Relation Age of Onset  . Cancer Mother     Breast  . Cancer Father     Lung  . Diabetes Paternal Uncle     Social History: Social History  Substance Use Topics  . Smoking status: Current Some Day Smoker    Types: Cigars  . Smokeless tobacco: None  . Alcohol Use: No   Social History   Social History Narrative    Review of Systems:  CONSTITUTIONAL: No fevers, chills, night sweats, or weight loss.  EYES: No visual changes or eye pain ENT: No hearing changes.  No history of nose bleeds.   RESPIRATORY: No cough, wheezing and shortness of breath.   CARDIOVASCULAR: Negative for chest pain, and palpitations.   GI: Negative for abdominal discomfort, blood in stools or black stools.  No recent change in bowel habits.   GU:  No history of incontinence.   MUSCLOSKELETAL: No history of joint pain or swelling.  No myalgias.   SKIN: Negative for lesions, rash, and itching.   HEMATOLOGY/ONCOLOGY: Negative for prolonged bleeding, bruising easily, and swollen nodes.  No history of cancer.   ENDOCRINE: Negative for cold or heat intolerance, polydipsia or goiter.   PSYCH:  No depression or anxiety symptoms.   NEURO: As Above.   Vital Signs:  BP 140/90 mmHg  Pulse 75  Wt 207 lb 2 oz (93.951 kg)  SpO2 96% Pain Scale: 0 on a scale of 0-10   General Medical Exam:   General:  Well appearing, comfortable.   Eyes/ENT: see cranial nerve examination.   Neck: No masses appreciated.  Full range of motion without  tenderness.  No carotid bruits. Respiratory:  Clear to auscultation, good air entry bilaterally.   Cardiac:  Regular rate and rhythm, no murmur.   Extremities:  No deformities, edema, or skin discoloration.  Skin:  No rashes or lesions.  Neurological Exam: MENTAL STATUS including orientation to time, place, person, recent and remote memory, attention span and concentration, language, and fund of knowledge is normal.  Speech is not dysarthric.  CRANIAL NERVES: II:  No visual field defects.  Unremarkable fundi.   III-IV-VI: Pupils equal round and reactive to light.  Normal conjugate, extra-ocular eye movements in all directions of gaze.  No nystagmus.  No ptosis.  V:  Normal facial sensation.   VII:  Normal facial symmetry and movements.  No pathologic facial reflexes.  VIII:  Normal hearing and vestibular function.   IX-X:  Normal palatal movement.   XI:  Normal shoulder shrug and head rotation.   XII:  Normal tongue strength and range of motion, no deviation or fasciculation.  MOTOR:  No atrophy, fasciculations or abnormal movements.  No pronator drift.  Tone is normal.    Right  Upper Extremity:    Left Upper Extremity:    Deltoid  5/5   Deltoid  5/5   Biceps  5/5   Biceps  5/5   Triceps  5/5   Triceps  5/5   Wrist extensors  5/5   Wrist extensors  5/5   Wrist flexors  5/5   Wrist flexors  5/5   Finger extensors  5/5   Finger extensors  5/5   Finger flexors  5/5   Finger flexors  5/5   Dorsal interossei  5/5   Dorsal interossei  5/5   Abductor pollicis  5/5   Abductor pollicis  5/5   Tone (Ashworth scale)  0  Tone (Ashworth scale)  0   Right Lower Extremity:    Left Lower Extremity:    Hip flexors  5/5   Hip flexors  5/5   Hip extensors  5/5   Hip extensors  5/5   Knee flexors  5/5   Knee flexors  5/5   Knee extensors  5/5   Knee extensors  5/5   Dorsiflexors  5/5   Dorsiflexors  5/5   Plantarflexors  5/5   Plantarflexors  5/5   Toe extensors  5/5   Toe extensors  5/5   Toe  flexors  5/5   Toe flexors  5/5   Tone (Ashworth scale)  0  Tone (Ashworth scale)  0   MSRs:  Right                                                                 Left brachioradialis 2+  brachioradialis 2+  biceps 2+  biceps 2+  triceps 2+  triceps 2+  patellar 2+  patellar 2+  ankle jerk 2+  ankle jerk 2+  Hoffman no  Hoffman no  plantar response down  plantar response down   SENSORY:  Normal and symmetric perception of light touch, pinprick, vibration, and proprioception.  Romberg's sign absent.   COORDINATION/GAIT: Normal finger-to- nose-finger and heel-to-shin.  Intact rapid alternating movements bilaterally.  Able to rise from a chair without using arms.  Gait narrow based and stable. Tandem and stressed gait intact.    IMPRESSION: Mr. Presley is a 55 year-old gentleman who presented to [facility] ER with progressive generalized paresthesias, weakness, dysphagia, and dysarthria whose work-up led to the diagnosis of clinically suspected Guillain-Barre syndrome based on clinical exam and history.  His CSF protein was normal.  He had dramatic improvement with IVIG and is currently back to baseline without any neurological deficits, including normal reflexes. No need for additional testing at this time.  If he develops new symptoms, NCS/EMG can be done.  Etiology remains is most likely parainfectious as he reports having a viral illness around the time of symptom onset.  He does travel to [place], but denies mosquito bites so less likely to be Zika-related.  Return to clinic as needed   The duration of this appointment visit was 40 minutes of face-to-face time with the patient.  Greater than 50% of this time was spent in counseling, explanation of diagnosis, planning of further management, and coordination of care.   Thank you for allowing me to participate in patient's care.  If I can answer any additional questions, I would  be pleased to do so.    Sincerely,    Lillieann Pavlich K. Posey Pronto,  DO

## 2015-11-24 ENCOUNTER — Emergency Department (HOSPITAL_COMMUNITY)
Admission: EM | Admit: 2015-11-24 | Discharge: 2015-11-24 | Disposition: A | Discharge status: Home / Self Care | Payer: Federal, State, Local not specified - PPO | Attending: Emergency Medicine | Admitting: Emergency Medicine

## 2015-11-24 ENCOUNTER — Encounter (HOSPITAL_COMMUNITY): Payer: Self-pay | Admitting: Emergency Medicine

## 2015-11-24 ENCOUNTER — Emergency Department (HOSPITAL_COMMUNITY)
Admission: EM | Admit: 2015-11-24 | Discharge: 2015-11-25 | Disposition: A | Discharge status: Home / Self Care | Payer: Federal, State, Local not specified - PPO | Source: Home / Self Care | Attending: Emergency Medicine | Admitting: Emergency Medicine

## 2015-11-24 DIAGNOSIS — X58XXXA Exposure to other specified factors, initial encounter: Secondary | ICD-10-CM | POA: Diagnosis not present

## 2015-11-24 DIAGNOSIS — T783XXA Angioneurotic edema, initial encounter: Secondary | ICD-10-CM | POA: Diagnosis not present

## 2015-11-24 DIAGNOSIS — I10 Essential (primary) hypertension: Secondary | ICD-10-CM | POA: Diagnosis not present

## 2015-11-24 DIAGNOSIS — Z87891 Personal history of nicotine dependence: Secondary | ICD-10-CM | POA: Diagnosis not present

## 2015-11-24 DIAGNOSIS — Y9289 Other specified places as the place of occurrence of the external cause: Secondary | ICD-10-CM | POA: Insufficient documentation

## 2015-11-24 DIAGNOSIS — Y998 Other external cause status: Secondary | ICD-10-CM | POA: Insufficient documentation

## 2015-11-24 DIAGNOSIS — Y9389 Activity, other specified: Secondary | ICD-10-CM | POA: Insufficient documentation

## 2015-11-24 DIAGNOSIS — Z8669 Personal history of other diseases of the nervous system and sense organs: Secondary | ICD-10-CM | POA: Insufficient documentation

## 2015-11-24 DIAGNOSIS — Z79899 Other long term (current) drug therapy: Secondary | ICD-10-CM | POA: Insufficient documentation

## 2015-11-24 DIAGNOSIS — R220 Localized swelling, mass and lump, head: Secondary | ICD-10-CM | POA: Diagnosis present

## 2015-11-24 HISTORY — DX: Guillain-Barre syndrome: G61.0

## 2015-11-24 HISTORY — DX: Essential (primary) hypertension: I10

## 2015-11-24 MED ORDER — FAMOTIDINE IN NACL 20-0.9 MG/50ML-% IV SOLN
20.0000 mg | Freq: Once | INTRAVENOUS | Status: AC
  Administered 2015-11-24: 20 mg via INTRAVENOUS
  Filled 2015-11-24: qty 50

## 2015-11-24 MED ORDER — SODIUM CHLORIDE 0.9 % IV BOLUS (SEPSIS)
500.0000 mL | Freq: Once | INTRAVENOUS | Status: AC
  Administered 2015-11-24: 500 mL via INTRAVENOUS

## 2015-11-24 MED ORDER — DIPHENHYDRAMINE HCL 50 MG/ML IJ SOLN
25.0000 mg | Freq: Once | INTRAMUSCULAR | Status: AC
  Administered 2015-11-24: 25 mg via INTRAVENOUS
  Filled 2015-11-24: qty 1

## 2015-11-24 MED ORDER — METHYLPREDNISOLONE SODIUM SUCC 125 MG IJ SOLR
125.0000 mg | Freq: Once | INTRAMUSCULAR | Status: AC
  Administered 2015-11-24: 125 mg via INTRAVENOUS
  Filled 2015-11-24: qty 2

## 2015-11-24 NOTE — ED Provider Notes (Signed)
CSN: [id number]     Arrival date & time 11/24/15  2236 History  By signing my name below, I, Steven Steele, attest that this documentation has been prepared under the direction and in the presence of Steven Crease, MD. Electronically Signed: Bethel Steele, ED Scribe. 11/25/2015. 12:19 AM   Chief Complaint  Patient presents with  . Allergic Reaction   The history is provided by the patient. No language interpreter was used.   Steven Steele is a 55 y.o. male who presents to the Emergency Department complaining of new and gradually worsening lower lip and throat swelling with onset 5 hours ago. He denies new medication and states that the only change in his routine today was eating a pre-packaged lobster bisque. He has had lobster before and does not believe that this caused his symptoms.  Pt denies rash and difficulty breathing.    Past Medical History  Diagnosis Date  . Guillain-Barre (HCC)   . Hypertension    History reviewed. No pertinent past surgical history. Family History  Problem Relation Age of Onset  . Cancer Mother     Breast  . Cancer Father     Lung  . Diabetes Paternal Uncle   . Healthy Brother   . Healthy Sister    Social History  Substance Use Topics  . Smoking status: Former Smoker    Types: Cigars  . Smokeless tobacco: Never Used  . Alcohol Use: No    Review of Systems  HENT: Negative for drooling and trouble swallowing.        Lower lip and throat swelling   Respiratory: Negative for apnea, shortness of breath, wheezing and stridor.   Skin: Negative for rash.  All other systems reviewed and are negative.     Allergies  Review of patient's allergies indicates no known allergies.  Home Medications   Prior to Admission medications   Medication Sig Start Date End Date Taking? Authorizing Provider  diphenhydrAMINE (SOMINEX) 25 MG tablet Take 25 mg by mouth once.   Yes Historical Provider, MD  fluticasone (FLONASE) 50 MCG/ACT nasal spray  Place 1 spray into both nostrils daily as needed for allergies or rhinitis.   Yes Historical Provider, MD  Multiple Vitamin (MULTIVITAMIN) tablet Take 1 tablet by mouth daily.   Yes Historical Provider, MD  predniSONE (DELTASONE) 20 MG tablet 3 tabs po daily x 3 days, then 2 tabs x 3 days, then 1.5 tabs x 3 days, then 1 tab x 3 days, then 0.5 tabs x 3 days 11/25/15   Steven Crease, MD   BP 165/100 mmHg  Pulse 70  Temp(Src) 97.8 F (36.6 C) (Oral)  Resp 16  Ht  (1.727 m)  Wt 205 lb (92.987 kg)  BMI 31.18 kg/m2  SpO2 99% Physical Exam  Constitutional: He is oriented to person, place, and time. He appears well-developed and well-nourished. No distress.  HENT:  Head: Normocephalic and atraumatic.  Right Ear: Hearing normal.  Left Ear: Hearing normal.  Nose: Nose normal.  Mouth/Throat: Oropharynx is clear and moist and mucous membranes are normal. Uvula swelling present.  Eyes: Conjunctivae and EOM are normal. Pupils are equal, round, and reactive to light.  Neck: Normal range of motion. Neck supple.  Cardiovascular: Regular rhythm, S1 normal and S2 normal.  Exam reveals no gallop and no friction rub.   No murmur heard. Pulmonary/Chest: Effort normal and breath sounds normal. No respiratory distress. He exhibits no tenderness.  Abdominal: Soft. Normal appearance and bowel sounds  are normal. There is no hepatosplenomegaly. There is no tenderness. There is no rebound, no guarding, no tenderness at McBurney's point and negative Murphy's sign. No hernia.  Musculoskeletal: Normal range of motion.  Neurological: He is alert and oriented to person, place, and time. He has normal strength. No cranial nerve deficit or sensory deficit. Coordination normal. GCS eye subscore is 4. GCS verbal subscore is 5. GCS motor subscore is 6.  Skin: Skin is warm, dry and intact. No rash noted. No cyanosis.  Psychiatric: He has a normal mood and affect. His speech is normal and behavior is normal. Thought  content normal.  Nursing note and vitals reviewed.   ED Course  Procedures (including critical care time) DIAGNOSTIC STUDIES: Oxygen Saturation is 99% on RA,  normal by my interpretation.    COORDINATION OF CARE: 11:55 PM Discussed treatment plan which includes Benadryl, Pepcid, and Solu-medrol with pt at bedside and pt agreed to plan.  Labs Review Labs Reviewed - No data to display  Imaging Review No results found.    EKG Interpretation None      MDM   Final diagnoses:  Angioedema, initial encounter    Presents to emergency para for evaluation of throat swelling. Patient reports that he had onset of swelling after eating lobster. He initially had swelling of his lip, tongue and throat, but this improved after he took Benadryl. Examination here in the ER revealed uvular edema, no other significant angioedema. He was administered treatment for allergic reaction and observed for an extended period of time. Symptoms remain mild and he is appropriate for discharge and outpatient treatment. He was given return precautions.  I personally performed the services described in this documentation, which was scribed in my presence. The recorded information has been reviewed and is accurate.    Steven Creasehristopher J Jatasia Gundrum, MD 12/17/15 [phone]

## 2015-11-24 NOTE — ED Provider Notes (Addendum)
MSE was initiated and I personally evaluated the patient and placed orders (if any) at  11:19 PM on November 24, 2015.  Patient presenting for onset of lower lip and throat swelling tonight. Called by triage nurse to evaluate and screen patient. He appears to have moderate to significant uvula edema. Patient reports improvement in R lower lip swelling since 25mg  PO Benadryl. He is tolerating secretions. No tripoding. No distress. Initial orders placed in triage for care. He denies any new known exposures other than a new pre-packaged lobster bisque he ate this evening. He is not on any ACE inhibitors. He reports hx of Guillain Barre.  The patient appears stable so that the remainder of the MSE may be completed by another provider.   Antony MaduraKelly Humes, PA-C 11/24/15 2324  Gilda Creasehristopher J Pollina, MD 11/25/15 [phone]  Gilda Creasehristopher J Pollina, MD 11/29/15 [phone]

## 2015-11-24 NOTE — ED Notes (Signed)
Pt presents with swelling to throat, tongue and lips, pt was having difficulty swallowing but did take 1 PO Benadryl @2130 .

## 2015-11-25 MED ORDER — PREDNISONE 20 MG PO TABS: ORAL_TABLET | ORAL | Status: DC

## 2015-11-25 NOTE — Discharge Instructions (Signed)
Angioedema  Angioedema is a sudden swelling of tissues, often of the skin. It can occur on the face or genitals or in the abdomen or other body parts. The swelling usually develops over a short period and gets better in 24 to 48 hours. It often begins during the night and is found when the person wakes up. The person may also get red, itchy patches of skin (hives). Angioedema can be dangerous if it involves swelling of the air passages.   Depending on the cause, episodes of angioedema may only happen once, come back in unpredictable patterns, or repeat for several years and then gradually fade away.   CAUSES   Angioedema can be caused by an allergic reaction to various triggers. It can also result from nonallergic causes, including reactions to drugs, immune system disorders, viral infections, or an abnormal gene that is passed to you from your parents (hereditary). For some people with angioedema, the cause is unknown.   Some things that can trigger angioedema include:    Foods.    Medicines, such as ACE inhibitors, ARBs, nonsteroidal anti-inflammatory agents, or estrogen.    Latex.    Animal saliva.    Insect stings.    Dyes used in X-rays.    Mild injury.    Dental work.   Surgery.   Stress.    Sudden changes in temperature.    Exercise.  SIGNS AND SYMPTOMS    Swelling of the skin.   Hives. If these are present, there is also intense itching.   Redness in the affected area.    Pain in the affected area.   Swollen lips or tongue.   Breathing problems. This may happen if the air passages swell.   Wheezing.  If internal organs are involved, there may be:    Nausea.    Abdominal pain.    Vomiting.    Difficulty swallowing.    Difficulty passing urine.  DIAGNOSIS    Your health care provider will examine the affected area and take a medical and family history.   Various tests may be done to help determine the cause. Tests may include:   Allergy skin tests to see if the problem  is an allergic reaction.    Blood tests to check for hereditary angioedema.    Tests to check for underlying diseases that could cause the condition.    A review of your medicines, including over-the-counter medicines, may be done.  TREATMENT   Treatment will depend on the cause of the angioedema. Possible treatments include:    Removal of anything that triggered the condition (such as stopping certain medicines).    Medicines to treat symptoms or prevent attacks. Medicines given may include:     Antihistamines.     Epinephrine injection.     Steroids.    Hospitalization may be required for severe attacks. If the air passages are affected, it can be an emergency. Tubes may need to be placed to keep the airway open.  HOME CARE INSTRUCTIONS    Take all medicines as directed by your health care provider.   If you were given medicines for emergency allergy treatment, always carry them with you.   Wear a medical bracelet as directed by your health care provider.    Avoid known triggers.  SEEK MEDICAL CARE IF:    You have repeat attacks of angioedema.    Your attacks are more frequent or more severe despite preventive measures.    You have hereditary angioedema   and are considering having children. It is important to discuss with your health care provider the risks of passing the condition on to your children.  SEEK IMMEDIATE MEDICAL CARE IF:    You have severe swelling of the mouth, tongue, or lips.   You have difficulty breathing.    You have difficulty swallowing.    You faint.  MAKE SURE YOU:   Understand these instructions.   Will watch your condition.   Will get help right away if you are not doing well or get worse.     This information is not intended to replace advice given to you by your health care provider. Make sure you discuss any questions you have with your health care provider.     Document Released: 10/16/2001 Document Revised: 08/28/2014 Document Reviewed:  03/31/2013  Elsevier Interactive Patient Education 2016 [employer]

## 2016-06-06 DIAGNOSIS — R7309 Other abnormal glucose: Secondary | ICD-10-CM | POA: Diagnosis not present

## 2016-06-06 DIAGNOSIS — J029 Acute pharyngitis, unspecified: Secondary | ICD-10-CM | POA: Diagnosis not present

## 2016-06-06 DIAGNOSIS — R220 Localized swelling, mass and lump, head: Secondary | ICD-10-CM | POA: Diagnosis not present

## 2016-07-07 DIAGNOSIS — R350 Frequency of micturition: Secondary | ICD-10-CM | POA: Diagnosis not present

## 2016-07-07 DIAGNOSIS — R030 Elevated blood-pressure reading, without diagnosis of hypertension: Secondary | ICD-10-CM | POA: Diagnosis not present

## 2016-07-07 DIAGNOSIS — R109 Unspecified abdominal pain: Secondary | ICD-10-CM | POA: Diagnosis not present

## 2016-07-07 DIAGNOSIS — Z125 Encounter for screening for malignant neoplasm of prostate: Secondary | ICD-10-CM | POA: Diagnosis not present

## 2016-07-11 ENCOUNTER — Ambulatory Visit (INDEPENDENT_AMBULATORY_CARE_PROVIDER_SITE_OTHER): Payer: Federal, State, Local not specified - PPO | Admitting: Allergy and Immunology

## 2016-07-11 ENCOUNTER — Encounter: Payer: Self-pay | Admitting: Allergy and Immunology

## 2016-07-11 ENCOUNTER — Encounter (INDEPENDENT_AMBULATORY_CARE_PROVIDER_SITE_OTHER): Payer: Self-pay

## 2016-07-11 VITALS — BP 148/96 | HR 76 | Temp 98.0°F | Resp 20 | Ht 67.5 in | Wt 214.0 lb

## 2016-07-11 DIAGNOSIS — J3089 Other allergic rhinitis: Secondary | ICD-10-CM | POA: Diagnosis not present

## 2016-07-11 DIAGNOSIS — T783XXA Angioneurotic edema, initial encounter: Secondary | ICD-10-CM | POA: Diagnosis not present

## 2016-07-11 DIAGNOSIS — R030 Elevated blood-pressure reading, without diagnosis of hypertension: Secondary | ICD-10-CM | POA: Diagnosis not present

## 2016-07-11 DIAGNOSIS — T7840XA Allergy, unspecified, initial encounter: Secondary | ICD-10-CM | POA: Diagnosis not present

## 2016-07-11 LAB — COMPREHENSIVE METABOLIC PANEL
ALT: 26 U/L (ref 9–46)
AST: 20 U/L (ref 10–35)
Albumin: 4.8 g/dL (ref 3.6–5.1)
Alkaline Phosphatase: 73 U/L (ref 40–115)
BUN: 16 mg/dL (ref 7–25)
CO2: 28 mmol/L (ref 20–31)
Calcium: 10 mg/dL (ref 8.6–10.3)
Chloride: 103 mmol/L (ref 98–110)
Creat: 0.83 mg/dL (ref 0.70–1.33)
Glucose, Bld: 90 mg/dL (ref 65–99)
Potassium: 4 mmol/L (ref 3.5–5.3)
Sodium: 141 mmol/L (ref 135–146)
Total Bilirubin: 0.8 mg/dL (ref 0.2–1.2)
Total Protein: 7.2 g/dL (ref 6.1–8.1)

## 2016-07-11 NOTE — Assessment & Plan Note (Signed)
   Continue appropriate allergen avoidance measures.  Continue fluticasone nasal spray, 1-2 sprays daily as needed.

## 2016-07-11 NOTE — Assessment & Plan Note (Signed)
   Molly MaduroRobert has been made aware of the elevated blood pressure reading and has been asked to follow up with his primary care physician regarding this issue.  He has verbalized understanding and has agreed to do so.

## 2016-07-11 NOTE — Assessment & Plan Note (Addendum)
The patient's history suggests allergic reaction with an unclear trigger versus recurrent angioedema. Food allergen skin tests were negative today despite a positive histamine control. The negative predictive value for skin tests is excellent (greater than 95%).   We have attempted to retrieve notes from his previous allergy evaluation.  The following labs have been ordered: FCeRI antibody, TSH, anti-thyroglobulin antibody, thyroid peroxidase antibody, baseline serum tryptase, C4, C1 esterase inhibitor (quantitative and functional), C1q, CMP, ESR, ANA, and serum specific IgE against shellfish panel and galactose-alpha-1,3-galactose. He recently had a CBC drawn which was unremarkable.  An additional lab order for serum tryptase has been provided which is to be kept by the patient to be drawn in the emergency department within 4 hours of symptom onset should symptoms recur.  Should symptoms recur, the patient has been asked to keep a journal to record any foods eaten, beverages consumed, medications taken within a 6 hour period prior to the onset of symptoms, as well as record activities being performed, and environmental conditions. For any symptoms concerning for anaphylaxis, epinephrine is to be administered and 911 is to be called immediately.

## 2016-07-11 NOTE — Assessment & Plan Note (Deleted)
   Steven Steele has been made aware of the elevated blood pressure reading and has been asked to follow up with his primary care physician regarding this issue.  He has verbalized understanding and has agreed to do so. 

## 2016-07-11 NOTE — Patient Instructions (Addendum)
Allergic reaction The patient's history suggests allergic reaction with an unclear trigger versus recurrent angioedema. Food allergen skin tests were negative today despite a positive histamine control. The negative predictive value for skin tests is excellent (greater than 95%).   We have attempted to retrieve notes from his previous allergy evaluation.  The following labs have been ordered: FCeRI antibody, TSH, anti-thyroglobulin antibody, thyroid peroxidase antibody, baseline serum tryptase, C4, C1 esterase inhibitor (quantitative and functional), C1q, CMP, ESR, ANA, and serum specific IgE against shellfish panel and galactose-alpha-1,3-galactose. He recently had a CBC drawn which was unremarkable.  An additional lab order for serum tryptase has been provided which is to be kept by the patient to be drawn in the emergency department within 4 hours of symptom onset should symptoms recur.  Should symptoms recur, the patient has been asked to keep a journal to record any foods eaten, beverages consumed, medications taken within a 6 hour period prior to the onset of symptoms, as well as record activities being performed, and environmental conditions. For any symptoms concerning for anaphylaxis, epinephrine is to be administered and 911 is to be called immediately.  Perennial allergic rhinitis  Continue appropriate allergen avoidance measures.  Continue fluticasone nasal spray, 1-2 sprays daily as needed.  Elevated blood pressure reading  Steven Steele has been made aware of the elevated blood pressure reading and has been asked to follow up with his primary care physician regarding this issue.  He has verbalized understanding and has agreed to do so.   When lab results have returned the patient will be called with further recommendations and follow up instructions.

## 2016-07-11 NOTE — Progress Notes (Signed)
New Patient Note  RE: Steven Steele MRN: [id number] DOB: [birth year] Date of Office Visit: 07/11/2016  Referring provider: London Pepper, MD Primary care provider: London Pepper, MD  Chief Complaint: Allergic Reaction and Angioedema   History of present illness: Steven Steele is a 55 y.o. male seen today in consultation requested by London Pepper, MD. He reports that approximately one year ago he was working on his computer late in the evening and "out of nowhere" developed the sensation of throat tightness.  He went to the emergency department and was treated with corticosteroids and antihistamines and was released asymptomatic a few hours later.  He has no recollection of medications he had taken or foods he had consumed prior to the event.  In the interval since that time he has experienced 3 similar episodes.  On 11/24/2015, he was seen in the [facility] emergency department for lip swelling and throat tightness.  Uvula edema was noted on examination. In October, he was in [place] for a wedding and consumed a seafood plate.  The next day he developed the sensation of throat tightness which resolved with prednisone.  He denies concomitant urticaria, cardiopulmonary symptoms, or other GI symptoms. He does not believe that he has ever been on an ACE inhibitor.  He is unaware of any family members with a history of angioedema.  He evaluated by Dr. Donneta Romberg with [facility] a few months ago. He reports that after skin testing he was informed that the symptomatic episodes were not allergy related and had no further workup at that time.  He was hospitalized for one week in January 2017 with a diagnosis of Guillain-Barr syndrome. He denies symptoms associated with GBS coinciding with the episodes of lip swelling or throat tightness.  He reports that he experiences occasional nasal congestion and rare episodes ear pressure associated with  vertigo.  Fluticasone nasal spray has provided  significant benefit.   Assessment and plan: Allergic reaction The patient's history suggests allergic reaction with an unclear trigger versus recurrent angioedema. Food allergen skin tests were negative today despite a positive histamine control. The negative predictive value for skin tests is excellent (greater than 95%).   We have attempted to retrieve notes from his previous allergy evaluation.  The following labs have been ordered: FCeRI antibody, TSH, anti-thyroglobulin antibody, thyroid peroxidase antibody, baseline serum tryptase, C4, C1 esterase inhibitor (quantitative and functional), C1q, CMP, ESR, ANA, and serum specific IgE against shellfish panel and galactose-alpha-1,3-galactose. He recently had a CBC drawn which was unremarkable.  An additional lab order for serum tryptase has been provided which is to be kept by the patient to be drawn in the emergency department within 4 hours of symptom onset should symptoms recur.  Should symptoms recur, the patient has been asked to keep a journal to record any foods eaten, beverages consumed, medications taken within a 6 hour period prior to the onset of symptoms, as well as record activities being performed, and environmental conditions. For any symptoms concerning for anaphylaxis, epinephrine is to be administered and 911 is to be called immediately.  Perennial allergic rhinitis  Continue appropriate allergen avoidance measures.  Continue fluticasone nasal spray, 1-2 sprays daily as needed.  Elevated blood pressure reading  Steven Steele has been made aware of the elevated blood pressure reading and has been asked to follow up with his primary care physician regarding this issue.  He has verbalized understanding and has agreed to do so.   Diagnostics: Food allergen skin testing:  Negative  despite a positive histamine control.    Physical examination: Blood pressure (!) 148/96, pulse 76, temperature 98 F (36.7 C), temperature source  Oral, resp. rate 20, height 5' 7.5" (1.715 m), weight 214 lb (97.1 kg).  General: Alert, interactive, in no acute distress. HEENT: TMs pearly gray, turbinates mildly edematous without discharge, post-pharynx mildly erythematous. Neck: Supple without lymphadenopathy. Lungs: Clear to auscultation without wheezing, rhonchi or rales. CV: Normal S1, S2 without murmurs. Abdomen: Nondistended, nontender. Skin: Warm and dry, without lesions or rashes. Extremities:  No clubbing, cyanosis or edema. Neuro:   Grossly intact.  Review of systems:  Review of systems negative except as noted in HPI / PMHx or noted below: Review of Systems  Constitutional: Negative.   HENT: Negative.   Eyes: Negative.   Respiratory: Negative.   Cardiovascular: Negative.   Gastrointestinal: Negative.   Genitourinary: Negative.   Musculoskeletal: Negative.   Skin: Negative.   Neurological: Negative.   Endo/Heme/Allergies: Negative.   Psychiatric/Behavioral: Negative.     Past medical history:  Past Medical History:  Diagnosis Date  . Guillain-Barre ([place])   . Hypertension     Past surgical history:  Reviewed.  None reported.  Family history: Family History  Problem Relation Age of Onset  . Cancer Mother     Breast  . Cancer Father     Lung  . Diabetes Paternal Uncle   . Healthy Brother   . Healthy Sister     Social history: Social History   Social History  . Marital status: Significant Other    Spouse name: N/A  . Number of children: N/A  . Years of education: N/A   Occupational History  . Not on file.   Social History Main Topics  . Smoking status: Former Smoker    Types: Cigars  . Smokeless tobacco: Never Used  . Alcohol use No  . Drug use: No  . Sexual activity: Not on file   Other Topics Concern  . Not on file   Social History Narrative   Lives alone in a 2 story home.  Getting married in April.  No children.     Works as an Artist.     Environmental  History: The patient lives in a house with carpeting in the bedroom and central air/heat.  There are 2 dogs and 3 cats in the house which have access to his bedroom.  He does not smoke cigarettes but occasionally smokes cigars.    Medication List       Accurate as of 07/11/16 12:04 PM. Always use your most recent med list.          diphenhydrAMINE 25 MG tablet Commonly known as:  SOMINEX Take 25 mg by mouth once.   EPINEPHrine 0.3 mg/0.3 mL Soaj injection Commonly known as:  EPI-PEN   fluticasone 50 MCG/ACT nasal spray Commonly known as:  FLONASE Place 1 spray into both nostrils daily as needed for allergies or rhinitis.   multivitamin tablet Take 1 tablet by mouth daily.       Known medication allergies: No Known Allergies  I appreciate the opportunity to take part in Steven Steele's care. Please do not hesitate to contact me with questions.  Sincerely,   R. Edgar Frisk, MD

## 2016-07-12 LAB — ALLERGY-SHELLFISH PANEL
Clams: <0.1 kU/L
Crab: <0.1 kU/L
Lobster: <0.1 kU/L
Shrimp IgE: 0.14 kU/L — ABNORMAL HIGH

## 2016-07-12 LAB — C4 COMPLEMENT: C4 Complement: 33 mg/dL (ref 16–47)

## 2016-07-12 LAB — SEDIMENTATION RATE: Sed Rate: 4 mm/hr (ref 0–20)

## 2016-07-12 LAB — ANA: Anti Nuclear Antibody(ANA): NEGATIVE

## 2016-07-13 LAB — TRYPTASE: Tryptase: 7.7 ug/L (ref <11)

## 2016-07-15 LAB — COMPLEMENT COMPONENT C1Q: Complement C1Q: 5.7 mg/dL (ref 5.0–8.6)

## 2016-07-15 LAB — C1 ESTERASE INHIBITOR, FUNCTIONAL: C1INH Functional/C1INH Total MFr SerPl: >100 % (ref >=68)

## 2016-07-19 LAB — ALPHA-GAL PANEL
Beef IgE: <0.1 kU/L (ref <0.35)
Class: 0
Class: 0
Class: 0
Galactose-alpha-1,3-galactose IgE*: <0.1 kU/L (ref <0.35)
Lamb/Mutton IgE: <0.1 kU/L (ref <0.35)
Pork IgE: <0.1 kU/L (ref <0.35)

## 2017-01-02 DIAGNOSIS — Z125 Encounter for screening for malignant neoplasm of prostate: Secondary | ICD-10-CM | POA: Diagnosis not present

## 2017-01-02 DIAGNOSIS — Z13220 Encounter for screening for lipoid disorders: Secondary | ICD-10-CM | POA: Diagnosis not present

## 2017-01-02 DIAGNOSIS — Z0000 Encounter for general adult medical examination without abnormal findings: Secondary | ICD-10-CM | POA: Diagnosis not present

## 2017-01-02 DIAGNOSIS — R350 Frequency of micturition: Secondary | ICD-10-CM | POA: Diagnosis not present

## 2017-01-02 DIAGNOSIS — R030 Elevated blood-pressure reading, without diagnosis of hypertension: Secondary | ICD-10-CM | POA: Diagnosis not present

## 2017-02-07 DIAGNOSIS — R351 Nocturia: Secondary | ICD-10-CM | POA: Diagnosis not present

## 2017-02-07 DIAGNOSIS — I10 Essential (primary) hypertension: Secondary | ICD-10-CM | POA: Diagnosis not present

## 2017-02-07 DIAGNOSIS — R5383 Other fatigue: Secondary | ICD-10-CM | POA: Diagnosis not present

## 2017-02-08 DIAGNOSIS — G4733 Obstructive sleep apnea (adult) (pediatric): Secondary | ICD-10-CM | POA: Diagnosis not present

## 2017-02-28 DIAGNOSIS — G4733 Obstructive sleep apnea (adult) (pediatric): Secondary | ICD-10-CM | POA: Diagnosis not present

## 2017-02-28 DIAGNOSIS — I10 Essential (primary) hypertension: Secondary | ICD-10-CM | POA: Diagnosis not present

## 2017-02-28 DIAGNOSIS — W57XXXA Bitten or stung by nonvenomous insect and other nonvenomous arthropods, initial encounter: Secondary | ICD-10-CM | POA: Diagnosis not present

## 2017-02-28 DIAGNOSIS — L609 Nail disorder, unspecified: Secondary | ICD-10-CM | POA: Diagnosis not present

## 2017-03-20 DIAGNOSIS — G4733 Obstructive sleep apnea (adult) (pediatric): Secondary | ICD-10-CM | POA: Diagnosis not present

## 2017-04-10 DIAGNOSIS — G4733 Obstructive sleep apnea (adult) (pediatric): Secondary | ICD-10-CM | POA: Diagnosis not present

## 2017-04-11 DIAGNOSIS — N3281 Overactive bladder: Secondary | ICD-10-CM | POA: Diagnosis not present

## 2017-04-11 DIAGNOSIS — R351 Nocturia: Secondary | ICD-10-CM | POA: Diagnosis not present

## 2017-04-12 DIAGNOSIS — M9902 Segmental and somatic dysfunction of thoracic region: Secondary | ICD-10-CM | POA: Diagnosis not present

## 2017-04-12 DIAGNOSIS — M9903 Segmental and somatic dysfunction of lumbar region: Secondary | ICD-10-CM | POA: Diagnosis not present

## 2017-04-12 DIAGNOSIS — M62830 Muscle spasm of back: Secondary | ICD-10-CM | POA: Diagnosis not present

## 2017-04-12 DIAGNOSIS — M546 Pain in thoracic spine: Secondary | ICD-10-CM | POA: Diagnosis not present

## 2017-04-13 DIAGNOSIS — M546 Pain in thoracic spine: Secondary | ICD-10-CM | POA: Diagnosis not present

## 2017-04-13 DIAGNOSIS — M9903 Segmental and somatic dysfunction of lumbar region: Secondary | ICD-10-CM | POA: Diagnosis not present

## 2017-04-13 DIAGNOSIS — M9902 Segmental and somatic dysfunction of thoracic region: Secondary | ICD-10-CM | POA: Diagnosis not present

## 2017-04-13 DIAGNOSIS — M62830 Muscle spasm of back: Secondary | ICD-10-CM | POA: Diagnosis not present

## 2017-04-17 DIAGNOSIS — M546 Pain in thoracic spine: Secondary | ICD-10-CM | POA: Diagnosis not present

## 2017-04-17 DIAGNOSIS — M62830 Muscle spasm of back: Secondary | ICD-10-CM | POA: Diagnosis not present

## 2017-04-17 DIAGNOSIS — M9902 Segmental and somatic dysfunction of thoracic region: Secondary | ICD-10-CM | POA: Diagnosis not present

## 2017-04-17 DIAGNOSIS — M9903 Segmental and somatic dysfunction of lumbar region: Secondary | ICD-10-CM | POA: Diagnosis not present

## 2017-04-19 DIAGNOSIS — M9902 Segmental and somatic dysfunction of thoracic region: Secondary | ICD-10-CM | POA: Diagnosis not present

## 2017-04-19 DIAGNOSIS — M9903 Segmental and somatic dysfunction of lumbar region: Secondary | ICD-10-CM | POA: Diagnosis not present

## 2017-04-19 DIAGNOSIS — M62830 Muscle spasm of back: Secondary | ICD-10-CM | POA: Diagnosis not present

## 2017-04-19 DIAGNOSIS — M546 Pain in thoracic spine: Secondary | ICD-10-CM | POA: Diagnosis not present

## 2017-04-20 DIAGNOSIS — M9902 Segmental and somatic dysfunction of thoracic region: Secondary | ICD-10-CM | POA: Diagnosis not present

## 2017-04-20 DIAGNOSIS — M62830 Muscle spasm of back: Secondary | ICD-10-CM | POA: Diagnosis not present

## 2017-04-20 DIAGNOSIS — M546 Pain in thoracic spine: Secondary | ICD-10-CM | POA: Diagnosis not present

## 2017-04-20 DIAGNOSIS — M9903 Segmental and somatic dysfunction of lumbar region: Secondary | ICD-10-CM | POA: Diagnosis not present

## 2017-04-20 DIAGNOSIS — G4733 Obstructive sleep apnea (adult) (pediatric): Secondary | ICD-10-CM | POA: Diagnosis not present

## 2017-04-24 DIAGNOSIS — M9902 Segmental and somatic dysfunction of thoracic region: Secondary | ICD-10-CM | POA: Diagnosis not present

## 2017-04-24 DIAGNOSIS — M9903 Segmental and somatic dysfunction of lumbar region: Secondary | ICD-10-CM | POA: Diagnosis not present

## 2017-04-24 DIAGNOSIS — M546 Pain in thoracic spine: Secondary | ICD-10-CM | POA: Diagnosis not present

## 2017-04-24 DIAGNOSIS — M62830 Muscle spasm of back: Secondary | ICD-10-CM | POA: Diagnosis not present

## 2017-04-26 DIAGNOSIS — M62830 Muscle spasm of back: Secondary | ICD-10-CM | POA: Diagnosis not present

## 2017-04-26 DIAGNOSIS — M9902 Segmental and somatic dysfunction of thoracic region: Secondary | ICD-10-CM | POA: Diagnosis not present

## 2017-04-26 DIAGNOSIS — M546 Pain in thoracic spine: Secondary | ICD-10-CM | POA: Diagnosis not present

## 2017-04-26 DIAGNOSIS — M9903 Segmental and somatic dysfunction of lumbar region: Secondary | ICD-10-CM | POA: Diagnosis not present

## 2017-04-27 DIAGNOSIS — M62830 Muscle spasm of back: Secondary | ICD-10-CM | POA: Diagnosis not present

## 2017-04-27 DIAGNOSIS — M546 Pain in thoracic spine: Secondary | ICD-10-CM | POA: Diagnosis not present

## 2017-04-27 DIAGNOSIS — M9903 Segmental and somatic dysfunction of lumbar region: Secondary | ICD-10-CM | POA: Diagnosis not present

## 2017-04-27 DIAGNOSIS — M9902 Segmental and somatic dysfunction of thoracic region: Secondary | ICD-10-CM | POA: Diagnosis not present

## 2017-05-20 DIAGNOSIS — G4733 Obstructive sleep apnea (adult) (pediatric): Secondary | ICD-10-CM | POA: Diagnosis not present

## 2017-05-22 DIAGNOSIS — R351 Nocturia: Secondary | ICD-10-CM | POA: Diagnosis not present

## 2017-06-20 DIAGNOSIS — G4733 Obstructive sleep apnea (adult) (pediatric): Secondary | ICD-10-CM | POA: Diagnosis not present

## 2017-07-09 DIAGNOSIS — I10 Essential (primary) hypertension: Secondary | ICD-10-CM | POA: Diagnosis not present

## 2017-07-09 DIAGNOSIS — H938X3 Other specified disorders of ear, bilateral: Secondary | ICD-10-CM | POA: Diagnosis not present

## 2017-07-09 DIAGNOSIS — R350 Frequency of micturition: Secondary | ICD-10-CM | POA: Diagnosis not present

## 2017-07-09 DIAGNOSIS — Z23 Encounter for immunization: Secondary | ICD-10-CM | POA: Diagnosis not present

## 2017-07-17 DIAGNOSIS — H903 Sensorineural hearing loss, bilateral: Secondary | ICD-10-CM | POA: Diagnosis not present

## 2017-07-17 DIAGNOSIS — H6983 Other specified disorders of Eustachian tube, bilateral: Secondary | ICD-10-CM | POA: Diagnosis not present

## 2017-07-17 DIAGNOSIS — H9313 Tinnitus, bilateral: Secondary | ICD-10-CM | POA: Diagnosis not present

## 2017-07-17 DIAGNOSIS — M2669 Other specified disorders of temporomandibular joint: Secondary | ICD-10-CM | POA: Diagnosis not present

## 2017-07-20 DIAGNOSIS — G4733 Obstructive sleep apnea (adult) (pediatric): Secondary | ICD-10-CM | POA: Diagnosis not present

## 2017-09-20 DIAGNOSIS — G4733 Obstructive sleep apnea (adult) (pediatric): Secondary | ICD-10-CM | POA: Diagnosis not present

## 2017-10-18 DIAGNOSIS — G4733 Obstructive sleep apnea (adult) (pediatric): Secondary | ICD-10-CM | POA: Diagnosis not present

## 2017-11-22 DIAGNOSIS — G4733 Obstructive sleep apnea (adult) (pediatric): Secondary | ICD-10-CM | POA: Diagnosis not present

## 2017-11-29 DIAGNOSIS — K080 Exfoliation of teeth due to systemic causes: Secondary | ICD-10-CM | POA: Diagnosis not present

## 2018-03-06 DIAGNOSIS — G4733 Obstructive sleep apnea (adult) (pediatric): Secondary | ICD-10-CM | POA: Diagnosis not present

## 2018-03-06 DIAGNOSIS — R197 Diarrhea, unspecified: Secondary | ICD-10-CM | POA: Diagnosis not present

## 2018-03-06 DIAGNOSIS — I10 Essential (primary) hypertension: Secondary | ICD-10-CM | POA: Diagnosis not present

## 2018-03-11 DIAGNOSIS — R197 Diarrhea, unspecified: Secondary | ICD-10-CM | POA: Diagnosis not present

## 2018-03-19 DIAGNOSIS — N3000 Acute cystitis without hematuria: Secondary | ICD-10-CM | POA: Diagnosis not present

## 2018-03-19 DIAGNOSIS — R350 Frequency of micturition: Secondary | ICD-10-CM | POA: Diagnosis not present

## 2018-04-08 DIAGNOSIS — G4733 Obstructive sleep apnea (adult) (pediatric): Secondary | ICD-10-CM | POA: Diagnosis not present

## 2018-04-10 DIAGNOSIS — G4733 Obstructive sleep apnea (adult) (pediatric): Secondary | ICD-10-CM | POA: Diagnosis not present

## 2018-05-23 DIAGNOSIS — J069 Acute upper respiratory infection, unspecified: Secondary | ICD-10-CM | POA: Diagnosis not present

## 2018-05-23 DIAGNOSIS — I10 Essential (primary) hypertension: Secondary | ICD-10-CM | POA: Diagnosis not present

## 2018-06-03 DIAGNOSIS — K080 Exfoliation of teeth due to systemic causes: Secondary | ICD-10-CM | POA: Diagnosis not present

## 2018-06-06 DIAGNOSIS — Z23 Encounter for immunization: Secondary | ICD-10-CM | POA: Diagnosis not present

## 2018-06-06 DIAGNOSIS — Z125 Encounter for screening for malignant neoplasm of prostate: Secondary | ICD-10-CM | POA: Diagnosis not present

## 2018-06-06 DIAGNOSIS — E785 Hyperlipidemia, unspecified: Secondary | ICD-10-CM | POA: Diagnosis not present

## 2018-06-06 DIAGNOSIS — R350 Frequency of micturition: Secondary | ICD-10-CM | POA: Diagnosis not present

## 2018-06-06 DIAGNOSIS — Z0000 Encounter for general adult medical examination without abnormal findings: Secondary | ICD-10-CM | POA: Diagnosis not present

## 2018-06-14 ENCOUNTER — Encounter (HOSPITAL_COMMUNITY): Payer: Self-pay | Admitting: Emergency Medicine

## 2018-06-14 ENCOUNTER — Emergency Department (HOSPITAL_COMMUNITY)
Admission: EM | Admit: 2018-06-14 | Discharge: 2018-06-14 | Disposition: A | Discharge status: Home / Self Care | Payer: Federal, State, Local not specified - PPO | Attending: Emergency Medicine | Admitting: Emergency Medicine

## 2018-06-14 ENCOUNTER — Other Ambulatory Visit: Payer: Self-pay

## 2018-06-14 DIAGNOSIS — Z79899 Other long term (current) drug therapy: Secondary | ICD-10-CM | POA: Insufficient documentation

## 2018-06-14 DIAGNOSIS — I10 Essential (primary) hypertension: Secondary | ICD-10-CM | POA: Insufficient documentation

## 2018-06-14 DIAGNOSIS — L720 Epidermal cyst: Secondary | ICD-10-CM

## 2018-06-14 DIAGNOSIS — Z87891 Personal history of nicotine dependence: Secondary | ICD-10-CM | POA: Insufficient documentation

## 2018-06-14 DIAGNOSIS — L0211 Cutaneous abscess of neck: Secondary | ICD-10-CM | POA: Diagnosis not present

## 2018-06-14 DIAGNOSIS — R070 Pain in throat: Secondary | ICD-10-CM | POA: Insufficient documentation

## 2018-06-14 MED ORDER — LIDOCAINE-EPINEPHRINE (PF) 2 %-1:200000 IJ SOLN
20.0000 mL | Freq: Once | INTRAMUSCULAR | Status: AC
  Administered 2018-06-14: 20 mL via INTRADERMAL
  Filled 2018-06-14: qty 20

## 2018-06-14 NOTE — ED Provider Notes (Signed)
Patient placed in Quick Look pathway, seen and evaluated   Chief Complaint: abscess  HPI: Steven Steele is a 57 y.o. male who present to the ED with a red tender area to his neck that started 3 months ago. Patient reports it seems to be getting worse.   ROS: Skin: lesion neck  Physical Exam:  BP (!) 164/105   Pulse 83   Temp 98.7 F (37.1 C) (Oral)   Resp 18   SpO2 100%    Gen: No distress  Neuro: Awake and Alert  Skin: tender, raised red area to the anterior aspect of the neck.   Initiation of care has begun. The patient has been counseled on the process, plan, and necessity for staying for the completion/evaluation, and the remainder of the medical screening examination    Janne Napoleon, NP 06/14/18 1807    Raeford Razor, MD 06/15/18 1718

## 2018-06-14 NOTE — ED Notes (Signed)
Pt stable and ambulatory for discharge, states understanding follow up.  

## 2018-06-14 NOTE — ED Triage Notes (Signed)
Pt reports having a small bump to anterior neck x3 months Pt reports pain to area, also feels some difficulty swallowing. Pt's breathing even and unlabored, no acute distress noted.

## 2018-06-14 NOTE — ED Provider Notes (Signed)
[facility] EMERGENCY DEPARTMENT Provider Note   CSN: [id number] Arrival date & time: 06/14/18  1755     History   Chief Complaint Chief Complaint  Patient presents with  . Abscess    HPI Steven Steele is a 57 y.o. male who presents with an inflamed cyst on his anterior neck. PMH significant for GBS, HTN. He states that he's had the cyst for several months on the front of his neck but Tuesday it started to become painful. He's never had this before. It feels like it is on the inside of his throat and hurts to swallow sometimes. Feels like a "fishbone" in his neck. He had to have a boil drained on his arm once.  HPI  Past Medical History:  Diagnosis Date  . Guillain-Barre (H[facility])   . Hypertension     Patient Active Problem List   Diagnosis Date Noted  . Angioedema 07/11/2016  . Allergic reaction 07/11/2016  . Elevated blood pressure reading 07/11/2016  . Perennial allergic rhinitis 07/11/2016  . GBS (Guillain Barre syndrome) (HCC)   . Paresthesias 08/29/2015  . Hand weakness 08/29/2015  . Paresthesia 08/29/2015    History reviewed. No pertinent surgical history.      Home Medications    Prior to Admission medications   Medication Sig Start Date End Date Taking? Authorizing Provider  diphenhydrAMINE (SOMINEX) 25 MG tablet Take 25 mg by mouth once.    [provider]  EPINEPHrine 0.3 mg/0.3 mL IJ SOAJ injection  06/06/16   [provider]  fluticasone (FLONASE) 50 MCG/ACT nasal spray Place 1 spray into both nostrils daily as needed for allergies or rhinitis.    [provider]  Multiple Vitamin (MULTIVITAMIN) tablet Take 1 tablet by mouth daily.    [provider]    Family History Family History  Problem Relation Age of Onset  . Cancer Mother        Breast  . Cancer Father        Lung  . Diabetes Paternal Uncle   . Healthy Brother   . Healthy Sister     Social History Social History   Tobacco Use    . Smoking status: Former Smoker    Types: Cigars  . Smokeless tobacco: Never Used  Substance Use Topics  . Alcohol use: No    Alcohol/week: 0.0 standard drinks  . Drug use: No     Allergies   Crab [shellfish allergy]   Review of Systems Review of Systems  Constitutional: Negative for fever.  Skin: Positive for color change.       +cyst     Physical Exam Updated Vital Signs BP (!) 164/105   Pulse 83   Temp 98.7 F (37.1 C) (Oral)   Resp 18   SpO2 100%   Physical Exam  Constitutional: He is oriented to person, place, and time. He appears well-developed and well-nourished. No distress.  HENT:  Head: Normocephalic and atraumatic.    3x3 cm inflamed cyst on the anterior neck  Eyes: Pupils are equal, round, and reactive to light. Conjunctivae are normal. Right eye exhibits no discharge. Left eye exhibits no discharge. No scleral icterus.  Neck: Normal range of motion.  Cardiovascular: Normal rate.  Pulmonary/Chest: Effort normal. No respiratory distress.  Abdominal: He exhibits no distension.  Neurological: He is alert and oriented to person, place, and time.  Skin: Skin is warm and dry.  Psychiatric: He has a normal mood and affect. His behavior is normal.  Nursing note and vitals reviewed.    ED Treatments / Results  Labs (all labs ordered are listed, but only abnormal results are displayed) Labs Reviewed - No data to display  EKG None  Radiology No results found.  Procedures .Marland KitchenIncision and Drainage Date/Time: 06/14/2018 7:34 PM Performed by: [name], PA-C Authorized by: [name], PA-C   Consent:    Consent obtained:  Verbal   Consent given by:  Patient   Risks discussed:  Incomplete drainage and pain   Alternatives discussed:  No treatment Location:    Indications for incision and drainage: epidermal cyst.   Size:  1x1   Location:  Neck   Neck location:  R anterior Pre-procedure details:    Skin preparation:   Betadine Anesthesia (see MAR for exact dosages):    Anesthesia method:  Local infiltration   Local anesthetic:  Lidocaine 2% WITH epi Procedure type:    Complexity:  Simple Procedure details:    Needle aspiration: no     Incision types:  Stab incision and single straight   Scalpel blade:  11   Wound management:  Probed and deloculated   Drainage:  Bloody   Drainage amount:  Scant   Packing materials:  None Post-procedure details:    Patient tolerance of procedure:  Tolerated well, no immediate complications Comments:     1 5-0 Ethilon stitch was placed   (including critical care time)    Medications Ordered in ED Medications - No data to display   Initial Impression / Assessment and Plan / ED Course  I have reviewed the triage vital signs and the nursing notes.  Pertinent labs & imaging results that were available during my care of the patient were reviewed by me and considered in my medical decision making (see chart for details).  57 year old male presents with epidermal cyst on the anterior neck. I&D was attempted. A small amount of drainage was expressed. Due to the location of the cyst, I did not feel comfortable attempting more extensive excision. He was advised to have stitch removed in one week and to follow up with dermatology for complete removal.  Final Clinical Impressions(s) / ED Diagnoses   Final diagnoses:  Epidermal cyst    ED Discharge Orders    None       [name], PA-C 06/14/18 1936    Donnetta Hutching, MD 06/15/18 [phone]

## 2018-06-14 NOTE — Discharge Instructions (Signed)
Please have stitch removed in 5 days. Keep area clean and dry Have total cyst removed by dermatologist Please return if you are worsening

## 2018-06-20 DIAGNOSIS — N401 Enlarged prostate with lower urinary tract symptoms: Secondary | ICD-10-CM | POA: Diagnosis not present

## 2018-06-20 DIAGNOSIS — R972 Elevated prostate specific antigen [PSA]: Secondary | ICD-10-CM | POA: Diagnosis not present

## 2018-06-20 DIAGNOSIS — R351 Nocturia: Secondary | ICD-10-CM | POA: Diagnosis not present

## 2018-07-16 DIAGNOSIS — G4733 Obstructive sleep apnea (adult) (pediatric): Secondary | ICD-10-CM | POA: Diagnosis not present

## 2018-09-19 DIAGNOSIS — R972 Elevated prostate specific antigen [PSA]: Secondary | ICD-10-CM | POA: Diagnosis not present

## 2018-12-06 DIAGNOSIS — N401 Enlarged prostate with lower urinary tract symptoms: Secondary | ICD-10-CM | POA: Diagnosis not present

## 2018-12-06 DIAGNOSIS — R351 Nocturia: Secondary | ICD-10-CM | POA: Diagnosis not present

## 2018-12-09 DIAGNOSIS — G4733 Obstructive sleep apnea (adult) (pediatric): Secondary | ICD-10-CM | POA: Diagnosis not present

## 2018-12-09 DIAGNOSIS — E785 Hyperlipidemia, unspecified: Secondary | ICD-10-CM | POA: Diagnosis not present

## 2018-12-09 DIAGNOSIS — G610 Guillain-Barre syndrome: Secondary | ICD-10-CM | POA: Diagnosis not present

## 2018-12-09 DIAGNOSIS — I10 Essential (primary) hypertension: Secondary | ICD-10-CM | POA: Diagnosis not present

## 2018-12-18 DIAGNOSIS — R351 Nocturia: Secondary | ICD-10-CM | POA: Diagnosis not present

## 2018-12-18 DIAGNOSIS — N401 Enlarged prostate with lower urinary tract symptoms: Secondary | ICD-10-CM | POA: Diagnosis not present

## 2018-12-18 DIAGNOSIS — R972 Elevated prostate specific antigen [PSA]: Secondary | ICD-10-CM | POA: Diagnosis not present

## 2019-03-03 DIAGNOSIS — G4733 Obstructive sleep apnea (adult) (pediatric): Secondary | ICD-10-CM | POA: Diagnosis not present

## 2019-04-14 DIAGNOSIS — G4733 Obstructive sleep apnea (adult) (pediatric): Secondary | ICD-10-CM | POA: Diagnosis not present

## 2019-05-09 DIAGNOSIS — M62830 Muscle spasm of back: Secondary | ICD-10-CM | POA: Diagnosis not present

## 2019-05-09 DIAGNOSIS — M9902 Segmental and somatic dysfunction of thoracic region: Secondary | ICD-10-CM | POA: Diagnosis not present

## 2019-05-09 DIAGNOSIS — M546 Pain in thoracic spine: Secondary | ICD-10-CM | POA: Diagnosis not present

## 2019-05-09 DIAGNOSIS — M9903 Segmental and somatic dysfunction of lumbar region: Secondary | ICD-10-CM | POA: Diagnosis not present

## 2019-05-10 DIAGNOSIS — M9903 Segmental and somatic dysfunction of lumbar region: Secondary | ICD-10-CM | POA: Diagnosis not present

## 2019-05-10 DIAGNOSIS — M62830 Muscle spasm of back: Secondary | ICD-10-CM | POA: Diagnosis not present

## 2019-05-10 DIAGNOSIS — M546 Pain in thoracic spine: Secondary | ICD-10-CM | POA: Diagnosis not present

## 2019-05-10 DIAGNOSIS — M9902 Segmental and somatic dysfunction of thoracic region: Secondary | ICD-10-CM | POA: Diagnosis not present

## 2019-05-13 DIAGNOSIS — M9902 Segmental and somatic dysfunction of thoracic region: Secondary | ICD-10-CM | POA: Diagnosis not present

## 2019-05-13 DIAGNOSIS — M9903 Segmental and somatic dysfunction of lumbar region: Secondary | ICD-10-CM | POA: Diagnosis not present

## 2019-05-13 DIAGNOSIS — M546 Pain in thoracic spine: Secondary | ICD-10-CM | POA: Diagnosis not present

## 2019-05-13 DIAGNOSIS — M62830 Muscle spasm of back: Secondary | ICD-10-CM | POA: Diagnosis not present

## 2019-05-15 DIAGNOSIS — M62830 Muscle spasm of back: Secondary | ICD-10-CM | POA: Diagnosis not present

## 2019-05-15 DIAGNOSIS — M9902 Segmental and somatic dysfunction of thoracic region: Secondary | ICD-10-CM | POA: Diagnosis not present

## 2019-05-15 DIAGNOSIS — M546 Pain in thoracic spine: Secondary | ICD-10-CM | POA: Diagnosis not present

## 2019-05-15 DIAGNOSIS — M9903 Segmental and somatic dysfunction of lumbar region: Secondary | ICD-10-CM | POA: Diagnosis not present

## 2019-05-29 DIAGNOSIS — M9903 Segmental and somatic dysfunction of lumbar region: Secondary | ICD-10-CM | POA: Diagnosis not present

## 2019-05-29 DIAGNOSIS — M62830 Muscle spasm of back: Secondary | ICD-10-CM | POA: Diagnosis not present

## 2019-05-29 DIAGNOSIS — M9902 Segmental and somatic dysfunction of thoracic region: Secondary | ICD-10-CM | POA: Diagnosis not present

## 2019-05-29 DIAGNOSIS — M546 Pain in thoracic spine: Secondary | ICD-10-CM | POA: Diagnosis not present

## 2019-06-13 DIAGNOSIS — E785 Hyperlipidemia, unspecified: Secondary | ICD-10-CM | POA: Diagnosis not present

## 2019-06-13 DIAGNOSIS — Z1159 Encounter for screening for other viral diseases: Secondary | ICD-10-CM | POA: Diagnosis not present

## 2019-06-13 DIAGNOSIS — Z0000 Encounter for general adult medical examination without abnormal findings: Secondary | ICD-10-CM | POA: Diagnosis not present

## 2019-06-13 DIAGNOSIS — Z23 Encounter for immunization: Secondary | ICD-10-CM | POA: Diagnosis not present

## 2019-06-13 DIAGNOSIS — Z125 Encounter for screening for malignant neoplasm of prostate: Secondary | ICD-10-CM | POA: Diagnosis not present

## 2019-06-18 DIAGNOSIS — N401 Enlarged prostate with lower urinary tract symptoms: Secondary | ICD-10-CM | POA: Diagnosis not present

## 2019-06-18 DIAGNOSIS — R351 Nocturia: Secondary | ICD-10-CM | POA: Diagnosis not present

## 2019-06-18 DIAGNOSIS — M222X2 Patellofemoral disorders, left knee: Secondary | ICD-10-CM | POA: Diagnosis not present

## 2019-12-02 DIAGNOSIS — R040 Epistaxis: Secondary | ICD-10-CM | POA: Diagnosis not present

## 2019-12-11 DIAGNOSIS — R040 Epistaxis: Secondary | ICD-10-CM | POA: Diagnosis not present

## 2019-12-16 DIAGNOSIS — I10 Essential (primary) hypertension: Secondary | ICD-10-CM | POA: Diagnosis not present

## 2019-12-16 DIAGNOSIS — Z9989 Dependence on other enabling machines and devices: Secondary | ICD-10-CM | POA: Diagnosis not present

## 2019-12-16 DIAGNOSIS — G4733 Obstructive sleep apnea (adult) (pediatric): Secondary | ICD-10-CM | POA: Diagnosis not present

## 2019-12-16 DIAGNOSIS — R040 Epistaxis: Secondary | ICD-10-CM | POA: Diagnosis not present

## 2019-12-16 DIAGNOSIS — H938X3 Other specified disorders of ear, bilateral: Secondary | ICD-10-CM | POA: Diagnosis not present

## 2020-01-09 DIAGNOSIS — R040 Epistaxis: Secondary | ICD-10-CM | POA: Diagnosis not present

## 2020-01-09 DIAGNOSIS — J3089 Other allergic rhinitis: Secondary | ICD-10-CM | POA: Diagnosis not present

## 2020-01-15 DIAGNOSIS — G4733 Obstructive sleep apnea (adult) (pediatric): Secondary | ICD-10-CM | POA: Diagnosis not present

## 2020-04-02 DIAGNOSIS — S63502A Unspecified sprain of left wrist, initial encounter: Secondary | ICD-10-CM | POA: Diagnosis not present

## 2020-04-29 DIAGNOSIS — G4733 Obstructive sleep apnea (adult) (pediatric): Secondary | ICD-10-CM | POA: Diagnosis not present

## 2020-07-30 DIAGNOSIS — R1010 Upper abdominal pain, unspecified: Secondary | ICD-10-CM | POA: Diagnosis not present

## 2020-07-30 DIAGNOSIS — Z0000 Encounter for general adult medical examination without abnormal findings: Secondary | ICD-10-CM | POA: Diagnosis not present

## 2020-07-30 DIAGNOSIS — Z125 Encounter for screening for malignant neoplasm of prostate: Secondary | ICD-10-CM | POA: Diagnosis not present

## 2020-07-30 DIAGNOSIS — E785 Hyperlipidemia, unspecified: Secondary | ICD-10-CM | POA: Diagnosis not present

## 2020-07-30 DIAGNOSIS — Z23 Encounter for immunization: Secondary | ICD-10-CM | POA: Diagnosis not present

## 2020-08-11 DIAGNOSIS — Z1283 Encounter for screening for malignant neoplasm of skin: Secondary | ICD-10-CM | POA: Diagnosis not present

## 2020-08-11 DIAGNOSIS — D225 Melanocytic nevi of trunk: Secondary | ICD-10-CM | POA: Diagnosis not present

## 2020-08-11 DIAGNOSIS — B351 Tinea unguium: Secondary | ICD-10-CM | POA: Diagnosis not present

## 2020-11-01 DIAGNOSIS — R351 Nocturia: Secondary | ICD-10-CM | POA: Diagnosis not present

## 2020-11-01 DIAGNOSIS — B351 Tinea unguium: Secondary | ICD-10-CM | POA: Diagnosis not present

## 2020-11-01 DIAGNOSIS — N401 Enlarged prostate with lower urinary tract symptoms: Secondary | ICD-10-CM | POA: Diagnosis not present

## 2020-12-05 ENCOUNTER — Other Ambulatory Visit: Payer: Self-pay

## 2020-12-05 ENCOUNTER — Emergency Department (HOSPITAL_BASED_OUTPATIENT_CLINIC_OR_DEPARTMENT_OTHER)
Admission: EM | Admit: 2020-12-05 | Discharge: 2020-12-05 | Disposition: A | Discharge status: Home / Self Care | Payer: Federal, State, Local not specified - PPO | Attending: Emergency Medicine | Admitting: Emergency Medicine

## 2020-12-05 ENCOUNTER — Encounter (HOSPITAL_BASED_OUTPATIENT_CLINIC_OR_DEPARTMENT_OTHER): Payer: Self-pay

## 2020-12-05 DIAGNOSIS — Z87891 Personal history of nicotine dependence: Secondary | ICD-10-CM | POA: Diagnosis not present

## 2020-12-05 DIAGNOSIS — I10 Essential (primary) hypertension: Secondary | ICD-10-CM | POA: Diagnosis not present

## 2020-12-05 DIAGNOSIS — S61213A Laceration without foreign body of left middle finger without damage to nail, initial encounter: Secondary | ICD-10-CM | POA: Diagnosis not present

## 2020-12-05 DIAGNOSIS — Y93G9 Activity, other involving cooking and grilling: Secondary | ICD-10-CM | POA: Diagnosis not present

## 2020-12-05 DIAGNOSIS — W260XXA Contact with knife, initial encounter: Secondary | ICD-10-CM | POA: Diagnosis not present

## 2020-12-05 NOTE — ED Triage Notes (Signed)
He has a lac. Of left index finger which he states occurred while he was cutting celery. No bleeding at present.

## 2020-12-05 NOTE — ED Provider Notes (Signed)
[facility] EMERGENCY DEPT Provider Note   CSN: [id number] Arrival date & time: 12/05/20  1025     History Chief Complaint  Patient presents with  . Extremity Laceration    Ely Ballen is a 60 y.o. male.  The history is provided by the patient.  Laceration Location:  Finger Finger laceration location:  L middle finger Length:  0.25 cm Depth:  Cutaneous Quality: jagged   Bleeding: controlled   Laceration mechanism:  Knife Pain details:    Quality:  Throbbing   Severity:  Moderate   Timing:  Constant   Progression:  Unchanged Foreign body present:  No foreign bodies Relieved by:  Nothing Worsened by:  Nothing Ineffective treatments:  None tried Tetanus status:  Up to date Associated symptoms: no fever, no focal weakness, no numbness, no rash and no swelling        Past Medical History:  Diagnosis Date  . Guillain-Barre (HCC)   . Hypertension     Patient Active Problem List   Diagnosis Date Noted  . Angioedema 07/11/2016  . Allergic reaction 07/11/2016  . Elevated blood pressure reading 07/11/2016  . Perennial allergic rhinitis 07/11/2016  . GBS (Guillain Barre syndrome) (HCC)   . Paresthesias 08/29/2015  . Hand weakness 08/29/2015  . Paresthesia 08/29/2015    No past surgical history on file.     Family History  Problem Relation Age of Onset  . Cancer Mother        Breast  . Cancer Father        Lung  . Diabetes Paternal Uncle   . Healthy Brother   . Healthy Sister     Social History   Tobacco Use  . Smoking status: Former Smoker    Types: Cigars  . Smokeless tobacco: Never Used  Substance Use Topics  . Alcohol use: No    Alcohol/week: 0.0 standard drinks  . Drug use: No    Home Medications Prior to Admission medications   Medication Sig Start Date End Date Taking? Authorizing Provider  diphenhydrAMINE (SOMINEX) 25 MG tablet Take 25 mg by mouth once.    [provider]  EPINEPHrine 0.3 mg/0.3 mL IJ SOAJ  injection  06/06/16   [provider]  fluticasone (FLONASE) 50 MCG/ACT nasal spray Place 1 spray into both nostrils daily as needed for allergies or rhinitis.    [provider]  Multiple Vitamin (MULTIVITAMIN) tablet Take 1 tablet by mouth daily.    [provider]    Allergies    Parke Simmers allergy]  Review of Systems   Review of Systems  Constitutional: Negative for chills and fever.  HENT: Negative for ear pain and sore throat.   Eyes: Negative for pain and visual disturbance.  Respiratory: Negative for cough and shortness of breath.   Cardiovascular: Negative for chest pain and palpitations.  Gastrointestinal: Negative for abdominal pain and vomiting.  Genitourinary: Negative for dysuria and hematuria.  Musculoskeletal: Negative for arthralgias and back pain.  Skin: Negative for color change and rash.  Neurological: Negative for focal weakness, seizures and syncope.  All other systems reviewed and are negative.   Physical Exam Updated Vital Signs BP (!) 141/84 (BP Location: Right Arm)   Pulse 74   Temp 99 F (37.2 C) (Oral)   Resp 16   SpO2 100%   Physical Exam Vitals and nursing note reviewed.  Constitutional:      Appearance: He is well-developed.  HENT:     Head: Normocephalic and atraumatic.  Eyes:     Conjunctiva/sclera: Conjunctivae normal.  Pulmonary:     Effort: Pulmonary effort is normal.  Musculoskeletal:     Cervical back: Normal range of motion.  Skin:    General: Skin is warm.     Comments: There is a tiny, jagged laceration on the dorsum of the distal phalanx of the left third finger at the ulnar aspect.  Bleeding is well controlled.  Sensation is normal.  Cap refill is normal.  Tendon function is normal.  Neurological:     Mental Status: He is alert and oriented to person, place, and time. Mental status is at baseline.  Psychiatric:        Mood and Affect: Mood normal.     ED Results / Procedures / Treatments    Labs (all labs ordered are listed, but only abnormal results are displayed) Labs Reviewed - No data to display  EKG None  Radiology No results found.  Procedures Procedures   Medications Ordered in ED Medications - No data to display  ED Course  I have reviewed the triage vital signs and the nursing notes.  Pertinent labs & imaging results that were available during my care of the patient were reviewed by me and considered in my medical decision making (see chart for details).    MDM Rules/Calculators/A&P                          Laceration appears to be amenable to conservative management and does not require sutures.  He was given instructions on wound care.  Return precautions were discussed.  No concern for foreign body.  No concern for tendon, vascular, or neurologic injury. Final Clinical Impression(s) / ED Diagnoses Final diagnoses:  Laceration of left middle finger without foreign body without damage to nail, initial encounter    Rx / DC Orders ED Discharge Orders    None       Koleen Distance, MD 12/05/20 1058

## 2020-12-05 NOTE — Discharge Instructions (Addendum)
Nexcare bandages that are clear work well for finger injuries.

## 2020-12-21 DIAGNOSIS — G4733 Obstructive sleep apnea (adult) (pediatric): Secondary | ICD-10-CM | POA: Diagnosis not present

## 2021-01-18 ENCOUNTER — Encounter (HOSPITAL_BASED_OUTPATIENT_CLINIC_OR_DEPARTMENT_OTHER): Payer: Self-pay | Admitting: *Deleted

## 2021-01-18 ENCOUNTER — Emergency Department (HOSPITAL_BASED_OUTPATIENT_CLINIC_OR_DEPARTMENT_OTHER): Payer: Federal, State, Local not specified - PPO | Admitting: Radiology

## 2021-01-18 ENCOUNTER — Emergency Department (HOSPITAL_BASED_OUTPATIENT_CLINIC_OR_DEPARTMENT_OTHER)
Admission: EM | Admit: 2021-01-18 | Discharge: 2021-01-18 | Disposition: A | Discharge status: Home / Self Care | Payer: Federal, State, Local not specified - PPO | Attending: Emergency Medicine | Admitting: Emergency Medicine

## 2021-01-18 ENCOUNTER — Other Ambulatory Visit: Payer: Self-pay

## 2021-01-18 DIAGNOSIS — R600 Localized edema: Secondary | ICD-10-CM | POA: Diagnosis not present

## 2021-01-18 DIAGNOSIS — I10 Essential (primary) hypertension: Secondary | ICD-10-CM | POA: Diagnosis not present

## 2021-01-18 DIAGNOSIS — R609 Edema, unspecified: Secondary | ICD-10-CM | POA: Insufficient documentation

## 2021-01-18 DIAGNOSIS — Z79899 Other long term (current) drug therapy: Secondary | ICD-10-CM | POA: Insufficient documentation

## 2021-01-18 DIAGNOSIS — F17290 Nicotine dependence, other tobacco product, uncomplicated: Secondary | ICD-10-CM | POA: Diagnosis not present

## 2021-01-18 DIAGNOSIS — M7989 Other specified soft tissue disorders: Secondary | ICD-10-CM | POA: Diagnosis not present

## 2021-01-18 DIAGNOSIS — R21 Rash and other nonspecific skin eruption: Secondary | ICD-10-CM | POA: Diagnosis not present

## 2021-01-18 LAB — CBC WITH DIFFERENTIAL/PLATELET
Abs Immature Granulocytes: 0.02 10*3/uL (ref 0.00–0.07)
Basophils Absolute: 0.1 10*3/uL (ref 0.0–0.1)
Basophils Relative: 1 %
Eosinophils Absolute: 0.7 10*3/uL — ABNORMAL HIGH (ref 0.0–0.5)
Eosinophils Relative: 9 %
HCT: 43.6 % (ref 39.0–52.0)
Hemoglobin: 14.8 g/dL (ref 13.0–17.0)
Immature Granulocytes: 0 %
Lymphocytes Relative: 40 %
Lymphs Abs: 3.3 10*3/uL (ref 0.7–4.0)
MCH: 29 pg (ref 26.0–34.0)
MCHC: 33.9 g/dL (ref 30.0–36.0)
MCV: 85.5 fL (ref 80.0–100.0)
Monocytes Absolute: 0.7 10*3/uL (ref 0.1–1.0)
Monocytes Relative: 9 %
Neutro Abs: 3.3 10*3/uL (ref 1.7–7.7)
Neutrophils Relative %: 41 %
Platelets: 245 10*3/uL (ref 150–400)
RBC: 5.1 MIL/uL (ref 4.22–5.81)
RDW: 12.5 % (ref 11.5–15.5)
WBC: 8.2 10*3/uL (ref 4.0–10.5)
nRBC: 0 % (ref 0.0–0.2)

## 2021-01-18 LAB — BASIC METABOLIC PANEL
Anion gap: 11 (ref 5–15)
BUN: 14 mg/dL (ref 6–20)
CO2: 25 mmol/L (ref 22–32)
Calcium: 9.7 mg/dL (ref 8.9–10.3)
Chloride: 105 mmol/L (ref 98–111)
Creatinine, Ser: 0.69 mg/dL (ref 0.61–1.24)
GFR, Estimated: >60 mL/min (ref >60)
Glucose, Bld: 120 mg/dL — ABNORMAL HIGH (ref 70–99)
Potassium: 3.5 mmol/L (ref 3.5–5.1)
Sodium: 141 mmol/L (ref 135–145)

## 2021-01-18 LAB — HEPATIC FUNCTION PANEL
ALT: 31 U/L (ref 0–44)
AST: 20 U/L (ref 15–41)
Albumin: 4.4 g/dL (ref 3.5–5.0)
Alkaline Phosphatase: 68 U/L (ref 38–126)
Bilirubin, Direct: 0.1 mg/dL (ref 0.0–0.2)
Indirect Bilirubin: 0.3 mg/dL (ref 0.3–0.9)
Total Bilirubin: 0.4 mg/dL (ref 0.3–1.2)
Total Protein: 6.9 g/dL (ref 6.5–8.1)

## 2021-01-18 LAB — BRAIN NATRIURETIC PEPTIDE: B Natriuretic Peptide: 10.1 pg/mL (ref 0.0–100.0)

## 2021-01-18 NOTE — Discharge Instructions (Addendum)
Suspect that swelling isfrom being on your feet.  Recommend compression socks.  Could be a side effect of your amlodipine.  If this is persistent follow-up with your primary care doctor to discuss stopping this medication.

## 2021-01-18 NOTE — ED Provider Notes (Signed)
[facility] EMERGENCY DEPT Provider Note   CSN: [id number] Arrival date & time: 01/18/21  1957     History Chief Complaint  Patient presents with  . Leg Swelling    Steven Steele is a 60 y.o. male.  The history is provided by the patient.  Illness Location:  Bilateral feet Quality:  Swelling Severity:  Mild Onset quality:  Gradual Progression:  Worsening Chronicity:  New Context:  Swelling in feet, walked a lot this past weekend playing golf. Relieved by:  Nothing Worsened by:  Nothing Associated symptoms: no abdominal pain, no chest pain, no congestion, no cough, no diarrhea, no ear pain, no fever, no rash, no shortness of breath, no sore throat and no vomiting        Past Medical History:  Diagnosis Date  . Guillain-Barre (HCC)   . Hypertension     Patient Active Problem List   Diagnosis Date Noted  . Angioedema 07/11/2016  . Allergic reaction 07/11/2016  . Elevated blood pressure reading 07/11/2016  . Perennial allergic rhinitis 07/11/2016  . GBS (Guillain Barre syndrome) (HCC)   . Paresthesias 08/29/2015  . Hand weakness 08/29/2015  . Paresthesia 08/29/2015    History reviewed. No pertinent surgical history.     Family History  Problem Relation Age of Onset  . Cancer Mother        Breast  . Cancer Father        Lung  . Diabetes Paternal Uncle   . Healthy Brother   . Healthy Sister     Social History   Tobacco Use  . Smoking status: Current Some Day Smoker    Types: Cigars  . Smokeless tobacco: Never Used  . Tobacco comment: 1 cigar a month  Vaping Use  . Vaping Use: Never used  Substance Use Topics  . Alcohol use: No    Alcohol/week: 0.0 standard drinks  . Drug use: No    Home Medications Prior to Admission medications   Medication Sig Start Date End Date Taking? Authorizing Provider  amLODipine (NORVASC) 10 MG tablet Take 1 tablet by mouth daily. 11/03/20  Yes [provider]  EPINEPHrine 0.3 mg/0.3 mL IJ  SOAJ injection  06/06/16  Yes [provider]  Multiple Vitamin (MULTIVITAMIN) tablet Take 1 tablet by mouth daily.   Yes [provider]  diphenhydrAMINE (SOMINEX) 25 MG tablet Take 25 mg by mouth once.    [provider]  fluticasone (FLONASE) 50 MCG/ACT nasal spray Place 1 spray into both nostrils daily as needed for allergies or rhinitis.    [provider]    Allergies    Parke Simmers allergy]  Review of Systems   Review of Systems  Constitutional: Negative for chills and fever.  HENT: Negative for congestion, ear pain and sore throat.   Eyes: Negative for pain and visual disturbance.  Respiratory: Negative for cough and shortness of breath.   Cardiovascular: Positive for leg swelling. Negative for chest pain and palpitations.  Gastrointestinal: Negative for abdominal pain, diarrhea and vomiting.  Genitourinary: Negative for dysuria and hematuria.  Musculoskeletal: Negative for arthralgias and back pain.  Skin: Negative for color change and rash.  Neurological: Negative for seizures and syncope.  All other systems reviewed and are negative.   Physical Exam Updated Vital Signs BP (!) 150/84 (BP Location: Right Arm)   Pulse 77   Temp 98.3 F (36.8 C) (Oral)   Resp 18   Ht 5\' 8"  (1.727 m)   Wt 99.8 kg  SpO2 97%   BMI 33.45 kg/m   Physical Exam Vitals and nursing note reviewed.  Constitutional:      General: He is not in acute distress.    Appearance: He is well-developed. He is not ill-appearing.  HENT:     Head: Normocephalic and atraumatic.  Eyes:     Extraocular Movements: Extraocular movements intact.     Conjunctiva/sclera: Conjunctivae normal.     Pupils: Pupils are equal, round, and reactive to light.  Cardiovascular:     Rate and Rhythm: Normal rate and regular rhythm.     Pulses: Normal pulses.     Heart sounds: No murmur heard.   Pulmonary:     Effort: Pulmonary effort is normal. No respiratory distress.      Breath sounds: Normal breath sounds.  Abdominal:     Palpations: Abdomen is soft.     Tenderness: There is no abdominal tenderness.  Musculoskeletal:        General: No tenderness.     Cervical back: Neck supple.     Comments: Trace swelling to bilateral feet from the ankle down  Skin:    General: Skin is warm and dry.  Neurological:     General: No focal deficit present.     Mental Status: He is alert.     ED Results / Procedures / Treatments   Labs (all labs ordered are listed, but only abnormal results are displayed) Labs Reviewed  CBC WITH DIFFERENTIAL/PLATELET - Abnormal; Notable for the following components:      Result Value   Eosinophils Absolute 0.7 (*)    All other components within normal limits  BASIC METABOLIC PANEL - Abnormal; Notable for the following components:   Glucose, Bld 120 (*)    All other components within normal limits  BRAIN NATRIURETIC PEPTIDE  HEPATIC FUNCTION PANEL    EKG None  Radiology DG Chest 2 View  Result Date: 01/18/2021 CLINICAL DATA:  61 year old male with concern for edema. EXAM: CHEST - 2 VIEW COMPARISON:  None. FINDINGS: The heart size and mediastinal contours are within normal limits. Both lungs are clear. The visualized skeletal structures are unremarkable. IMPRESSION: No active cardiopulmonary disease. Electronically Signed   By: Elgie Collard M.D.   On: 01/18/2021 21:07    Procedures Procedures   Medications Ordered in ED Medications - No data to display  ED Course  I have reviewed the triage vital signs and the nursing notes.  Pertinent labs & imaging results that were available during my care of the patient were reviewed by me and considered in my medical decision making (see chart for details).    MDM Rules/Calculators/A&P                          Steven Steele is here with feet swelling.  Normal vitals.  No fever.  No signs of respiratory distress.  Clear breath sounds.  Chest x-ray negative for volume  overload.  BNP is normal doubt heart failure.  No significant anemia, electrolyte adenopathy, kidney injury.  No liver dysfunction on lab work.  Overall suspect peripheral edema likely in the setting of being on his feet more often this past weekend playing golf.  No concern for DVT.  No concern for infectious process.  Neurovascularly he is intact on exam and no concern for arterial process.  Patient recommended to wear compression socks.  If swelling is persistent could be secondary to the fact that he is on amlodipine.  Overall given reassurance and discharged in ED in good condition.  This chart was dictated using voice recognition software.  Despite best efforts to proofread,  errors can occur which can change the documentation meaning.   Final Clinical Impression(s) / ED Diagnoses Final diagnoses:  Peripheral edema    Rx / DC Orders ED Discharge Orders    None       Virgina Norfolk, DO 01/18/21 2121

## 2021-01-18 NOTE — ED Triage Notes (Signed)
Pt states he played golf for the last 4 days in [place]. Today when he woke up both feet were swollen and painful. States has gotten worse throughout the day. Pt took a benadryl earlier today. Also reports redness on his face where it comes in contact with his cpap mask

## 2021-05-17 DIAGNOSIS — G4733 Obstructive sleep apnea (adult) (pediatric): Secondary | ICD-10-CM | POA: Diagnosis not present

## 2021-05-19 DIAGNOSIS — G4733 Obstructive sleep apnea (adult) (pediatric): Secondary | ICD-10-CM | POA: Diagnosis not present

## 2021-06-18 DIAGNOSIS — G4733 Obstructive sleep apnea (adult) (pediatric): Secondary | ICD-10-CM | POA: Diagnosis not present

## 2021-07-19 DIAGNOSIS — G4733 Obstructive sleep apnea (adult) (pediatric): Secondary | ICD-10-CM | POA: Diagnosis not present

## 2021-08-26 DIAGNOSIS — E785 Hyperlipidemia, unspecified: Secondary | ICD-10-CM | POA: Diagnosis not present

## 2021-08-26 DIAGNOSIS — I10 Essential (primary) hypertension: Secondary | ICD-10-CM | POA: Diagnosis not present

## 2021-08-26 DIAGNOSIS — Z125 Encounter for screening for malignant neoplasm of prostate: Secondary | ICD-10-CM | POA: Diagnosis not present

## 2021-08-26 DIAGNOSIS — Z0000 Encounter for general adult medical examination without abnormal findings: Secondary | ICD-10-CM | POA: Diagnosis not present

## 2021-08-26 DIAGNOSIS — R1010 Upper abdominal pain, unspecified: Secondary | ICD-10-CM | POA: Diagnosis not present

## 2021-08-26 DIAGNOSIS — Z23 Encounter for immunization: Secondary | ICD-10-CM | POA: Diagnosis not present

## 2021-09-08 DIAGNOSIS — M25511 Pain in right shoulder: Secondary | ICD-10-CM | POA: Diagnosis not present

## 2021-09-15 IMAGING — DX DG CHEST 2V
2 series · 2 of 2 positions shown · non-contrast
Comparison: None.

CLINICAL DATA: 60-year-old male with concern for edema.

EXAM:
CHEST - 2 VIEW

[chest pa]
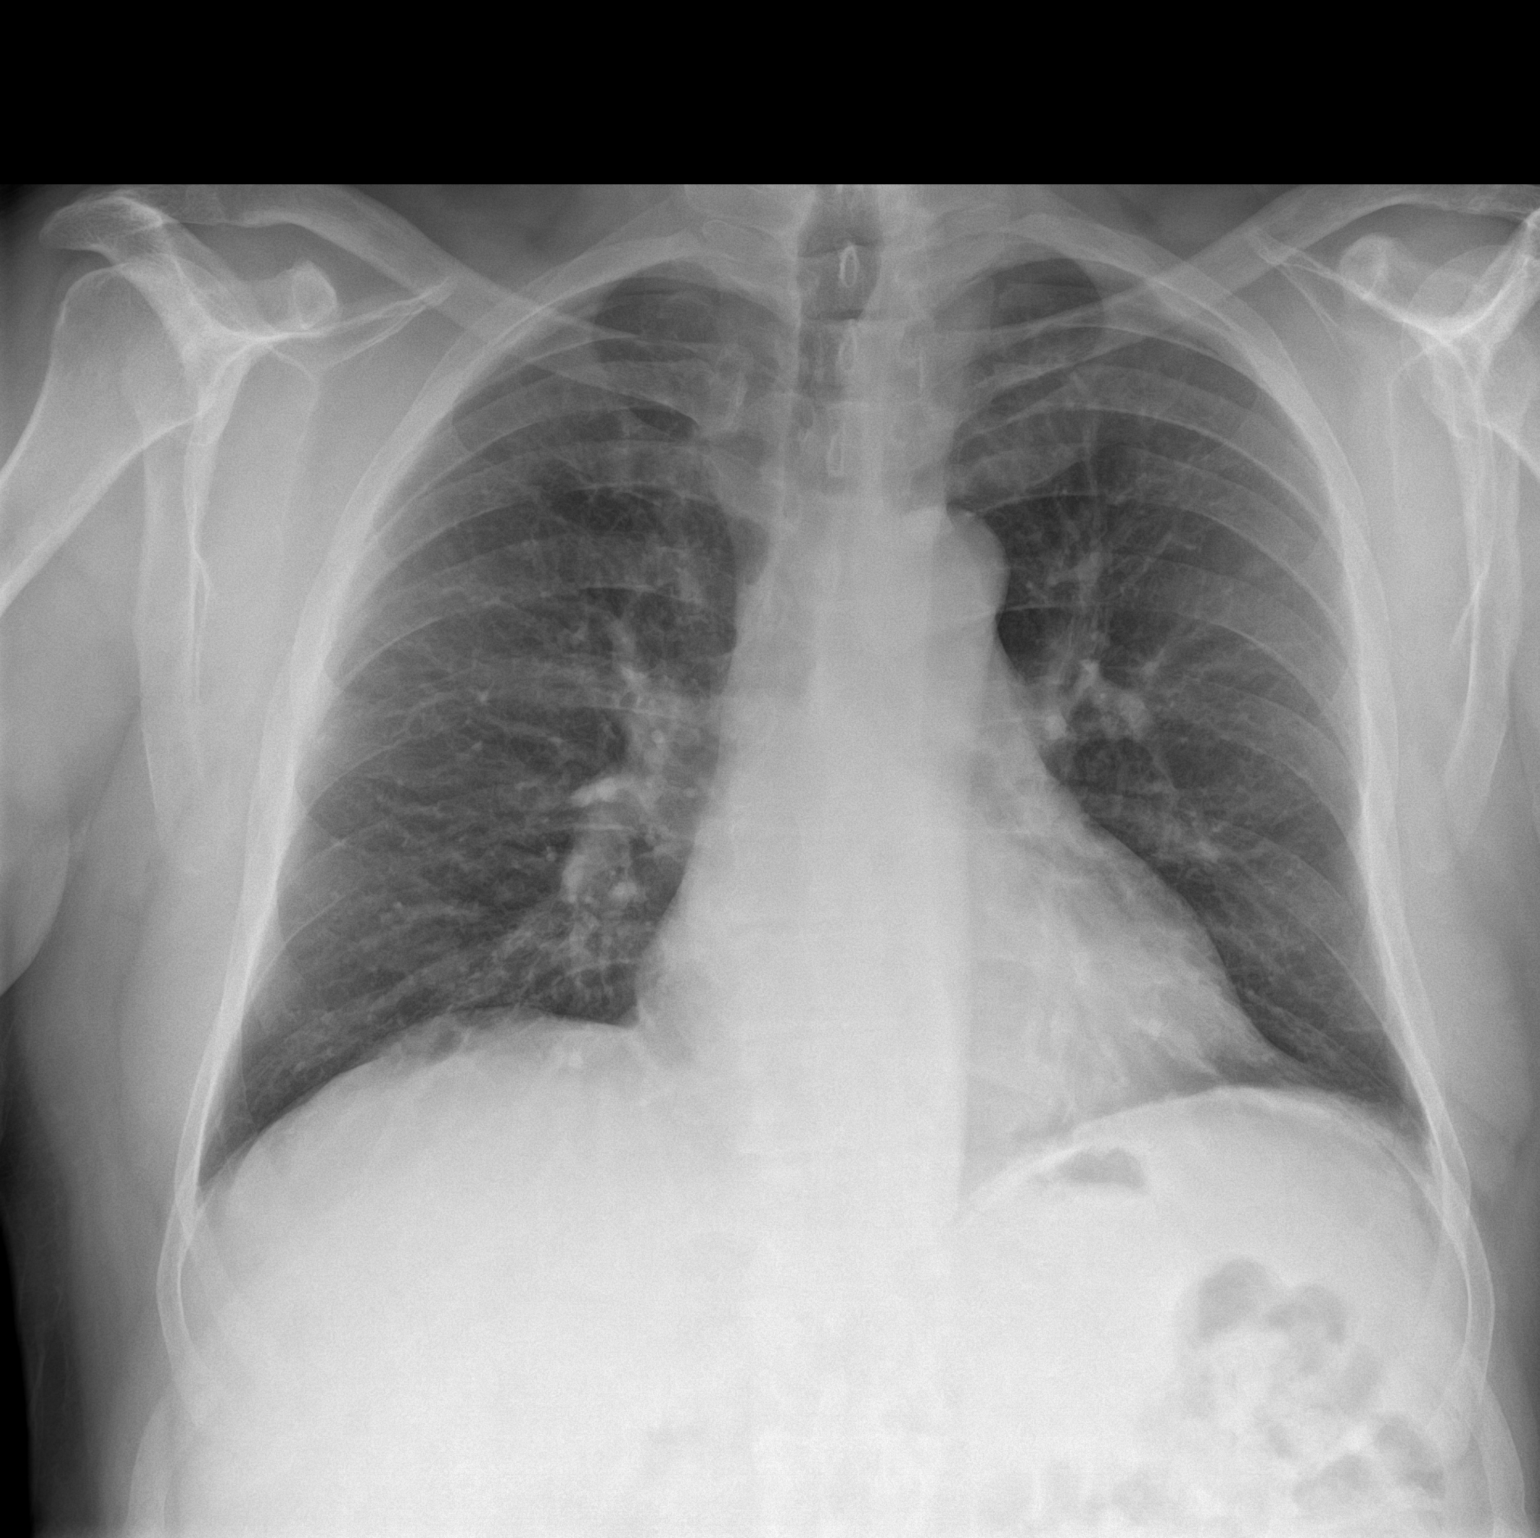

[chest lat]
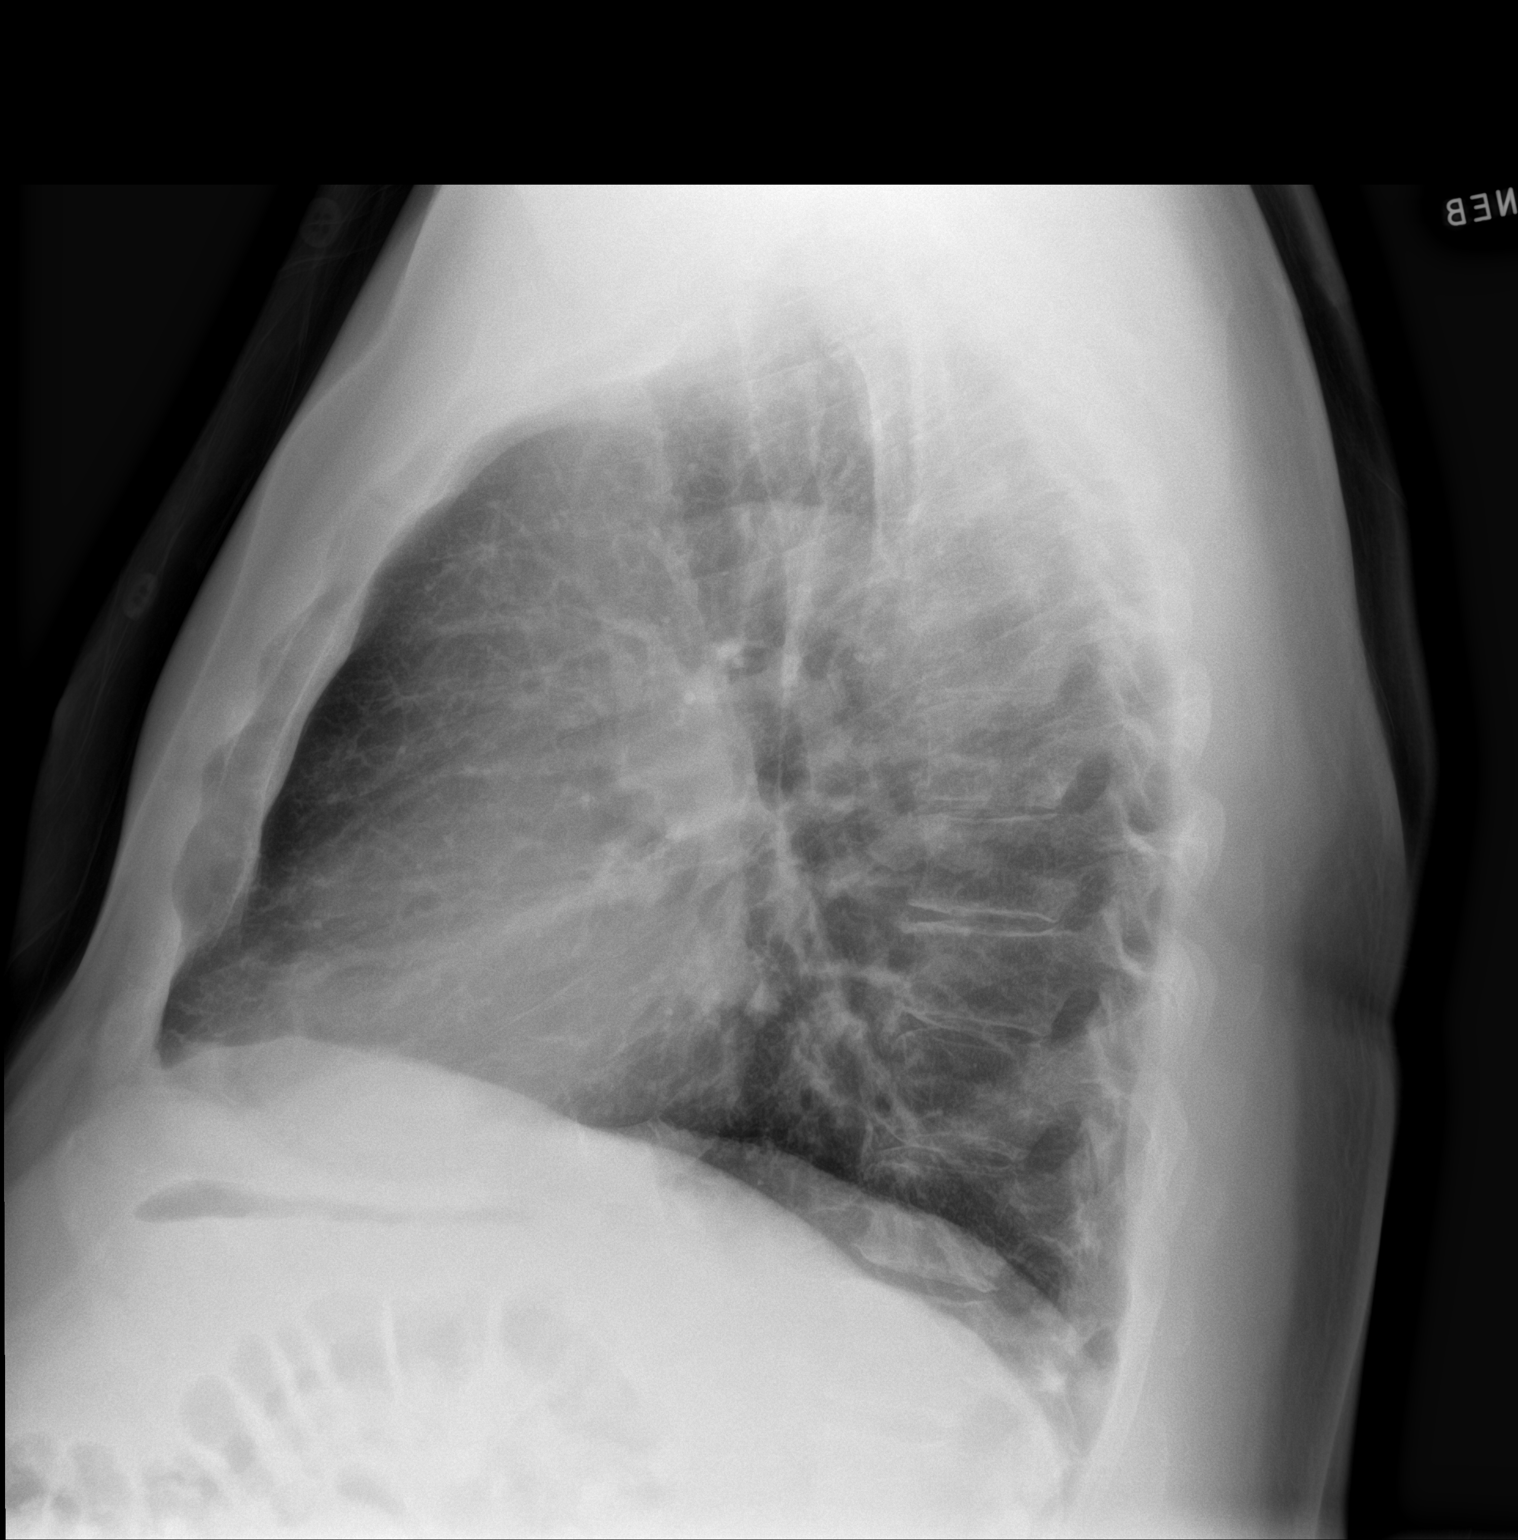

[2 of 2 positions shown; findings below may reference images not displayed]

FINDINGS: The heart size and mediastinal contours are within normal limits.
Both lungs are clear. The visualized skeletal structures are
unremarkable.
IMPRESSION: No active cardiopulmonary disease.

## 2021-11-08 DIAGNOSIS — G4733 Obstructive sleep apnea (adult) (pediatric): Secondary | ICD-10-CM | POA: Diagnosis not present

## 2021-11-23 DIAGNOSIS — E785 Hyperlipidemia, unspecified: Secondary | ICD-10-CM | POA: Diagnosis not present

## 2022-01-21 ENCOUNTER — Encounter (HOSPITAL_BASED_OUTPATIENT_CLINIC_OR_DEPARTMENT_OTHER): Payer: Self-pay

## 2022-01-21 ENCOUNTER — Other Ambulatory Visit: Payer: Self-pay

## 2022-01-21 ENCOUNTER — Emergency Department (HOSPITAL_BASED_OUTPATIENT_CLINIC_OR_DEPARTMENT_OTHER)
Admission: EM | Admit: 2022-01-21 | Discharge: 2022-01-21 | Disposition: A | Discharge status: Home / Self Care | Payer: Federal, State, Local not specified - PPO | Attending: Emergency Medicine | Admitting: Emergency Medicine

## 2022-01-21 DIAGNOSIS — Z5321 Procedure and treatment not carried out due to patient leaving prior to being seen by health care provider: Secondary | ICD-10-CM | POA: Diagnosis not present

## 2022-01-21 DIAGNOSIS — W57XXXA Bitten or stung by nonvenomous insect and other nonvenomous arthropods, initial encounter: Secondary | ICD-10-CM | POA: Diagnosis not present

## 2022-01-21 DIAGNOSIS — T7840XA Allergy, unspecified, initial encounter: Secondary | ICD-10-CM | POA: Diagnosis not present

## 2022-01-21 NOTE — ED Triage Notes (Signed)
Pt states he pulled a tick out of his L forearm yesterday. Today pt has pain and swelling to the site. Pt also reports he was bitten by a spider on his back 3 weeks ago that he would like evaluated.

## 2022-01-21 NOTE — ED Notes (Signed)
Pt wife came to nurses station and asked about wait time for a provider. Sts "its just a tick bite I think we will just come back tomorrow", RN sts that provider will be in as soon as they can. Pt wife and pt exited ED ambulatory NAD.

## 2022-01-22 DIAGNOSIS — S50862A Insect bite (nonvenomous) of left forearm, initial encounter: Secondary | ICD-10-CM | POA: Diagnosis not present

## 2022-01-22 DIAGNOSIS — W57XXXA Bitten or stung by nonvenomous insect and other nonvenomous arthropods, initial encounter: Secondary | ICD-10-CM | POA: Diagnosis not present

## 2022-01-26 DIAGNOSIS — W57XXXA Bitten or stung by nonvenomous insect and other nonvenomous arthropods, initial encounter: Secondary | ICD-10-CM | POA: Diagnosis not present

## 2022-01-26 DIAGNOSIS — L309 Dermatitis, unspecified: Secondary | ICD-10-CM | POA: Diagnosis not present

## 2022-03-05 ENCOUNTER — Other Ambulatory Visit: Payer: Self-pay

## 2022-03-05 ENCOUNTER — Emergency Department (HOSPITAL_COMMUNITY): Payer: Federal, State, Local not specified - PPO

## 2022-03-05 ENCOUNTER — Emergency Department (HOSPITAL_COMMUNITY)
Admission: EM | Admit: 2022-03-05 | Discharge: 2022-03-05 | Disposition: A | Discharge status: Home / Self Care | Payer: Federal, State, Local not specified - PPO | Attending: Emergency Medicine | Admitting: Emergency Medicine

## 2022-03-05 ENCOUNTER — Encounter (HOSPITAL_COMMUNITY): Payer: Self-pay

## 2022-03-05 DIAGNOSIS — R42 Dizziness and giddiness: Secondary | ICD-10-CM | POA: Insufficient documentation

## 2022-03-05 DIAGNOSIS — Z79899 Other long term (current) drug therapy: Secondary | ICD-10-CM | POA: Diagnosis not present

## 2022-03-05 DIAGNOSIS — R209 Unspecified disturbances of skin sensation: Secondary | ICD-10-CM | POA: Diagnosis not present

## 2022-03-05 DIAGNOSIS — R109 Unspecified abdominal pain: Secondary | ICD-10-CM | POA: Diagnosis not present

## 2022-03-05 DIAGNOSIS — E278 Other specified disorders of adrenal gland: Secondary | ICD-10-CM | POA: Diagnosis not present

## 2022-03-05 DIAGNOSIS — R197 Diarrhea, unspecified: Secondary | ICD-10-CM | POA: Diagnosis not present

## 2022-03-05 DIAGNOSIS — R410 Disorientation, unspecified: Secondary | ICD-10-CM | POA: Insufficient documentation

## 2022-03-05 DIAGNOSIS — I10 Essential (primary) hypertension: Secondary | ICD-10-CM | POA: Diagnosis not present

## 2022-03-05 DIAGNOSIS — M793 Panniculitis, unspecified: Secondary | ICD-10-CM | POA: Diagnosis not present

## 2022-03-05 DIAGNOSIS — K5730 Diverticulosis of large intestine without perforation or abscess without bleeding: Secondary | ICD-10-CM | POA: Diagnosis not present

## 2022-03-05 DIAGNOSIS — R100 Acute abdomen: Secondary | ICD-10-CM | POA: Diagnosis not present

## 2022-03-05 DIAGNOSIS — N200 Calculus of kidney: Secondary | ICD-10-CM | POA: Diagnosis not present

## 2022-03-05 DIAGNOSIS — R1084 Generalized abdominal pain: Secondary | ICD-10-CM | POA: Diagnosis not present

## 2022-03-05 LAB — URINALYSIS, ROUTINE W REFLEX MICROSCOPIC
Bilirubin Urine: NEGATIVE
Glucose, UA: NEGATIVE mg/dL
Hgb urine dipstick: NEGATIVE
Ketones, ur: NEGATIVE mg/dL
Leukocytes,Ua: NEGATIVE
Nitrite: NEGATIVE
Protein, ur: NEGATIVE mg/dL
Specific Gravity, Urine: 1.018 (ref 1.005–1.030)
pH: 6 (ref 5.0–8.0)

## 2022-03-05 LAB — CBC WITH DIFFERENTIAL/PLATELET
Abs Immature Granulocytes: 0.02 10*3/uL (ref 0.00–0.07)
Basophils Absolute: 0 10*3/uL (ref 0.0–0.1)
Basophils Relative: 0 %
Eosinophils Absolute: 0.2 10*3/uL (ref 0.0–0.5)
Eosinophils Relative: 3 %
HCT: 43.1 % (ref 39.0–52.0)
Hemoglobin: 14.7 g/dL (ref 13.0–17.0)
Immature Granulocytes: 0 %
Lymphocytes Relative: 30 %
Lymphs Abs: 2.7 10*3/uL (ref 0.7–4.0)
MCH: 29 pg (ref 26.0–34.0)
MCHC: 34.1 g/dL (ref 30.0–36.0)
MCV: 85 fL (ref 80.0–100.0)
Monocytes Absolute: 0.5 10*3/uL (ref 0.1–1.0)
Monocytes Relative: 5 %
Neutro Abs: 5.4 10*3/uL (ref 1.7–7.7)
Neutrophils Relative %: 62 %
Platelets: 276 10*3/uL (ref 150–400)
RBC: 5.07 MIL/uL (ref 4.22–5.81)
RDW: 12.6 % (ref 11.5–15.5)
WBC: 8.8 10*3/uL (ref 4.0–10.5)
nRBC: 0 % (ref 0.0–0.2)

## 2022-03-05 LAB — COMPREHENSIVE METABOLIC PANEL
ALT: 27 U/L (ref 0–44)
AST: 28 U/L (ref 15–41)
Albumin: 4.1 g/dL (ref 3.5–5.0)
Alkaline Phosphatase: 71 U/L (ref 38–126)
Anion gap: 10 (ref 5–15)
BUN: 19 mg/dL (ref 6–20)
CO2: 21 mmol/L — ABNORMAL LOW (ref 22–32)
Calcium: 9.4 mg/dL (ref 8.9–10.3)
Chloride: 111 mmol/L (ref 98–111)
Creatinine, Ser: 0.78 mg/dL (ref 0.61–1.24)
GFR, Estimated: >60 mL/min (ref >60)
Glucose, Bld: 138 mg/dL — ABNORMAL HIGH (ref 70–99)
Potassium: 3.8 mmol/L (ref 3.5–5.1)
Sodium: 142 mmol/L (ref 135–145)
Total Bilirubin: 0.9 mg/dL (ref 0.3–1.2)
Total Protein: 7.1 g/dL (ref 6.5–8.1)

## 2022-03-05 LAB — LIPASE, BLOOD: Lipase: 51 U/L (ref 11–51)

## 2022-03-05 MED ORDER — SODIUM CHLORIDE 0.9 % IV BOLUS
1000.0000 mL | Freq: Once | INTRAVENOUS | Status: AC
  Administered 2022-03-05: 1000 mL via INTRAVENOUS

## 2022-03-05 MED ORDER — SODIUM CHLORIDE (PF) 0.9 % IJ SOLN
INTRAMUSCULAR | Status: AC
  Filled 2022-03-05: qty 50

## 2022-03-05 MED ORDER — ONDANSETRON HCL 4 MG/2ML IJ SOLN
4.0000 mg | Freq: Once | INTRAMUSCULAR | Status: AC
  Administered 2022-03-05: 4 mg via INTRAVENOUS
  Filled 2022-03-05: qty 2

## 2022-03-05 MED ORDER — IOHEXOL 300 MG/ML  SOLN
100.0000 mL | Freq: Once | INTRAMUSCULAR | Status: AC | PRN
  Administered 2022-03-05: 100 mL via INTRAVENOUS

## 2022-03-05 MED ORDER — MORPHINE SULFATE (PF) 4 MG/ML IV SOLN
4.0000 mg | Freq: Once | INTRAVENOUS | Status: DC
  Filled 2022-03-05: qty 1

## 2022-03-05 NOTE — ED Provider Notes (Signed)
[facility]-EMERGENCY DEPT Provider Note   CSN: [id number] Arrival date & time: 03/05/22  1903     History  Chief Complaint  Patient presents with   Abdominal Pain    Leeland Lovelady is a 61 y.o. male.  Patient presents chief complaint of diarrhea and abdominal pain and lightheadedness.  He says he ate at [employer] when he came home and started to have violent diarrhea.  This caused him to get lightheaded and clammy and dizzy.  Symptoms lasted for about an hour or so with multiple episodes of diarrhea nonbloody.  Denies any vomiting denies chest pain.  Complaining of tingling sensation to his hands and lightheadedness.       Home Medications Prior to Admission medications   Medication Sig Start Date End Date Taking? Authorizing Provider  amLODipine (NORVASC) 10 MG tablet Take 1 tablet by mouth daily. 11/03/20   [provider]  diphenhydrAMINE (SOMINEX) 25 MG tablet Take 25 mg by mouth once.    [provider]  EPINEPHrine 0.3 mg/0.3 mL IJ SOAJ injection  06/06/16   [provider]  fluticasone (FLONASE) 50 MCG/ACT nasal spray Place 1 spray into both nostrils daily as needed for allergies or rhinitis.    [provider]  Multiple Vitamin (MULTIVITAMIN) tablet Take 1 tablet by mouth daily.    [provider]      Allergies    Parke Simmers allergy]    Review of Systems   Review of Systems  Constitutional:  Negative for fever.  HENT:  Negative for ear pain and sore throat.   Eyes:  Negative for pain.  Respiratory:  Negative for cough.   Cardiovascular:  Negative for chest pain.  Gastrointestinal:  Positive for abdominal pain.  Genitourinary:  Negative for flank pain.  Musculoskeletal:  Negative for back pain.  Skin:  Negative for color change and rash.  Neurological:  Negative for syncope.  All other systems reviewed and are negative.   Physical Exam Updated Vital Signs BP (!) 126/99   Pulse 88    Temp 97.6 F (36.4 C) (Oral)   Resp 14   Ht 5\' 8"  (1.727 m)   Wt 99.8 kg   SpO2 92%   BMI 33.45 kg/m  Physical Exam Constitutional:      Appearance: He is well-developed.  HENT:     Head: Normocephalic.     Nose: Nose normal.  Eyes:     Extraocular Movements: Extraocular movements intact.  Cardiovascular:     Rate and Rhythm: Normal rate.  Pulmonary:     Effort: Pulmonary effort is normal.  Abdominal:     Comments: Mild mid abdominal tenderness.  No guarding or rebound.  Skin:    Coloration: Skin is not jaundiced.  Neurological:     General: No focal deficit present.     Mental Status: He is alert. Mental status is at baseline. He is disoriented.     Cranial Nerves: No cranial nerve deficit.     ED Results / Procedures / Treatments   Labs (all labs ordered are listed, but only abnormal results are displayed) Labs Reviewed  COMPREHENSIVE METABOLIC PANEL - Abnormal; Notable for the following components:      Result Value   CO2 21 (*)    Glucose, Bld 138 (*)    All other components within normal limits  LIPASE, BLOOD  CBC WITH DIFFERENTIAL/PLATELET  URINALYSIS, ROUTINE W REFLEX MICROSCOPIC    EKG EKG Interpretation  Date/Time:  Sunday March 05 2022 19:25:37 EDT Ventricular Rate:  85 PR Interval:  59 QRS Duration: 128 QT Interval:  381 QTC Calculation: 453 R Axis:   40 Text Interpretation: Sinus rhythm Short PR interval Probable left atrial enlargement IVCD, consider atypical RBBB Probable anteroseptal infarct, old Baseline wander in lead(s) III Confirmed by Norman Clay ([id number]) on 03/05/2022 7:29:01 PM  Radiology CT Abdomen Pelvis W Contrast  Result Date: 03/05/2022 CLINICAL DATA:  Acute abdominal pain.  Nausea and diarrhea. EXAM: CT ABDOMEN AND PELVIS WITH CONTRAST TECHNIQUE: Multidetector CT imaging of the abdomen and pelvis was performed using the standard protocol following bolus administration of intravenous contrast. RADIATION DOSE REDUCTION: This exam was  performed according to the departmental dose-optimization program which includes automated exposure control, adjustment of the mA and/or kV according to patient size and/or use of iterative reconstruction technique. CONTRAST:  OMNIPAQUE IOHEXOL 300 MG/ML  SOLN COMPARISON:  None Available. FINDINGS: Lower chest: Minor subsegmental atelectasis in the left greater than right lower lobe. No pleural effusion or confluent airspace disease. Hepatobiliary: Diffusely decreased hepatic density typical of steatosis. No focal hepatic abnormality. No capsular nodularity. The gallbladder is decompressed. No calcified gallstone or pericholecystic fat stranding. Pancreas: Mild fatty atrophy.  No ductal dilatation or inflammation. Spleen: Normal in size without focal abnormality. Adrenals/Urinary Tract: 2 cm left adrenal nodule with Hounsfield units of 42. Normal right adrenal gland. 4.2 cm cyst in the upper right kidney. This needs no further follow-up. There is a 4 mm nonobstructing stone in the mid upper right kidney. No hydronephrosis. Symmetric renal excretion on delayed phase imaging there is a 4 mm stone in the right aspect of the urinary bladder. No bladder wall thickening. Stomach/Bowel: Stomach is distended with ingested contents. No gastric wall thickening. Normal positioning of the duodenum. No small bowel obstruction or inflammation. There is fecalization of distal small bowel contents. Normal appendix. Multifocal colonic diverticulosis, no focal diverticulitis. Portions of the colon are nondistended and not well assessed for wall thickening, there is no pericolonic edema. Vascular/Lymphatic: Mild aortic and iliac atherosclerosis. No aneurysm. Patent portal, splenic, and mesenteric veins. There is edema in the central small bowel mesentery with multiple small lymph nodes. No bulky abdominopelvic adenopathy. Reproductive: Enlarged prostate gland spans 5.3 cm transverse. There is mass effect on the bladder base.  Other: No free air, free fluid, or intra-abdominal fluid collection. Musculoskeletal: Lower lumbar facet hypertrophy. There are no acute or suspicious osseous abnormalities. IMPRESSION: 1. Mesenteric edema in the central small bowel mesentery with multiple small lymph nodes, suggesting mesenteric panniculitis. 2. Nonobstructing right renal stone. A 4 mm stone in the right aspect of the urinary bladder, no right hydronephrosis or ureteral distension to this suggest recent passage of stone. 3. Colonic diverticulosis without focal diverticulitis. 4. Hepatic steatosis. 5. Left adrenal nodule measuring 2 cm is indeterminate. Recommend 1 year follow up adrenal washout CT. If stable for > 1 year, no further f/u imaging. JACR 2017 Aug; 14(8):1038-44, JCAT 2016 Mar-Apr; 40(2):194-200, Urol J 2006 Spring; 3(2):71-4. 6. Enlarged prostate gland causing mass effect on the bladder base. Aortic Atherosclerosis (ICD10-I70.0). Electronically Signed   By: Narda Rutherford M.D.   On: 03/05/2022 22:14    Procedures Procedures    Medications Ordered in ED Medications  morphine (PF) 4 MG/ML injection 4 mg (4 mg Intravenous Patient Refused/Not Given 03/05/22 1932)  ondansetron (ZOFRAN) injection 4 mg (4 mg Intravenous Given 03/05/22 1930)  sodium chloride 0.9 % bolus 1,000 mL (0 mLs Intravenous Stopped 03/05/22 2021)  iohexol (  OMNIPAQUE) 300 MG/ML solution 100 mL (100 mLs Intravenous Contrast Given 03/05/22 2135)  sodium chloride (PF) 0.9 % injection (  Given by Other 03/05/22 2154)    ED Course/ Medical Decision Making/ A&P                           Medical Decision Making Amount and/or Complexity of Data Reviewed Labs: ordered. Radiology: ordered.  Risk Prescription drug management.   History obtained from family at bedside.  Cardiac monitoring showing sinus rhythm.  Review of records shows visit January 21, 2022 for tick bite.  Work-up included labs CBC CMP which were all unremarkable.  Vital signs within  normal limits.  Patient given IV fluids and Zofran, declined morphine.  Subsequently feeling much better with no other symptoms now.  Currently asymptomatic.  CT abdominal pelvis pursued showing mesenteric panniculitis with no other findings.  Patient advised liquid diet for 2 days with gradual advancement.  Advise follow-up with the primary care doctor in 3 to 4 days, advising immediate return for worsening symptoms fevers pain or any additional concerns.        Final Clinical Impression(s) / ED Diagnoses Final diagnoses:  Panniculitis  Diarrhea, unspecified type    Rx / DC Orders ED Discharge Orders     None         Cheryll Cockayne, MD 03/05/22 2256

## 2022-03-05 NOTE — Discharge Instructions (Signed)
Follow a liquid diet for the next 2 days.  You may gently advance the diet to regular diet after 2 days.  Follow-up with your primary care doctor within the next 2 to 4 days.  Return back to the ER if you have fevers worsening symptoms or any additional concerns.

## 2022-03-05 NOTE — ED Triage Notes (Signed)
Abdominal described cramping with nausea and diarrhea. Symptoms starting around 4 pm this afternoon.

## 2022-03-15 DIAGNOSIS — W57XXXA Bitten or stung by nonvenomous insect and other nonvenomous arthropods, initial encounter: Secondary | ICD-10-CM | POA: Diagnosis not present

## 2022-03-15 DIAGNOSIS — R109 Unspecified abdominal pain: Secondary | ICD-10-CM | POA: Diagnosis not present

## 2022-03-15 DIAGNOSIS — M793 Panniculitis, unspecified: Secondary | ICD-10-CM | POA: Diagnosis not present

## 2022-04-13 DIAGNOSIS — R194 Change in bowel habit: Secondary | ICD-10-CM | POA: Diagnosis not present

## 2022-04-13 DIAGNOSIS — K654 Sclerosing mesenteritis: Secondary | ICD-10-CM | POA: Diagnosis not present

## 2022-04-18 DIAGNOSIS — G4733 Obstructive sleep apnea (adult) (pediatric): Secondary | ICD-10-CM | POA: Diagnosis not present

## 2022-05-17 DIAGNOSIS — T781XXD Other adverse food reactions, not elsewhere classified, subsequent encounter: Secondary | ICD-10-CM | POA: Diagnosis not present

## 2022-05-17 DIAGNOSIS — J3089 Other allergic rhinitis: Secondary | ICD-10-CM | POA: Diagnosis not present

## 2022-05-18 DIAGNOSIS — G4733 Obstructive sleep apnea (adult) (pediatric): Secondary | ICD-10-CM | POA: Diagnosis not present

## 2022-06-23 DIAGNOSIS — K5730 Diverticulosis of large intestine without perforation or abscess without bleeding: Secondary | ICD-10-CM | POA: Diagnosis not present

## 2022-06-23 DIAGNOSIS — K644 Residual hemorrhoidal skin tags: Secondary | ICD-10-CM | POA: Diagnosis not present

## 2022-06-23 DIAGNOSIS — D125 Benign neoplasm of sigmoid colon: Secondary | ICD-10-CM | POA: Diagnosis not present

## 2022-06-23 DIAGNOSIS — R194 Change in bowel habit: Secondary | ICD-10-CM | POA: Diagnosis not present

## 2022-06-23 DIAGNOSIS — K635 Polyp of colon: Secondary | ICD-10-CM | POA: Diagnosis not present

## 2022-06-23 DIAGNOSIS — K648 Other hemorrhoids: Secondary | ICD-10-CM | POA: Diagnosis not present

## 2022-06-23 DIAGNOSIS — D128 Benign neoplasm of rectum: Secondary | ICD-10-CM | POA: Diagnosis not present

## 2022-09-05 DIAGNOSIS — Z23 Encounter for immunization: Secondary | ICD-10-CM | POA: Diagnosis not present

## 2022-09-05 DIAGNOSIS — I10 Essential (primary) hypertension: Secondary | ICD-10-CM | POA: Diagnosis not present

## 2022-09-05 DIAGNOSIS — Z0000 Encounter for general adult medical examination without abnormal findings: Secondary | ICD-10-CM | POA: Diagnosis not present

## 2022-09-05 DIAGNOSIS — Z125 Encounter for screening for malignant neoplasm of prostate: Secondary | ICD-10-CM | POA: Diagnosis not present

## 2022-09-05 DIAGNOSIS — E785 Hyperlipidemia, unspecified: Secondary | ICD-10-CM | POA: Diagnosis not present

## 2022-11-27 DIAGNOSIS — G4733 Obstructive sleep apnea (adult) (pediatric): Secondary | ICD-10-CM | POA: Diagnosis not present

## 2022-12-04 ENCOUNTER — Emergency Department (HOSPITAL_BASED_OUTPATIENT_CLINIC_OR_DEPARTMENT_OTHER)
Admission: EM | Admit: 2022-12-04 | Discharge: 2022-12-04 | Disposition: A | Discharge status: Home / Self Care | Payer: Federal, State, Local not specified - PPO | Attending: Emergency Medicine | Admitting: Emergency Medicine

## 2022-12-04 ENCOUNTER — Other Ambulatory Visit: Payer: Self-pay

## 2022-12-04 DIAGNOSIS — I10 Essential (primary) hypertension: Secondary | ICD-10-CM | POA: Insufficient documentation

## 2022-12-04 DIAGNOSIS — R202 Paresthesia of skin: Secondary | ICD-10-CM | POA: Diagnosis not present

## 2022-12-04 DIAGNOSIS — Z79899 Other long term (current) drug therapy: Secondary | ICD-10-CM | POA: Insufficient documentation

## 2022-12-04 LAB — CBC WITH DIFFERENTIAL/PLATELET
Abs Immature Granulocytes: 0.02 10*3/uL (ref 0.00–0.07)
Basophils Absolute: 0 10*3/uL (ref 0.0–0.1)
Basophils Relative: 1 %
Eosinophils Absolute: 0.3 10*3/uL (ref 0.0–0.5)
Eosinophils Relative: 4 %
HCT: 42.7 % (ref 39.0–52.0)
Hemoglobin: 14.8 g/dL (ref 13.0–17.0)
Immature Granulocytes: 0 %
Lymphocytes Relative: 40 %
Lymphs Abs: 2.8 10*3/uL (ref 0.7–4.0)
MCH: 29.2 pg (ref 26.0–34.0)
MCHC: 34.7 g/dL (ref 30.0–36.0)
MCV: 84.2 fL (ref 80.0–100.0)
Monocytes Absolute: 0.5 10*3/uL (ref 0.1–1.0)
Monocytes Relative: 6 %
Neutro Abs: 3.4 10*3/uL (ref 1.7–7.7)
Neutrophils Relative %: 49 %
Platelets: 279 10*3/uL (ref 150–400)
RBC: 5.07 MIL/uL (ref 4.22–5.81)
RDW: 13 % (ref 11.5–15.5)
WBC: 7 10*3/uL (ref 4.0–10.5)
nRBC: 0 % (ref 0.0–0.2)

## 2022-12-04 LAB — COMPREHENSIVE METABOLIC PANEL
ALT: 21 U/L (ref 0–44)
AST: 17 U/L (ref 15–41)
Albumin: 4.8 g/dL (ref 3.5–5.0)
Alkaline Phosphatase: 80 U/L (ref 38–126)
Anion gap: 12 (ref 5–15)
BUN: 15 mg/dL (ref 6–20)
CO2: 23 mmol/L (ref 22–32)
Calcium: 9.9 mg/dL (ref 8.9–10.3)
Chloride: 105 mmol/L (ref 98–111)
Creatinine, Ser: 0.74 mg/dL (ref 0.61–1.24)
GFR, Estimated: >60 mL/min (ref >60)
Glucose, Bld: 85 mg/dL (ref 70–99)
Potassium: 3.5 mmol/L (ref 3.5–5.1)
Sodium: 140 mmol/L (ref 135–145)
Total Bilirubin: 0.6 mg/dL (ref 0.3–1.2)
Total Protein: 7.1 g/dL (ref 6.5–8.1)

## 2022-12-04 NOTE — Discharge Instructions (Addendum)
You are seen today in the emergency department for upper extremity tingling and numbness.  Your electrolytes are normal, there is no white count.  As we discussed we are holding off on imaging and LP at this time given early and lower suspicion for Guillain-Barr.  If your symptoms progress, worsen or if you have new symptoms such as difficulty swallowing, weakness in your legs you need to return to the ED for further evaluation.  Please call your neurologist tomorrow and see if they can get you into the office.  Information above for other neurology groups attached as well.

## 2022-12-04 NOTE — ED Triage Notes (Signed)
Patient arrives with complaints of bilateral hand tingling/pain x1 day. Patient states that he has a history of Guillain-Barre syndrome in 2017 and was concerned about symptoms returning again.  Rates pain a 3/10.

## 2022-12-04 NOTE — ED Provider Notes (Signed)
[place] EMERGENCY DEPARTMENT AT [facility] Provider Note   CSN: [id number] Arrival date & time: 12/04/22  1247     History  Chief Complaint  Patient presents with   Hand Pain   Tingling    Steven Steele is a 62 y.o. male.  HPI   Patient is a 62 year old man with medical sleep hypertension Guillain-Barr presenting to the emergency department due to paresthesias.  Started last night, was like tingling in the tips of his finger as well as numbness and some discomfort.  States he had similar symptoms in 2017 when he was diagnosed with Guillain-Barr, started initially with fingertips tingling and then progressed to difficulty swallowing and then 4 hours later he had temporary paralysis.  He has been fine since 2017, he states 6 months ago he was bit by attack but denies any prodromal symptoms although he does report he had "chills" few weeks ago.  The tingling in his upper extremities is constant, he is not having any difficulty chewing or swallowing, no lower extremity weakness or numbness.  No recent falls or injuries, no back pain, neck pain, headache, vision changes.  He does endorse a metal taste inside his mouth.  Home Medications Prior to Admission medications   Medication Sig Start Date End Date Taking? Authorizing Provider  amLODipine (NORVASC) 10 MG tablet Take 1 tablet by mouth daily. 11/03/20   [provider]  diphenhydrAMINE (SOMINEX) 25 MG tablet Take 25 mg by mouth once.    [provider]  EPINEPHrine 0.3 mg/0.3 mL IJ SOAJ injection  06/06/16   [provider]  fluticasone (FLONASE) 50 MCG/ACT nasal spray Place 1 spray into both nostrils daily as needed for allergies or rhinitis.    [provider]  Multiple Vitamin (MULTIVITAMIN) tablet Take 1 tablet by mouth daily.    [provider]      Allergies    Parke Simmers allergy] and Meat extract    Review of Systems   Review of Systems  Physical Exam Updated  Vital Signs BP (!) 153/84 (BP Location: Right Arm)   Pulse 72   Temp 98.1 F (36.7 C) (Oral)   Resp 20   Ht  (1.727 m)   Wt 99.8 kg   SpO2 97%   BMI 33.45 kg/m  Physical Exam Vitals and nursing note reviewed. Exam conducted with a chaperone present.  Constitutional:      Appearance: Normal appearance.  HENT:     Head: Normocephalic and atraumatic.  Eyes:     General: No scleral icterus.       Right eye: No discharge.        Left eye: No discharge.     Extraocular Movements: Extraocular movements intact.     Pupils: Pupils are equal, round, and reactive to light.  Cardiovascular:     Rate and Rhythm: Normal rate and regular rhythm.     Pulses: Normal pulses.     Heart sounds: Normal heart sounds. No murmur heard.    No friction rub. No gallop.  Pulmonary:     Effort: Pulmonary effort is normal. No respiratory distress.     Breath sounds: Normal breath sounds.  Abdominal:     General: Abdomen is flat. Bowel sounds are normal. There is no distension.     Palpations: Abdomen is soft.     Tenderness: There is no abdominal tenderness.  Skin:    General: Skin is warm and dry.     Coloration: Skin is not  jaundiced.  Neurological:     Mental Status: He is alert. Mental status is at baseline.     Sensory: Sensory deficit present.     Coordination: Coordination normal.     Comments: Cranial nerves II through XII are grossly intact.  Upper and lower extremity strength is symmetric bilaterally.  Normal finger-nose, no pronator drift.  Sensation slightly reduced to fingertips bilaterally of upper extremities     ED Results / Procedures / Treatments   Labs (all labs ordered are listed, but only abnormal results are displayed) Labs Reviewed  CBC WITH DIFFERENTIAL/PLATELET  COMPREHENSIVE METABOLIC PANEL    EKG None  Radiology No results found.  Procedures Procedures    Medications Ordered in ED Medications - No data to display  ED Course/ Medical Decision  Making/ A&P                             Medical Decision Making Amount and/or Complexity of Data Reviewed Labs: ordered.   Patient is a 62 year old male with medical history of Guillain-Barr and hypertension presenting to the emergency department due to upper extremity tingling and sensation deficit.  Differential includes paresthesia, no electrolyte derangement, cervical spine injury, GBS, radiculopathy.  Patient's wife is an independent however historian at bedside, I also reviewed external medical records including workup for Guillain-Barr in 2017.  Consulted with Dr. Otelia Limes on-call with neurology.  We discussed the presentation and how similar it was previously 2017, he states that bilateral upper extremity tingling has a very wide differential and Guillain-Barr is lower on the differential at this time.  He would recommend against LP and MRI at this point, would recommend really strict return precautions and if things progress to return to the ED.  Appreciate his consult.  Laboratory workup is negative for leukocytosis, anemia or electrolyte abnormality.  I reevaluated the patient, no change or progression of his symptoms.  Unclear etiology of the symptoms, we discussed the risk of doing an early LP or MRI would be missing GBS which has not yet declared itself so would not be evident on studies. Unclear etiology for the paresthesias, will have him follow-up closely with neurology in the outpatient setting with very strict return precautions.  Both he and his wife are in agreement with this plan.  Stable for discharge at this time.        Final Clinical Impression(s) / ED Diagnoses Final diagnoses:  Paresthesias    Rx / DC Orders ED Discharge Orders     None         Theron Arista, [place] 12/04/22 1751    Alvira Monday, MD 12/04/22 2226

## 2022-12-13 ENCOUNTER — Ambulatory Visit: Payer: Federal, State, Local not specified - PPO | Admitting: Neurology

## 2022-12-27 DIAGNOSIS — G4733 Obstructive sleep apnea (adult) (pediatric): Secondary | ICD-10-CM | POA: Diagnosis not present

## 2023-01-27 DIAGNOSIS — G4733 Obstructive sleep apnea (adult) (pediatric): Secondary | ICD-10-CM | POA: Diagnosis not present

## 2023-03-19 ENCOUNTER — Other Ambulatory Visit: Payer: Self-pay | Admitting: Urology

## 2023-03-19 DIAGNOSIS — R3912 Poor urinary stream: Secondary | ICD-10-CM | POA: Diagnosis not present

## 2023-03-19 DIAGNOSIS — R972 Elevated prostate specific antigen [PSA]: Secondary | ICD-10-CM

## 2023-03-19 DIAGNOSIS — N401 Enlarged prostate with lower urinary tract symptoms: Secondary | ICD-10-CM | POA: Diagnosis not present

## 2023-03-19 DIAGNOSIS — R350 Frequency of micturition: Secondary | ICD-10-CM | POA: Diagnosis not present

## 2023-04-15 ENCOUNTER — Ambulatory Visit
Admission: RE | Admit: 2023-04-15 | Discharge: 2023-04-15 | Disposition: A | Discharge status: Home / Self Care | Payer: Federal, State, Local not specified - PPO | Source: Ambulatory Visit | Attending: Urology | Admitting: Urology

## 2023-04-15 DIAGNOSIS — R972 Elevated prostate specific antigen [PSA]: Secondary | ICD-10-CM | POA: Diagnosis not present

## 2023-04-15 MED ORDER — GADOPICLENOL 0.5 MMOL/ML IV SOLN
10.0000 mL | Freq: Once | INTRAVENOUS | Status: AC | PRN
  Administered 2023-04-15: 10 mL via INTRAVENOUS

## 2023-05-10 DIAGNOSIS — G4733 Obstructive sleep apnea (adult) (pediatric): Secondary | ICD-10-CM | POA: Diagnosis not present

## 2023-05-16 DIAGNOSIS — G4733 Obstructive sleep apnea (adult) (pediatric): Secondary | ICD-10-CM | POA: Diagnosis not present

## 2023-05-23 DIAGNOSIS — R351 Nocturia: Secondary | ICD-10-CM | POA: Diagnosis not present

## 2023-05-23 DIAGNOSIS — R972 Elevated prostate specific antigen [PSA]: Secondary | ICD-10-CM | POA: Diagnosis not present

## 2023-05-23 DIAGNOSIS — R3915 Urgency of urination: Secondary | ICD-10-CM | POA: Diagnosis not present

## 2023-05-23 DIAGNOSIS — N401 Enlarged prostate with lower urinary tract symptoms: Secondary | ICD-10-CM | POA: Diagnosis not present

## 2023-06-19 ENCOUNTER — Other Ambulatory Visit: Payer: Self-pay | Admitting: Family Medicine

## 2023-06-19 DIAGNOSIS — E279 Disorder of adrenal gland, unspecified: Secondary | ICD-10-CM

## 2023-06-25 DIAGNOSIS — K649 Unspecified hemorrhoids: Secondary | ICD-10-CM | POA: Diagnosis not present

## 2023-06-25 DIAGNOSIS — R519 Headache, unspecified: Secondary | ICD-10-CM | POA: Diagnosis not present

## 2023-06-25 DIAGNOSIS — E278 Other specified disorders of adrenal gland: Secondary | ICD-10-CM | POA: Diagnosis not present

## 2023-06-27 ENCOUNTER — Ambulatory Visit
Admission: RE | Admit: 2023-06-27 | Discharge: 2023-06-27 | Disposition: A | Discharge status: Home / Self Care | Payer: Federal, State, Local not specified - PPO | Source: Ambulatory Visit | Attending: Family Medicine | Admitting: Family Medicine

## 2023-06-27 DIAGNOSIS — D3502 Benign neoplasm of left adrenal gland: Secondary | ICD-10-CM | POA: Diagnosis not present

## 2023-06-27 DIAGNOSIS — E279 Disorder of adrenal gland, unspecified: Secondary | ICD-10-CM

## 2023-06-27 DIAGNOSIS — N132 Hydronephrosis with renal and ureteral calculous obstruction: Secondary | ICD-10-CM | POA: Diagnosis not present

## 2023-06-27 MED ORDER — IOPAMIDOL (ISOVUE-300) INJECTION 61%
100.0000 mL | Freq: Once | INTRAVENOUS | Status: AC | PRN
  Administered 2023-06-27: 100 mL via INTRAVENOUS

## 2023-07-03 DIAGNOSIS — R3912 Poor urinary stream: Secondary | ICD-10-CM | POA: Diagnosis not present

## 2023-07-03 DIAGNOSIS — R351 Nocturia: Secondary | ICD-10-CM | POA: Diagnosis not present

## 2023-07-03 DIAGNOSIS — R350 Frequency of micturition: Secondary | ICD-10-CM | POA: Diagnosis not present

## 2023-07-03 DIAGNOSIS — R3915 Urgency of urination: Secondary | ICD-10-CM | POA: Diagnosis not present

## 2023-07-07 DIAGNOSIS — J019 Acute sinusitis, unspecified: Secondary | ICD-10-CM | POA: Diagnosis not present

## 2023-07-12 DIAGNOSIS — N201 Calculus of ureter: Secondary | ICD-10-CM | POA: Diagnosis not present

## 2023-07-12 DIAGNOSIS — R351 Nocturia: Secondary | ICD-10-CM | POA: Diagnosis not present

## 2023-07-12 DIAGNOSIS — N401 Enlarged prostate with lower urinary tract symptoms: Secondary | ICD-10-CM | POA: Diagnosis not present

## 2023-07-12 DIAGNOSIS — R350 Frequency of micturition: Secondary | ICD-10-CM | POA: Diagnosis not present

## 2023-07-27 DIAGNOSIS — N401 Enlarged prostate with lower urinary tract symptoms: Secondary | ICD-10-CM | POA: Diagnosis not present

## 2023-07-27 DIAGNOSIS — R3914 Feeling of incomplete bladder emptying: Secondary | ICD-10-CM | POA: Diagnosis not present

## 2023-07-27 DIAGNOSIS — N201 Calculus of ureter: Secondary | ICD-10-CM | POA: Diagnosis not present

## 2023-07-31 ENCOUNTER — Other Ambulatory Visit: Payer: Self-pay | Admitting: Urology

## 2023-08-01 ENCOUNTER — Encounter (HOSPITAL_COMMUNITY): Payer: Self-pay

## 2023-08-02 DIAGNOSIS — G4733 Obstructive sleep apnea (adult) (pediatric): Secondary | ICD-10-CM | POA: Diagnosis not present

## 2023-08-05 NOTE — Progress Notes (Signed)
COVID Vaccine received:  []  No [x]  Yes Date of any COVID positive Test in last 90 days:  PCP - Farris Has, MD [phone] (Work)  [phone] (Fax)  Cardiologist - none  Chest x-ray - 01-18-2021   2v  Epic EKG - 03-02-2022 Epic   will repeat at PST  Stress Test -  ECHO -  Cardiac Cath -   PCR screen: []  Ordered & Completed []   No Order but Needs PROFEND     [x]   N/A for this surgery  Surgery Plan:  []  Ambulatory   [x]  Outpatient in bed  []  Admit Anesthesia:    [x]  General  []  Spinal  []   Choice []   MAC  Bowel Prep - [x]  No  []   Yes ______  Pacemaker / ICD device [x]  No []  Yes   Spinal Cord Stimulator:[x]  No []  Yes       History of Sleep Apnea? []  No [x]  Yes   CPAP used?- []  No [x]  Yes    Does the patient monitor blood sugar?   [x]  N/A   []  No []  Yes  Patient has: [x]  NO Hx DM   []  Pre-DM   []  DM1  []   DM2  Blood Thinner / Instructions: None Aspirin Instructions: none  ERAS Protocol Ordered: [x]  No  []  Yes Patient is to be NPO after: midnight prior  Dental hx: []  Dentures:  []  N/A      []  Bridge or Partial:                   []  Loose or Damaged teeth:   Comments:   Activity level: Patient is able / unable to climb a flight of stairs without difficulty; []  No CP  []  No SOB, but would have ___   Patient can / can not perform ADLs without assistance.   Anesthesia review: Guillain Gardiner Coins., HTN, OSA-CPAP, some days cigar smoker  Patient denies shortness of breath, fever, cough and chest pain at PAT appointment.  Patient verbalized understanding and agreement to the Pre-Surgical Instructions that were given to them at this PAT appointment. Patient was also educated of the need to review these PAT instructions again prior to his surgery.I reviewed the appropriate phone numbers to call if they have any and questions or concerns.

## 2023-08-05 NOTE — Patient Instructions (Signed)
SURGICAL WAITING ROOM VISITATION Patients having surgery or a procedure may have no more than 2 support people in the waiting area - these visitors may rotate in the visitor waiting room.   Due to an increase in RSV and influenza rates and associated hospitalizations, children ages 16 and under may not visit patients in [facility] hospitals. If the patient needs to stay at the hospital during part of their recovery, the visitor guidelines for inpatient rooms apply.  PRE-OP VISITATION  Pre-op nurse will coordinate an appropriate time for 1 support person to accompany the patient in pre-op.  This support person may not rotate.  This visitor will be contacted when the time is appropriate for the visitor to come back in the pre-op area.  Please refer to the [facility] website for the visitor guidelines for Inpatients (after your surgery is over and you are in a regular room).  You are not required to quarantine at this time prior to your surgery. However, you must do this: Hand Hygiene often Do NOT share personal items Notify your provider if you are in close contact with someone who has COVID or you develop fever 100.4 or greater, new onset of sneezing, cough, sore throat, shortness of breath or body aches.  If you test positive for Covid or have been in contact with anyone that has tested positive in the last 10 days please notify you surgeon.    Your procedure is scheduled on:  Friday  August 17, 2023  Report to [facility] Main Entrance: [address] entrance where the [employer] is available.   Report to admitting at:   12:00 noon  Call this number if you have any questions or problems the morning of surgery [phone]  DO NOT EAT OR DRINK ANYTHING AFTER MIDNIGHT THE NIGHT PRIOR TO YOUR SURGERY / PROCEDURE.   FOLLOW  ANY ADDITIONAL PRE OP INSTRUCTIONS YOU RECEIVED FROM YOUR SURGEON'S OFFICE!!!   Oral Hygiene is also important to reduce your risk of infection.         Remember - BRUSH YOUR TEETH THE MORNING OF SURGERY WITH YOUR REGULAR TOOTHPASTE  Do NOT smoke after Midnight the night before surgery.  STOP TAKING all Vitamins, Herbs and supplements 1 week before your surgery.   Take ONLY these medicines the morning of surgery with A SIP OF WATER:  amlodipine, finasteride, tamsulosin   If You have been diagnosed with Sleep Apnea - Bring CPAP mask and tubing day of surgery. We will provide you with a CPAP machine on the day of your surgery.                   You may not have any metal on your body including , jewelry, and body piercing  Do not wear  lotions, powders, cologne, or deodorant  Men may shave face and neck.  Contacts, Hearing Aids, dentures or bridgework may not be worn into surgery. DENTURES WILL BE REMOVED PRIOR TO SURGERY PLEASE DO NOT APPLY "Poly grip" OR ADHESIVES!!!  You may bring a small overnight bag with you on the day of surgery, only pack items that are not valuable. [facility] IS NOT RESPONSIBLE   FOR VALUABLES THAT ARE LOST OR STOLEN.   Do not bring your home medications to the hospital. The Pharmacy will dispense medications listed on your medication list to you during your admission in the Hospital.  Please read over the following fact sheets you were given: IF YOU HAVE QUESTIONS ABOUT YOUR  PRE-OP INSTRUCTIONS, PLEASE CALL [phone].   [place] - Preparing for Surgery Before surgery, you can play an important role.  Because skin is not sterile, your skin needs to be as free of germs as possible.  You can reduce the number of germs on your skin by washing with CHG (chlorahexidine gluconate) soap before surgery.  CHG is an antiseptic cleaner which kills germs and bonds with the skin to continue killing germs even after washing. Please DO NOT use if you have an allergy to CHG or antibacterial soaps.  If your skin becomes reddened/irritated stop using the CHG and inform your nurse when you arrive at Short Stay. Do not  shave (including legs and underarms) for at least 48 hours prior to the first CHG shower.  You may shave your face/neck.  Please follow these instructions carefully:  1.  Shower with CHG Soap the night before surgery and the  morning of surgery.  2.  If you choose to wash your hair, wash your hair first as usual with your normal  shampoo.  3.  After you shampoo, rinse your hair and body thoroughly to remove the shampoo.                             4.  Use CHG as you would any other liquid soap.  You can apply chg directly to the skin and wash.  Gently with a scrungie or clean washcloth.  5.  Apply the CHG Soap to your body ONLY FROM THE NECK DOWN.   Do not use on face/ open                           Wound or open sores. Avoid contact with eyes, ears mouth and genitals (private parts).                       Wash face,  Genitals (private parts) with your normal soap.             6.  Wash thoroughly, paying special attention to the area where your  surgery  will be performed.  7.  Thoroughly rinse your body with warm water from the neck down.  8.  DO NOT shower/wash with your normal soap after using and rinsing off the CHG Soap.            9.  Pat yourself dry with a clean towel.            10.  Wear clean pajamas.            11.  Place clean sheets on your bed the night of your first shower and do not  sleep with pets.  ON THE DAY OF SURGERY : Do not apply any lotions/deodorants the morning of surgery.  Please wear clean clothes to the hospital/surgery center.    FAILURE TO FOLLOW THESE INSTRUCTIONS MAY RESULT IN THE CANCELLATION OF YOUR SURGERY  PATIENT SIGNATURE_________________________________  NURSE SIGNATURE__________________________________  ________________________________________________________________________

## 2023-08-07 ENCOUNTER — Encounter (HOSPITAL_COMMUNITY): Payer: Self-pay

## 2023-08-07 ENCOUNTER — Encounter (HOSPITAL_COMMUNITY)
Admission: RE | Admit: 2023-08-07 | Discharge: 2023-08-07 | Disposition: A | Discharge status: Home / Self Care | Payer: Federal, State, Local not specified - PPO | Source: Ambulatory Visit | Attending: Urology | Admitting: Urology

## 2023-08-07 ENCOUNTER — Other Ambulatory Visit: Payer: Self-pay

## 2023-08-07 VITALS — BP 129/75 | Temp 98.7°F | Resp 16 | Ht 68.0 in | Wt 216.0 lb

## 2023-08-07 DIAGNOSIS — N189 Chronic kidney disease, unspecified: Secondary | ICD-10-CM | POA: Diagnosis not present

## 2023-08-07 DIAGNOSIS — I129 Hypertensive chronic kidney disease with stage 1 through stage 4 chronic kidney disease, or unspecified chronic kidney disease: Secondary | ICD-10-CM | POA: Diagnosis not present

## 2023-08-07 DIAGNOSIS — G4733 Obstructive sleep apnea (adult) (pediatric): Secondary | ICD-10-CM | POA: Diagnosis not present

## 2023-08-07 DIAGNOSIS — I10 Essential (primary) hypertension: Secondary | ICD-10-CM

## 2023-08-07 DIAGNOSIS — Z01812 Encounter for preprocedural laboratory examination: Secondary | ICD-10-CM | POA: Diagnosis not present

## 2023-08-07 DIAGNOSIS — F17200 Nicotine dependence, unspecified, uncomplicated: Secondary | ICD-10-CM | POA: Insufficient documentation

## 2023-08-07 DIAGNOSIS — K219 Gastro-esophageal reflux disease without esophagitis: Secondary | ICD-10-CM | POA: Diagnosis not present

## 2023-08-07 DIAGNOSIS — Z01818 Encounter for other preprocedural examination: Secondary | ICD-10-CM | POA: Insufficient documentation

## 2023-08-07 HISTORY — DX: Gastro-esophageal reflux disease without esophagitis: K21.9

## 2023-08-07 HISTORY — DX: Sleep apnea, unspecified: G47.30

## 2023-08-07 HISTORY — DX: Alcohol abuse, in remission: F10.11

## 2023-08-07 HISTORY — DX: Personal history of urinary calculi: Z87.442

## 2023-08-07 HISTORY — DX: Chronic kidney disease, unspecified: N18.9

## 2023-08-07 LAB — CBC
HCT: 41.8 % (ref 39.0–52.0)
Hemoglobin: 14.1 g/dL (ref 13.0–17.0)
MCH: 28.8 pg (ref 26.0–34.0)
MCHC: 33.7 g/dL (ref 30.0–36.0)
MCV: 85.5 fL (ref 80.0–100.0)
Platelets: 304 10*3/uL (ref 150–400)
RBC: 4.89 MIL/uL (ref 4.22–5.81)
RDW: 12.6 % (ref 11.5–15.5)
WBC: 7.3 10*3/uL (ref 4.0–10.5)
nRBC: 0 % (ref 0.0–0.2)

## 2023-08-07 LAB — BASIC METABOLIC PANEL
Anion gap: 9 (ref 5–15)
BUN: 16 mg/dL (ref 6–20)
CO2: 26 mmol/L (ref 22–32)
Calcium: 9.5 mg/dL (ref 8.9–10.3)
Chloride: 104 mmol/L (ref 98–111)
Creatinine, Ser: 0.89 mg/dL (ref 0.61–1.24)
GFR, Estimated: >60 mL/min (ref >60)
Glucose, Bld: 108 mg/dL — ABNORMAL HIGH (ref 70–99)
Potassium: 3.9 mmol/L (ref 3.5–5.1)
Sodium: 139 mmol/L (ref 135–145)

## 2023-08-08 NOTE — Progress Notes (Signed)
DISCUSSION: Steven Steele is a 62 yo male who presents to PAT prior to CYSTOSCOPY/RIGHT URETEROSCOPY/HOLMIUM LASER/STENT PLACEMENT AND RETROGRADE PYELOGRAM and TURP on 08/17/23 with Dr. Alvester Morin. PMH of some day smoking, HTN, OSA (uses CPAP), GBS in 2017, GERD, CKD.  Was admitted in 2017 for GBS, unclear etiology although presumed infectious. Treated with IVIG with improvement in his symptoms. Has not needed ongoing neurology f/u.  Follows with PCP, last seen on 06/25/23. All issues stable at that visit. He was sent for CT A/P on 06/27/23 for monitoring of an adrenal nodule and incidentally a 4mm ureteral stone was found with mild right hydronephrosis.  Has Alpha-gal allergy  VS: BP 129/75 Comment: Right arm sitting  Temp 37.1 C (Oral)   Resp 16   Ht 5\' 8"  (1.727 m)   Wt 98 kg   SpO2 99%   BMI 32.84 kg/m   PROVIDERS: Farris Has, MD   LABS: Labs reviewed: Acceptable for surgery. (all labs ordered are listed, but only abnormal results are displayed)  Labs Reviewed  BASIC METABOLIC PANEL - Abnormal; Notable for the following components:      Result Value   Glucose, Bld 108 (*)    All other components within normal limits  CBC     IMAGES:   EKG: Sinus bradycardia Left ventricular hypertrophy with QRS widening ( R in aVL , Cornell product ) Nonspecific ST and T wave abnormality Abnormal ECG When compared with ECG of 05-Mar-2022 19:25, HR is slower since previous ecg  CV:  Past Medical History:  Diagnosis Date   Chronic kidney disease    GERD (gastroesophageal reflux disease)    Guillain-Barre (HCC)    H/O ETOH abuse    sober x 37 years.   History of kidney stones    Hypertension    Sleep apnea    uses CPAP    Past Surgical History:  Procedure Laterality Date   NO PAST SURGERIES     TONSILLECTOMY      MEDICATIONS:  amLODipine (NORVASC) 10 MG tablet   cetirizine (ZYRTEC) 10 MG tablet   EPINEPHrine 0.3 mg/0.3 mL IJ SOAJ injection   finasteride (PROSCAR) 5  MG tablet   tamsulosin (FLOMAX) 0.4 MG CAPS capsule   No current facility-administered medications for this encounter.   Marcille Blanco [facility] Surgical Short Stay/Anesthesiology [facility] Phone [phone] 08/08/2023 10:44 AM

## 2023-08-08 NOTE — Anesthesia Preprocedure Evaluation (Addendum)
Anesthesia Evaluation  Patient identified by MRN, date of birth, ID band Patient awake    Reviewed: Allergy & Precautions, H&P , NPO status , Patient's Chart, lab work & pertinent test results  Airway Mallampati: III  TM Distance: >3 FB Neck ROM: Full    Dental  (+) Teeth Intact, Dental Advisory Given   Pulmonary sleep apnea and Continuous Positive Airway Pressure Ventilation , Current Smoker and Patient abstained from smoking. Occasional cigars   Pulmonary exam normal breath sounds clear to auscultation       Cardiovascular hypertension (156/86 preop, per pt normally slightly lower), Pt. on medications Normal cardiovascular exam Rhythm:Regular Rate:Normal     Neuro/Psych negative neurological ROS  negative psych ROS   GI/Hepatic Neg liver ROS,GERD  Controlled,,  Endo/Other  negative endocrine ROS    Renal/GU negative Renal ROS  negative genitourinary   Musculoskeletal negative musculoskeletal ROS (+)    Abdominal   Peds negative pediatric ROS (+)  Hematology negative hematology ROS (+) Hb 14   Anesthesia Other Findings   Reproductive/Obstetrics negative OB ROS                              Anesthesia Physical Anesthesia Plan  ASA: 3  Anesthesia Plan: General   Post-op Pain Management: Tylenol PO (pre-op)*   Induction: Intravenous  PONV Risk Score and Plan: 1 and Ondansetron, Dexamethasone, Midazolam and Treatment may vary due to age or medical condition  Airway Management Planned: LMA  Additional Equipment: None  Intra-op Plan:   Post-operative Plan: Extubation in OR  Informed Consent: I have reviewed the patients History and Physical, chart, labs and discussed the procedure including the risks, benefits and alternatives for the proposed anesthesia with the patient or authorized representative who has indicated his/her understanding and acceptance.     Dental advisory  given  Plan Discussed with: CRNA  Anesthesia Plan Comments: ( )         Anesthesia Quick Evaluation

## 2023-08-17 ENCOUNTER — Encounter (HOSPITAL_COMMUNITY)
Admission: RE | Disposition: A | Discharge status: Home / Self Care | Payer: Self-pay | Source: Home / Self Care | Attending: Urology

## 2023-08-17 ENCOUNTER — Other Ambulatory Visit (HOSPITAL_COMMUNITY): Payer: Self-pay

## 2023-08-17 ENCOUNTER — Ambulatory Visit (HOSPITAL_COMMUNITY): Payer: Federal, State, Local not specified - PPO

## 2023-08-17 ENCOUNTER — Other Ambulatory Visit: Payer: Self-pay

## 2023-08-17 ENCOUNTER — Ambulatory Visit (HOSPITAL_COMMUNITY): Payer: Federal, State, Local not specified - PPO | Admitting: Medical

## 2023-08-17 ENCOUNTER — Encounter (HOSPITAL_COMMUNITY): Payer: Self-pay | Admitting: Urology

## 2023-08-17 ENCOUNTER — Ambulatory Visit (HOSPITAL_COMMUNITY): Payer: Federal, State, Local not specified - PPO | Admitting: Certified Registered"

## 2023-08-17 ENCOUNTER — Observation Stay (HOSPITAL_COMMUNITY)
Admission: RE | Admit: 2023-08-17 | Discharge: 2023-08-18 | Disposition: A | Discharge status: Home / Self Care | Payer: Federal, State, Local not specified - PPO | Attending: Urology | Admitting: Urology

## 2023-08-17 DIAGNOSIS — R3914 Feeling of incomplete bladder emptying: Secondary | ICD-10-CM | POA: Insufficient documentation

## 2023-08-17 DIAGNOSIS — N201 Calculus of ureter: Secondary | ICD-10-CM | POA: Diagnosis not present

## 2023-08-17 DIAGNOSIS — N320 Bladder-neck obstruction: Secondary | ICD-10-CM | POA: Insufficient documentation

## 2023-08-17 DIAGNOSIS — N189 Chronic kidney disease, unspecified: Secondary | ICD-10-CM | POA: Diagnosis not present

## 2023-08-17 DIAGNOSIS — N400 Enlarged prostate without lower urinary tract symptoms: Principal | ICD-10-CM | POA: Diagnosis present

## 2023-08-17 DIAGNOSIS — N401 Enlarged prostate with lower urinary tract symptoms: Principal | ICD-10-CM | POA: Insufficient documentation

## 2023-08-17 DIAGNOSIS — I129 Hypertensive chronic kidney disease with stage 1 through stage 4 chronic kidney disease, or unspecified chronic kidney disease: Secondary | ICD-10-CM | POA: Diagnosis not present

## 2023-08-17 HISTORY — PX: CYSTOSCOPY/URETEROSCOPY/HOLMIUM LASER/STENT PLACEMENT: SHX6546

## 2023-08-17 HISTORY — PX: TRANSURETHRAL RESECTION OF PROSTATE: SHX73

## 2023-08-17 SURGERY — CYSTOSCOPY/URETEROSCOPY/HOLMIUM LASER/STENT PLACEMENT
Anesthesia: General | Site: Ureter | Laterality: Right

## 2023-08-17 MED ORDER — CEFAZOLIN SODIUM-DEXTROSE 2-4 GM/100ML-% IV SOLN
2.0000 g | INTRAVENOUS | Status: AC
  Administered 2023-08-17: 2 g via INTRAVENOUS
  Filled 2023-08-17: qty 100

## 2023-08-17 MED ORDER — LACTATED RINGERS IV SOLN: INTRAVENOUS | Status: DC

## 2023-08-17 MED ORDER — DEXAMETHASONE SODIUM PHOSPHATE 10 MG/ML IJ SOLN
INTRAMUSCULAR | Status: AC
Start: 2023-08-17 — End: ?
  Filled 2023-08-17: qty 1

## 2023-08-17 MED ORDER — ACETAMINOPHEN 325 MG PO TABS
650.0000 mg | ORAL_TABLET | ORAL | Status: DC | PRN
Start: 2023-08-17 — End: 2023-08-18
  Administered 2023-08-17 – 2023-08-18 (×2): 650 mg via ORAL
  Filled 2023-08-17 (×2): qty 2

## 2023-08-17 MED ORDER — MIDAZOLAM HCL 2 MG/2ML IJ SOLN
INTRAMUSCULAR | Status: AC
Start: 2023-08-17 — End: ?
  Filled 2023-08-17: qty 2

## 2023-08-17 MED ORDER — OXYBUTYNIN CHLORIDE 5 MG PO TABS
5.0000 mg | ORAL_TABLET | Freq: Three times a day (TID) | ORAL | Status: DC | PRN
  Administered 2023-08-18: 5 mg via ORAL
  Filled 2023-08-17 (×2): qty 1

## 2023-08-17 MED ORDER — HYDROMORPHONE HCL 1 MG/ML IJ SOLN
INTRAMUSCULAR | Status: AC
  Administered 2023-08-17: 0.5 mg via INTRAVENOUS
  Filled 2023-08-17: qty 1

## 2023-08-17 MED ORDER — SENNOSIDES-DOCUSATE SODIUM 8.6-50 MG PO TABS
2.0000 | ORAL_TABLET | Freq: Every day | ORAL | Status: DC
  Administered 2023-08-17: 2 via ORAL
  Filled 2023-08-17: qty 2

## 2023-08-17 MED ORDER — OXYCODONE HCL 5 MG/5ML PO SOLN: 5.0000 mg | Freq: Once | ORAL | Status: AC | PRN

## 2023-08-17 MED ORDER — OXYCODONE HCL 5 MG PO TABS
ORAL_TABLET | ORAL | Status: AC
  Administered 2023-08-17: 5 mg via ORAL
  Filled 2023-08-17: qty 1

## 2023-08-17 MED ORDER — HYDROCODONE-ACETAMINOPHEN 5-325 MG PO TABS
1.0000 | ORAL_TABLET | ORAL | 0 refills | Status: DC | PRN
  Filled 2023-08-17: qty 12, 2d supply, fill #0

## 2023-08-17 MED ORDER — LORATADINE 10 MG PO TABS
10.0000 mg | ORAL_TABLET | Freq: Every day | ORAL | Status: DC
  Administered 2023-08-18: 10 mg via ORAL
  Filled 2023-08-17: qty 1

## 2023-08-17 MED ORDER — AMISULPRIDE (ANTIEMETIC) 5 MG/2ML IV SOLN: 10.0000 mg | Freq: Once | INTRAVENOUS | Status: DC | PRN

## 2023-08-17 MED ORDER — ONDANSETRON HCL 4 MG/2ML IJ SOLN: 4.0000 mg | INTRAMUSCULAR | Status: DC | PRN

## 2023-08-17 MED ORDER — DIPHENHYDRAMINE HCL 12.5 MG/5ML PO ELIX: 12.5000 mg | ORAL_SOLUTION | Freq: Four times a day (QID) | ORAL | Status: DC | PRN

## 2023-08-17 MED ORDER — OXYCODONE HCL 5 MG PO TABS
5.0000 mg | ORAL_TABLET | ORAL | Status: DC | PRN
  Administered 2023-08-17 – 2023-08-18 (×6): 5 mg via ORAL
  Filled 2023-08-17 (×7): qty 1

## 2023-08-17 MED ORDER — TAMSULOSIN HCL 0.4 MG PO CAPS
0.4000 mg | ORAL_CAPSULE | Freq: Every day | ORAL | Status: DC
  Administered 2023-08-18: 0.4 mg via ORAL
  Filled 2023-08-17 (×2): qty 1

## 2023-08-17 MED ORDER — FENTANYL CITRATE (PF) 100 MCG/2ML IJ SOLN
INTRAMUSCULAR | Status: AC
  Filled 2023-08-17: qty 2

## 2023-08-17 MED ORDER — DEXMEDETOMIDINE HCL IN NACL 80 MCG/20ML IV SOLN
INTRAVENOUS | Status: DC | PRN
  Administered 2023-08-17: 12 ug via INTRAVENOUS
  Administered 2023-08-17: 8 ug via INTRAVENOUS

## 2023-08-17 MED ORDER — EPHEDRINE SULFATE (PRESSORS) 50 MG/ML IJ SOLN
INTRAMUSCULAR | Status: DC | PRN
  Administered 2023-08-17: 5 mg via INTRAVENOUS
  Administered 2023-08-17: 10 mg via INTRAVENOUS
  Administered 2023-08-17: 7.5 mg via INTRAVENOUS

## 2023-08-17 MED ORDER — FENTANYL CITRATE (PF) 100 MCG/2ML IJ SOLN
INTRAMUSCULAR | Status: DC | PRN
  Administered 2023-08-17 (×4): 50 ug via INTRAVENOUS

## 2023-08-17 MED ORDER — CEFAZOLIN SODIUM-DEXTROSE 2-4 GM/100ML-% IV SOLN
2.0000 g | Freq: Three times a day (TID) | INTRAVENOUS | Status: AC
  Administered 2023-08-17 – 2023-08-18 (×2): 2 g via INTRAVENOUS
  Filled 2023-08-17 (×2): qty 100

## 2023-08-17 MED ORDER — IOHEXOL 300 MG/ML  SOLN
INTRAMUSCULAR | Status: DC | PRN
  Administered 2023-08-17: 6 mL

## 2023-08-17 MED ORDER — LACTATED RINGERS IV SOLN
INTRAVENOUS | Status: DC | PRN
Start: 2023-08-17 — End: 2023-08-17

## 2023-08-17 MED ORDER — DIPHENHYDRAMINE HCL 50 MG/ML IJ SOLN: 12.5000 mg | Freq: Four times a day (QID) | INTRAMUSCULAR | Status: DC | PRN

## 2023-08-17 MED ORDER — MORPHINE SULFATE (PF) 2 MG/ML IV SOLN
2.0000 mg | INTRAVENOUS | Status: DC | PRN
  Administered 2023-08-17 – 2023-08-18 (×2): 2 mg via INTRAVENOUS
  Filled 2023-08-17: qty 1
  Filled 2023-08-17: qty 2

## 2023-08-17 MED ORDER — ONDANSETRON HCL 4 MG/2ML IJ SOLN: 4.0000 mg | Freq: Once | INTRAMUSCULAR | Status: DC | PRN

## 2023-08-17 MED ORDER — SODIUM CHLORIDE 0.9 % IR SOLN
Status: DC | PRN
  Administered 2023-08-17: 24000 mL

## 2023-08-17 MED ORDER — DEXAMETHASONE SODIUM PHOSPHATE 10 MG/ML IJ SOLN
INTRAMUSCULAR | Status: DC | PRN
  Administered 2023-08-17: 5 mg via INTRAVENOUS

## 2023-08-17 MED ORDER — CHLORHEXIDINE GLUCONATE 0.12 % MT SOLN
15.0000 mL | Freq: Once | OROMUCOSAL | Status: AC
  Administered 2023-08-17: 15 mL via OROMUCOSAL

## 2023-08-17 MED ORDER — PROPOFOL 1000 MG/100ML IV EMUL
INTRAVENOUS | Status: AC
  Filled 2023-08-17: qty 100

## 2023-08-17 MED ORDER — ZOLPIDEM TARTRATE 5 MG PO TABS
5.0000 mg | ORAL_TABLET | Freq: Every evening | ORAL | Status: DC | PRN
Start: 2023-08-17 — End: 2023-08-18

## 2023-08-17 MED ORDER — ACETAMINOPHEN 500 MG PO TABS
1000.0000 mg | ORAL_TABLET | Freq: Once | ORAL | Status: AC
  Administered 2023-08-17: 1000 mg via ORAL
  Filled 2023-08-17: qty 2

## 2023-08-17 MED ORDER — OXYCODONE HCL 5 MG PO TABS: 5.0000 mg | ORAL_TABLET | Freq: Once | ORAL | Status: AC | PRN

## 2023-08-17 MED ORDER — TRANEXAMIC ACID-NACL 1000-0.7 MG/100ML-% IV SOLN
INTRAVENOUS | Status: AC
  Filled 2023-08-17: qty 100

## 2023-08-17 MED ORDER — LIDOCAINE 2% (20 MG/ML) 5 ML SYRINGE
INTRAMUSCULAR | Status: DC | PRN
  Administered 2023-08-17: 80 mg via INTRAVENOUS

## 2023-08-17 MED ORDER — TRANEXAMIC ACID-NACL 1000-0.7 MG/100ML-% IV SOLN
INTRAVENOUS | Status: DC | PRN
  Administered 2023-08-17: 1000 mg via INTRAVENOUS

## 2023-08-17 MED ORDER — HYDROMORPHONE HCL 1 MG/ML IJ SOLN
0.2500 mg | INTRAMUSCULAR | Status: DC | PRN
  Administered 2023-08-17: 0.5 mg via INTRAVENOUS
  Administered 2023-08-17: 0.25 mg via INTRAVENOUS

## 2023-08-17 MED ORDER — AMLODIPINE BESYLATE 10 MG PO TABS
10.0000 mg | ORAL_TABLET | Freq: Every day | ORAL | Status: DC
  Administered 2023-08-18: 10 mg via ORAL
  Filled 2023-08-17 (×2): qty 1

## 2023-08-17 MED ORDER — PROPOFOL 10 MG/ML IV BOLUS
INTRAVENOUS | Status: DC | PRN
  Administered 2023-08-17: 300 mg via INTRAVENOUS
  Administered 2023-08-17: 200 mg via INTRAVENOUS

## 2023-08-17 MED ORDER — GLYCOPYRROLATE 0.2 MG/ML IJ SOLN
INTRAMUSCULAR | Status: DC | PRN
  Administered 2023-08-17: .2 mg via INTRAVENOUS

## 2023-08-17 MED ORDER — ORAL CARE MOUTH RINSE: 15.0000 mL | Freq: Once | OROMUCOSAL | Status: AC

## 2023-08-17 MED ORDER — ONDANSETRON HCL 4 MG/2ML IJ SOLN
INTRAMUSCULAR | Status: AC
  Filled 2023-08-17: qty 2

## 2023-08-17 MED ORDER — ONDANSETRON HCL 4 MG/2ML IJ SOLN
INTRAMUSCULAR | Status: DC | PRN
  Administered 2023-08-17: 4 mg via INTRAVENOUS

## 2023-08-17 MED ORDER — PHENYLEPHRINE HCL (PRESSORS) 10 MG/ML IV SOLN
INTRAVENOUS | Status: DC | PRN
  Administered 2023-08-17: 80 ug via INTRAVENOUS

## 2023-08-17 MED ORDER — TRIPLE ANTIBIOTIC 3.5-400-5000 EX OINT: 1.0000 | TOPICAL_OINTMENT | Freq: Three times a day (TID) | CUTANEOUS | Status: DC | PRN

## 2023-08-17 MED ORDER — DEXAMETHASONE SODIUM PHOSPHATE 10 MG/ML IJ SOLN
INTRAMUSCULAR | Status: AC
  Filled 2023-08-17: qty 1

## 2023-08-17 MED ORDER — SODIUM CHLORIDE 0.9 % IR SOLN
3000.0000 mL | Status: DC
  Administered 2023-08-17 – 2023-08-18 (×3): 3000 mL

## 2023-08-17 MED ORDER — PROPOFOL 10 MG/ML IV BOLUS
INTRAVENOUS | Status: AC
  Filled 2023-08-17: qty 20

## 2023-08-17 SURGICAL SUPPLY — 34 items
BAG URINE DRAIN 2000ML AR STRL (UROLOGICAL SUPPLIES) ×2 IMPLANT
BAG URO CATCHER STRL LF (MISCELLANEOUS) ×2 IMPLANT
BASKET LASER NITINOL 1.9FR (BASKET) IMPLANT
BASKET ZERO TIP NITINOL 2.4FR (BASKET) IMPLANT
CATH HEMA 3WAY 30CC 24FR COUDE (CATHETERS) IMPLANT
CATH URETERAL DUAL LUMEN 10F (MISCELLANEOUS) IMPLANT
CATH URETL OPEN END 6FR 70 (CATHETERS) ×2 IMPLANT
CATH URTH STD 24FR FL 3W 2 (CATHETERS) ×2 IMPLANT
CLOTH BEACON ORANGE TIMEOUT ST (SAFETY) ×2 IMPLANT
CUTTING ELECTRODE BIPO ×2
CUTTING ELECTRODE BIPO L#153162 - waiting on interface ×2
DRAPE FOOT SWITCH (DRAPES) ×2 IMPLANT
ELECTRODE CUT LOOP BIPOLAR 24- (ELECTROSURGICAL) IMPLANT
EXTRACTOR STONE 1.7FRX115CM (UROLOGICAL SUPPLIES) IMPLANT
GLOVE BIO SURGEON STRL SZ7.5 (GLOVE) ×2 IMPLANT
GOWN STRL REUS W/ TWL XL LVL3 (GOWN DISPOSABLE) ×2 IMPLANT
GUIDEWIRE ANG ZIPWIRE 038X150 (WIRE) IMPLANT
GUIDEWIRE STR DUAL SENSOR (WIRE) ×2 IMPLANT
HOLDER FOLEY CATH W/STRAP (MISCELLANEOUS) ×2 IMPLANT
KIT TURNOVER KIT A (KITS) IMPLANT
LASER FIB FLEXIVA PULSE ID 365 (Laser) IMPLANT
LOOP CUT BIPOLAR 24F LRG (ELECTROSURGICAL) ×2 IMPLANT
MANIFOLD NEPTUNE II (INSTRUMENTS) ×2 IMPLANT
PACK CYSTO (CUSTOM PROCEDURE TRAY) ×2 IMPLANT
PAD PREP 24X48 CUFFED NSTRL (MISCELLANEOUS) ×2 IMPLANT
SHEATH NAVIGATOR HD 11/13X28 (SHEATH) IMPLANT
SHEATH NAVIGATOR HD 11/13X36 (SHEATH) IMPLANT
STENT URET 6FRX26 CONTOUR (STENTS) IMPLANT
SYR 30ML LL (SYRINGE) ×2 IMPLANT
SYR TOOMEY IRRIG 70ML (MISCELLANEOUS) ×2
SYRINGE TOOMEY IRRIG 70ML (MISCELLANEOUS) ×2 IMPLANT
TRACTIP FLEXIVA PULS ID 200XHI (Laser) IMPLANT
TUBING CONNECTING 10 (TUBING) ×2 IMPLANT
TUBING UROLOGY SET (TUBING) ×2 IMPLANT

## 2023-08-17 NOTE — Anesthesia Procedure Notes (Signed)
Procedure Name: LMA Insertion Date/Time: 08/17/2023 1:14 PM  Performed by: Deri Fuelling, CRNAPre-anesthesia Checklist: Patient identified, Emergency Drugs available, Suction available and Patient being monitored Patient Re-evaluated:Patient Re-evaluated prior to induction Oxygen Delivery Method: Circle system utilized Preoxygenation: Pre-oxygenation with 100% oxygen Induction Type: IV induction Ventilation: Mask ventilation without difficulty Tube type: Oral Tube size: 4.0 mm Number of attempts: 1 Airway Equipment and Method: Stylet and Oral airway Placement Confirmation: ETT inserted through vocal cords under direct vision, positive ETCO2 and breath sounds checked- equal and bilateral Tube secured with: Tape Dental Injury: Teeth and Oropharynx as per pre-operative assessment

## 2023-08-17 NOTE — H&P (Signed)
CC/HPI: 03/19/2023  Patient underwent PSA screening on 09/09/2022 that showed a PSA of 4.25. He has had elevated PSA before that has come back within normal range. Never had a biopsy. Denies family history of prostate cancer. Has not had a recheck since January. Denies any hematuria or dysuria. Does have bothersome voiding complaints but no interest in treatment for these at this time.   05/23/2023  Repeat PSA was 5.15. He elected undergo MRI of the prostate. He elected against proceeding with a prostate biopsy. MRI showed a prostate size of 83 g without any high-grade lesions. He does complain about voiding symptoms including sensation of incomplete bladder emptying, frequency, intermittency, urgency, weak stream, straining to void, nocturia x 1. Not on any medication for his prostate. His brother had a TURP.   07/12/2023  Patient underwent a UroCuff that revealed the following:  UroCuff Summary  Patient's desire to void was 8 on a scale of 1 to 10. Patient voided 117 mL. The maximum flow (Qmax) was 5.4 mL/s and highest pressure at flow interruption (PcuffInt) was 198.9 cm H2O. Abdominal and perianal EMG was measured during the study. The calculated pressure-flow study (PFS) score was 25%. An automatic analysis algorithm calculated the pressure/flow point based on the inflations in the study. When plotted onto the modified nomogram this calculated pressure/flow point lies in the nomogram quadrant that suggests a finding of Obstructed.   In the interval, he also underwent a CT scan of his abdomen and pelvis on 06/27/2023 that showed a proximal right 4 mm ureteral calculus with mild right hydronephrosis. Urinalysis negative but he is having some intermittent dysuria and occasional flank pain. Pain is not severe. Is very mild.   Continues with significant voiding complaints including intermittency, urgency, weak stream, straining to void, nocturia x 3, sensation of incomplete bladder emptying. PVR was 260  after his UroCuff study.   07/27/2023: Mr. Ditommaso is a 62 year old man who presents today for follow-up of a right proximal ureteral stone measuring approximately 4 mm. He had sudden and severe pain 2 days ago but this subsided quickly. He has not seen a stone pass. His KUB shows persistent sided 4 mm opacity around the level of L2. Renal ultrasound shows persistent hydronephrosis. He also continues to have significant voiding complaints and would like to proceed with TURP at the same time is undergoing a ureteroscopy. He denies fevers, chills. He is only using Tylenol for pain. He remains on tamsulosin and finasteride.     ALLERGIES: Beef/Pork - alphagal Seafood - Anaphylaxis, can't breathe    MEDICATIONS: Finasteride 5 mg tablet 1 tablet PO Daily  Tamsulosin Hcl 0.4 mg capsule 1 capsule PO Daily  Amlodipine Besilate  Multivitamin     GU PSH: Complex cystometrogram, with voiding pressure studies, any technique - 07/03/2023 Complex Uroflow - 07/03/2023 Emg surf Electrd - 07/03/2023     NON-GU PSH: Visit Complexity (formerly GPC1X) - 07/12/2023, 05/23/2023, 03/19/2023     GU PMH: BPH w/LUTS - 07/12/2023, - 07/03/2023, - 05/23/2023, - 03/19/2023, - 2022 (Worsening), He does have BPH by exam. He has mild voiding symptoms., - 2019 Incomplete bladder emptying - 07/12/2023 Nocturia (Stable) - 07/12/2023, - 07/03/2023, - 05/23/2023 (Stable), - 03/19/2023, - 2022 (Improving), He has had improvement with both the Myrbetriq and the CPAP but doesn't want to continue on medication long-term if possible. I told him that that was fine with me and if he had further difficulties I would be happy to in follow-up. , - 2018, While  his nocturia area has improved somewhat with the use of a CPAP mask he continues to be bothered by both daytime and nighttime voiding symptoms., - 2018 Ureteral calculus - 07/12/2023 Urinary Frequency (Stable) - 07/12/2023, - 07/03/2023, - 05/23/2023, - 03/19/2023 Urinary Hesitancy -  07/12/2023 Weak Urinary Stream (Stable) - 07/12/2023, - 07/03/2023, - 05/23/2023, - 03/19/2023 Urinary Urgency - 07/03/2023, - 05/23/2023, - 03/19/2023 Elevated PSA - 05/23/2023, - 03/19/2023 (Improving), His prostate is benign and his PSA has remained at an acceptable level currently at 2.58. What I have recommended is continued annual DRE and PSA with an extra PSA being checked in 6 months from now to assure stability., - 2020, I noted no abnormality on his DRE. He understands the slight risk of continued monitoring of his PSA but has elected to have this repeated again in 3 months with abstinence prior to having his PSA drawn. He also understands that if his PSA continues to rise my recommendation is that we would then proceed with a prostate biopsy, - 2019 Overactive bladder, His symptoms are suggestive of bladder overactivity. He empties his bladder adequately and has clear urine so I am going to begin a trial of Myrbetriq 25 mg. I'll then have him return to reassess how this has worked for him in recheck his PVR. - 2018    NON-GU PMH: Tinea unguium - 2022 GERD Hypertension    FAMILY HISTORY: Cancer - Father, Mother   SOCIAL HISTORY: Marital Status: Married Preferred Language: English Current Smoking Status: Unknown if patient still smokes.   Tobacco Use Assessment Completed: Used Tobacco in last 30 days? Does not drink anymore.  Drinks 3 caffeinated drinks per day.    REVIEW OF SYSTEMS:    GU Review Male:   Patient reports frequent urination, hard to postpone urination, get up at night to urinate, and stream starts and stops. Patient denies burning/ pain with urination, leakage of urine, trouble starting your stream, have to strain to urinate , erection problems, and penile pain.  Gastrointestinal (Upper):   Patient denies nausea, vomiting, and indigestion/ heartburn.  Gastrointestinal (Lower):   Patient denies diarrhea and constipation.  Constitutional:   Patient denies fever, night  sweats, weight loss, and fatigue.  Skin:   Patient denies skin rash/ lesion and itching.  Eyes:   Patient denies blurred vision and double vision.  Musculoskeletal:   Patient denies joint pain and back pain.  Neurological:   Patient denies headaches and dizziness.  Psychologic:   Patient denies depression and anxiety.   VITAL SIGNS:      07/27/2023 01:11 PM  BP 151/90 mmHg  Pulse 73 /min  Temperature 97.5 F / 36.3 C   MULTI-SYSTEM PHYSICAL EXAMINATION:    Constitutional: Well-nourished. No physical deformities. Normally developed. Good grooming.  Respiratory: No labored breathing, no use of accessory muscles.   Cardiovascular: Normal temperature, normal extremity pulses, no swelling, no varicosities.  Skin: No paleness, no jaundice, no cyanosis. No lesion, no ulcer, no rash.  Neurologic / Psychiatric: Oriented to time, oriented to place, oriented to person. No depression, no anxiety, no agitation.  Gastrointestinal: No mass, no tenderness, no rigidity, non obese abdomen.     Complexity of Data:  Source Of History:  Patient  Records Review:   Previous Doctor Records, Previous Patient Records  Urine Test Review:   Urinalysis  X-Ray Review: KUB: Reviewed Films. Reviewed Report. Discussed With Patient.  Renal Ultrasound (Limited): Reviewed Films. Reviewed Report. Discussed With Patient.     05/23/23 03/19/23  11/01/20 06/18/19 12/06/18 09/19/18 06/06/18 01/02/17  PSA  Total PSA 4.52 ng/mL 5.15 ng/mL 3.83 ng/mL 2.57 ng/mL 2.58 ng/mL 2.54 ng/mL 3.74 ng/dl 4.74 ng/dl    25/95/63  Urinalysis  Urine Appearance Clear   Urine Color Yellow   Urine Glucose Neg mg/dL  Urine Bilirubin Neg mg/dL  Urine Ketones Neg mg/dL  Urine Specific Gravity 1.015   Urine Blood 3+ ery/uL  Urine pH 6.0   Urine Protein Trace mg/dL  Urine Urobilinogen 0.2 mg/dL  Urine Nitrites Neg   Urine Leukocyte Esterase Neg leu/uL  Urine WBC/hpf 0 - 5/hpf   Urine RBC/hpf 3 - 10/hpf   Urine Epithelial Cells NS (Not  Seen)   Urine Bacteria Few (10-25/hpf)   Urine Mucous Present   Urine Yeast NS (Not Seen)   Urine Trichomonas Not Present   Urine Cystals NS (Not Seen)   Urine Casts NS (Not Seen)   Urine Sperm Not Present    PROCEDURES:         KUB - 87564  A single view of the abdomen is obtained. KUB shows persistent right sided 4 mm opacity at the level of L2. There is a prominent overlying bowel gas pattern as well.      Patient confirmed No Neulasta OnPro Device.            Renal Ultrasound - 33295  Right Kidney: Length: 12.06 cm Depth: 5.77 cm Cortical Width: 1.63 cm Width: 6.06 cm  Left Kidney: Length: 12.12 cm Depth: 5.20 cm Cortical Width: 1.78 cm Width: 5.53 cm  Left Kidney/Ureter:  1)? Minimal Fullness---Refer to Cine Loop------2) Subcentimeter hyperechoic areas with and without shadowing - ? stones vs vascular calcifications   Right Kidney/Ureter:  1)Hydro with Dilated Renal Pelvis and Proximal Ureteral Stone measuring 0.80cm--Refer to Cine Loop------2) Upper Pole Cystic Area measuring 3.81cm x 4.14cm x 3.64cm------3)0.63cm and 0.75cm Mid Pole Cystic Areas----4) Subcentimeter hyperechoic areas with and without shadowing - ? stones vs vascular calcifications   Bladder:  PVR = 265.66ml----Right Ureteral Jet Visualized------Prostate indents into Bladder      Patient confirmed No Neulasta OnPro Device.           Urinalysis w/Scope Dipstick Dipstick Cont'd Micro  Color: Yellow Bilirubin: Neg mg/dL WBC/hpf: 0 - 5/hpf  Appearance: Clear Ketones: Neg mg/dL RBC/hpf: 3 - 18/ACZ  Specific Gravity: 1.015 Blood: 3+ ery/uL Bacteria: Few (10-25/hpf)  pH: 6.0 Protein: Trace mg/dL Cystals: NS (Not Seen)  Glucose: Neg mg/dL Urobilinogen: 0.2 mg/dL Casts: NS (Not Seen)    Nitrites: Neg Trichomonas: Not Present    Leukocyte Esterase: Neg leu/uL Mucous: Present      Epithelial Cells: NS (Not Seen)      Yeast: NS (Not Seen)      Sperm: Not Present    ASSESSMENT:      ICD-10 Details  1 GU:    BPH w/LUTS - N40.1 Chronic, Stable  2   Incomplete bladder emptying - R39.14 Chronic, Stable  3   Ureteral calculus - N20.1 Right, Acute, Uncomplicated   PLAN:           Schedule Return Visit/Planned Activity: Next Available Appointment - Schedule Surgery          Document Letter(s):  Created for Patient: Clinical Summary         Notes:   1. BPH with LUTS: He underwent UroCuff which showed that a bladder outlet obstruction procedure would be beneficial. Dr. Alvester Morin has recommended a TURP. I discussed at length with him the risks  of the procedure including the risks of anesthesia, the risks of injury to surrounding structures, the risk of worsening urinary symptoms and the risk of infection and bleeding. He verbalized his understanding to these risks. I also discussed expected postoperative recovery time and that for 6 weeks he should lift nothing heavier than a gallon of milk and avoid anything that would apply pressure to his saddle area including motorcycles, lawnmowers, long car rides.   2. Ureteral stone: Discussed with him the findings on today's imaging and that his ureteral stone remains in similar position. He would like to undergo ureteroscopy. Urinalysis will be sent for precautionary culture today. Stone intervention was discussed in detail today. For ureteroscopy, the patient understands that there is a chance for a staged procedure. Patient also understands that there is risk for bleeding, infection, injury to surrounding organs, and general risks of anesthesia. The patient also understands the placement of a stent and the risks of stent placement including, risk for infection, the risk for pain, and the risk for injury. For ESWL, the patient understands that there is a chance of failure of procedure, there is also a risk for bruising, infection, bleeding, and injury to surrounding structures. The patient verbalized understanding to these risks.     Signed by Bartholomew Crews, NP on  07/27/23 at 2:17 PM (EST)

## 2023-08-17 NOTE — Progress Notes (Signed)
   08/17/23 2222  BiPAP/CPAP/SIPAP  $ Non-Invasive Home Ventilator  Initial  BiPAP/CPAP/SIPAP Pt Type Adult  BiPAP/CPAP/SIPAP Resmed  Mask Type Full face mask (home mask and Tubing)  FiO2 (%) 21 %  Patient Home Equipment No (home mask and tubing)  Auto Titrate Yes (5-20 cmH2O)

## 2023-08-17 NOTE — Op Note (Signed)
Preoperative diagnosis: Bladder outlet obstruction secondary to BPH Right ureteral calculus  Postoperative diagnosis:  Bladder outlet obstruction secondary to BPH Right ureteral calculus  Procedure:  Cystoscopy with urethral dilation Transurethral resection of the prostate Right retrograde pyelogram Right ureteroscopy with laser lithotripsy and stone basketing and right ureteral stent placement  Surgeon: Ray Church, III. M.D.  Anesthesia: General  Complications: None  EBL: Minimal  Specimens: Renal calculi Prostate chips  Indication: Steven Steele is a patient with bladder outlet obstruction secondary to benign prostatic hyperplasia.  He also has a right ureteropelvic junction calculus.  After reviewing the management options for treatment, he elected to proceed with the above surgical procedure(s). We have discussed the potential benefits and risks of the procedure, side effects of the proposed treatment, the likelihood of the patient achieving the goals of the procedure, and any potential problems that might occur during the procedure or recuperation. Informed consent has been obtained.  Findings: 1.  Normal anterior urethra 2.  Prostate with trilobar hypertrophy with a moderate intravesical median lobe.  This was completely resected until there was a wide open channel.  Good hemostasis at the conclusion of the case. 3.  Bladder mucosa with mild trabeculation.  Tiny bladder calculi that washed out of the scope.  Bilateral ureteral orifices visualized and well away from TURP resection.  No obvious tumors. 4.  Right ureteroscopy confirmed an approximate 4 mm ureteropelvic junction calculus.  There was significant ureteral edema at the ureteropelvic junction.  I fragmented the stone basket extracted it.  5.  Right retrograde pyelogram revealed moderate hydronephrosis.  No filling defect after treatment of the stone.  Description of procedure:  The patient was taken to the  operating room and general anesthesia was induced.  The patient was placed in the dorsal lithotomy position, prepped and draped in the usual sterile fashion, and preoperative antibiotics were administered. A preoperative time-out was performed.   A 21 French rigid cystoscope was advanced into the urethra and into the bladder.  Complete cystoscopy was performed with findings noted above.  The right ureter was cannulated with a sensor wire which was advanced up to the kidney under fluoroscopic guidance.  A second wire was advanced alongside this and up into the kidney.  I withdrew the scope.  I advanced an 11 x 13 ureteral access sheath over one of the wires under continuous fluoroscopic guidance up to the proximal ureter.  Inner sheath and wire were withdrawn.  The stone had retropulsed into the kidney after wire placement.  I advanced a flexible ureteroscope into the kidney.  Stone was too large to basket extract because the ureteropelvic junction was a little bit tight from the ureteral edema.  I therefore fragmented the stone to smaller pieces followed by basket extraction of 4 separate pieces.  These were collected for specimen.  I started retrograde pyelogram through the scope with findings noted above.  I withdrew the scope along with the access sheath visualizing the ureter upon removal.  There was no ureteral injury and no ureteral calculi were seen.  I backloaded the wire onto rigid cystoscope and advanced that into the bladder followed by routine placement of a 6 x 26 double-J ureteral stent.  Fluoroscopy confirmed proximal placement and direct visualization confirmed a good coil within the bladder.  I drained the bladder and withdrew the scope.  The meatus was too tight for the resectoscope.  Therefore, I dilated with sounds sequentially from 22 [place] up to 30 [place].  The  26 French resectoscope with a visual obturator in place was then able to be advanced into the urethra and into the bladder.  The  ureteral orifices were identified and marked so as to be avoided during the procedure.  The prostate adenoma was then resected utilizing loop cautery resection with the bipolar cutting loop.  The prostate adenoma from the bladder neck back to the verumontanum was resected beginning at the six o'clock position and then extended to include the right and left lobes of the prostate and anterior prostate. Care was taken not to resect distal to the verumontanum.  Hemostasis was then achieved with the cautery and the bladder was emptied and reinspected with no significant bleeding noted at the end of the procedure.    A 24 French 3 way catheter was then placed into the bladder.  The patient appeared to tolerate the procedure well and without complications.  The patient was able to be awakened and transferred to the recovery unit in satisfactory condition.

## 2023-08-17 NOTE — Discharge Instructions (Addendum)
Transurethral Resection of the Prostate (TURP) or Greenlight laser ablation of the Prostate  Care After  Refer to this sheet in the next few weeks. These discharge instructions provide you with general information on caring for yourself after you leave the hospital. Your caregiver may also give you specific instructions. Your treatment has been planned according to the most current medical practices available, but unavoidable complications sometimes occur. If you have any problems or questions after discharge, please call your caregiver.  HOME CARE INSTRUCTIONS   Medications You may receive medicine for pain management. As your level of discomfort decreases, adjustments in your pain medicines may be made.  Take all medicines as directed.  You may be given a medicine (antibiotic) to kill germs following surgery. Finish all medicines. Let your caregiver know if you have any side effects or problems from the medicine.  If you are on aspirin, it would be best not to restart the aspirin until the blood in the urine clears Hygiene You can take a shower after surgery.  You should not take a bath while you still have the urethral catheter. Activity You will be encouraged to get out of bed as much as possible and increase your activity level as tolerated.  Spend the first week in and around your home. For 3 weeks, avoid the following:  Straining.  Running.  Strenuous work.  Walks longer than a few blocks.  Riding for extended periods.  Sexual relations.  Do not lift heavy objects (more than 20 pounds) for at least 1 month. When lifting, use your arms instead of your abdominal muscles.  You will be encouraged to walk as tolerated. Do not exert yourself. Increase your activity level slowly. Remember that it is important to keep moving after an operation of any type. This cuts down on the possibility of developing blood clots.  Your caregiver will tell you when you can resume driving and light  housework. Discuss this at your first office visit after discharge. Diet No special diet is ordered after a TURP. However, if you are on a special diet for another medical problem, it should be continued.  Normal fluid intake is usually recommended.  Avoid alcohol and caffeinated drinks for 2 weeks. They irritate the bladder. Decaffeinated drinks are okay.  Avoid spicy foods.  Bladder Function For the first 10 days, empty the bladder whenever you feel a definite desire. Do not try to hold the urine for long periods of time.  Urinating once or twice a night even after you are healed is not uncommon.  You may see some recurrence of blood in the urine after discharge from the hospital. This usually happens within 2 weeks after the procedure.If this occurs, force fluids again as you did in the hospital and reduce your activity.  Bowel Function You may experience some constipation after surgery. This can be minimized by increasing fluids and fiber in your diet. Drink enough water and fluids to keep your urine clear or pale yellow.  A stool softener may be prescribed for use at home. Do not strain to move your bowels.  If you are requiring increased pain medicine, it is important that you take stool softeners to prevent constipation. This will help to promote proper healing by reducing the need to strain to move your bowels.  Sexual Activity Semen movement in the opposite direction and into the bladder (retrograde ejaculation) may occur. Since the semen passes into the bladder, cloudy urine can occur the first time you urinate  after intercourse. Or, you may not have an ejaculation during erection. Ask your caregiver when you can resume sexual activity. Retrograde ejaculation and reduced semen discharge should not reduce one's pleasure of intercourse.  Postoperative Visit Arrange the date and time of your after surgery visit with your caregiver.  Return to Work After your recovery is complete, you will  be able to return to work and resume all activities. Your caregiver will inform you when you can return to work.    Foley Catheter Care A soft, flexible tube (Foley catheter) may have been placed in your bladder to drain urine and fluid. Follow these instructions: Taking Care of the Catheter Keep the area where the catheter leaves your body clean.  Attach the catheter to the leg so there is no tension on the catheter.  Keep the drainage bag below the level of the bladder, but keep it OFF the floor.  Do not take long soaking baths. Your caregiver will give instructions about showering.  Wash your hands before touching ANYTHING related to the catheter or bag.  Using mild soap and warm water on a washcloth:  Clean the area closest to the catheter insertion site using a circular motion around the catheter.  Clean the catheter itself by wiping AWAY from the insertion site for several inches down the tube.  NEVER wipe upward as this could sweep bacteria up into the urethra (tube in your body that normally drains the bladder) and cause infection.  Place a small amount of sterile lubricant at the tip of the penis where the catheter is entering.  Taking Care of the Drainage Bags Two drainage bags may be taken home: a large overnight drainage bag, and a smaller leg bag which fits underneath clothing.  It is okay to wear the overnight bag at any time, but NEVER wear the smaller leg bag at night.  Keep the drainage bag well below the level of your bladder. This prevents backflow of urine into the bladder and allows the urine to drain freely.  Anchor the tubing to your leg to prevent pulling or tension on the catheter. Use tape or a leg strap provided by the hospital.  Empty the drainage bag when it is 1/2 to 3/4 full. Wash your hands before and after touching the bag.  Periodically check the tubing for kinks to make sure there is no pressure on the tubing which could restrict the flow of urine.  Changing  the Drainage Bags Cleanse both ends of the clean bag with alcohol before changing.  Pinch off the rubber catheter to avoid urine spillage during the disconnection.  Disconnect the dirty bag and connect the clean one.  Empty the dirty bag carefully to avoid a urine spill.  Attach the new bag to the leg with tape or a leg strap.  Cleaning the Drainage Bags Whenever a drainage bag is disconnected, it must be cleaned quickly so it is ready for the next use.  Wash the bag in warm, soapy water.  Rinse the bag thoroughly with warm water.  Soak the bag for 30 minutes in a solution of white vinegar and water (1 cup vinegar to 1 quart warm water).  Rinse with warm water.  SEEK MEDICAL CARE IF:  You have chills or night sweats.  You are leaking around your catheter or have problems with your catheter. It is not uncommon to have sporadic leakage around your catheter as a result of bladder spasms. If the leakage stops, there is not  much need for concern. If you are uncertain, call your caregiver.  You develop side effects that you think are coming from your medicines.  SEEK IMMEDIATE MEDICAL CARE IF:  You are suddenly unable to urinate. Check to see if there are any kinks in the drainage tubing that may cause this. If you cannot find any kinks, call your caregiver immediately. This is an emergency.  You develop shortness of breath or chest pains.  Bleeding persists or clots develop in your urine.  You have a fever.  You develop pain in your back or over your lower belly (abdomen).  You develop pain or swelling in your legs.  Any problems you are having get worse rather than better.  MAKE SURE YOU:  Understand these instructions.  Will watch your condition.  Will get help right away if you are not doing well or get worse.    [facility] [phone] Post Ureteroscopy With or Without Stent Instructions  Definitions:  Ureter: The duct that transports urine from the kidney to the  bladder. Stent:   A plastic hollow tube that is placed into the ureter, from the kidney to the                 bladder to prevent the ureter from swelling shut.  GENERAL INSTRUCTIONS:  Despite the fact that no skin incisions were used, the area around the ureter and bladder is raw and irritated. The stent is a foreign body which will further irritate the bladder wall. This irritation is manifested by increased frequency of urination, both day and night, and by an increase in the urge to urinate. In some, the urge to urinate is present almost always. Sometimes the urge is strong enough that you may not be able to stop yourself from urinating. The only real cure is to remove the stent and then give time for the bladder wall to heal which can't be done until the danger of the ureter swelling shut has passed, which varies.  You may see some blood in your urine while the stent is in place and a few days afterwards. Do not be alarmed, even if the urine was clear for a while. Get off your feet and drink lots of fluids until clearing occurs. If you start to pass clots or don't improve, call us.  DIET: You may return to your normal diet immediately. Because of the raw surface of your bladder, alcohol, spicy foods, acid type foods and drinks with caffeine may cause irritation or frequency and should be used in moderation. To keep your urine flowing freely and to avoid constipation, drink plenty of fluids during the day ( 8-10 glasses ). Tip: Avoid cranberry juice because it is very acidic.  ACTIVITY: Your physical activity doesn't need to be restricted. However, if you are very active, you may see some blood in your urine. We suggest that you reduce your activity under these circumstances until the bleeding has stopped.  BOWELS: It is important to keep your bowels regular during the postoperative period. Straining with bowel movements can cause bleeding. A bowel movement every other day is reasonable. Use a  mild laxative if needed, such as Milk of Magnesia 2-3 tablespoons, or 2 Dulcolax tablets. Call if you continue to have problems. If you have been taking narcotics for pain, before, during or after your surgery, you may be constipated. Take a laxative if necessary.   MEDICATION: You should resume your pre-surgery medications unless told not to. You may  take oxybutynin or flomax if prescribed for bladder spasms or discomfort from the stent Take pain medication as directed for pain refractory to conservative management  PROBLEMS YOU SHOULD REPORT TO [place]: Fevers over 100.5 Fahrenheit. Heavy bleeding, or clots ( See above notes about blood in urine ). Inability to urinate. Drug reactions ( hives, rash, nausea, vomiting, diarrhea ). Severe burning or pain with urination that is not improving.

## 2023-08-17 NOTE — Transfer of Care (Signed)
Immediate Anesthesia Transfer of Care Note  Patient: Weston Whittaker  Procedure(s) Performed: CYSTOSCOPY/RIGHT URETEROSCOPY/HOLMIUM LASER/STENT PLACEMENT AND RETROGRADE PYELOGRAM (Right: Ureter) TRANSURETHRAL RESECTION OF THE PROSTATE (TURP) (Ureter)  Patient Location: PACU  Anesthesia Type:General  Level of Consciousness: awake, alert , oriented, and patient cooperative  Airway & Oxygen Therapy: Patient Spontanous Breathing and Patient connected to face mask oxygen  Post-op Assessment: Report given to RN and Post -op Vital signs reviewed and stable  Post vital signs: Reviewed and stable  Last Vitals:  Vitals Value Taken Time  BP 109/63 08/17/23 1505  Temp 36.5 C 08/17/23 1505  Pulse 74 08/17/23 1507  Resp 11 08/17/23 1507  SpO2 92 % 08/17/23 1507  Vitals shown include unfiled device data.  Last Pain:  Vitals:   08/17/23 1022  TempSrc:   PainSc: 0-No pain      Patients Stated Pain Goal: 4 (08/17/23 1022)  Complications: No notable events documented.

## 2023-08-17 NOTE — Anesthesia Postprocedure Evaluation (Signed)
Anesthesia Post Note  Patient: Steven Steele  Procedure(s) Performed: CYSTOSCOPY/RIGHT URETEROSCOPY/HOLMIUM LASER/STENT PLACEMENT AND RETROGRADE PYELOGRAM (Right: Ureter) TRANSURETHRAL RESECTION OF THE PROSTATE (TURP) (Ureter)     Patient location during evaluation: PACU Anesthesia Type: General Level of consciousness: awake and alert, oriented and patient cooperative Pain management: pain level controlled Vital Signs Assessment: post-procedure vital signs reviewed and stable Respiratory status: spontaneous breathing, nonlabored ventilation and respiratory function stable Cardiovascular status: blood pressure returned to baseline and stable Postop Assessment: no apparent nausea or vomiting Anesthetic complications: no   No notable events documented.  Last Vitals:  Vitals:   08/17/23 1530 08/17/23 1545  BP: 114/65 109/68  Pulse: 78 84  Resp: 14 15  Temp:    SpO2: 92% 92%    Last Pain:  Vitals:   08/17/23 1538  TempSrc:   PainSc: 6                  Lannie Fields

## 2023-08-18 ENCOUNTER — Other Ambulatory Visit: Payer: Self-pay | Admitting: Nurse Practitioner

## 2023-08-18 ENCOUNTER — Other Ambulatory Visit (HOSPITAL_COMMUNITY): Payer: Self-pay

## 2023-08-18 DIAGNOSIS — R3914 Feeling of incomplete bladder emptying: Secondary | ICD-10-CM | POA: Diagnosis not present

## 2023-08-18 DIAGNOSIS — N401 Enlarged prostate with lower urinary tract symptoms: Secondary | ICD-10-CM | POA: Diagnosis not present

## 2023-08-18 DIAGNOSIS — N201 Calculus of ureter: Secondary | ICD-10-CM | POA: Diagnosis not present

## 2023-08-18 DIAGNOSIS — N320 Bladder-neck obstruction: Secondary | ICD-10-CM | POA: Diagnosis not present

## 2023-08-18 MED ORDER — OXYBUTYNIN CHLORIDE 5 MG PO TABS
5.0000 mg | ORAL_TABLET | Freq: Once | ORAL | Status: AC
  Administered 2023-08-18: 5 mg via ORAL

## 2023-08-18 MED ORDER — POLYETHYLENE GLYCOL 3350 17 G PO PACK
17.0000 g | PACK | Freq: Every day | ORAL | 0 refills | Status: DC
  Filled 2023-08-18: qty 14, 14d supply, fill #0

## 2023-08-18 MED ORDER — OXYBUTYNIN CHLORIDE 5 MG PO TABS: 5.0000 mg | ORAL_TABLET | Freq: Three times a day (TID) | ORAL | 0 refills | Status: DC

## 2023-08-18 MED ORDER — HYDROCODONE-ACETAMINOPHEN 5-325 MG PO TABS: 1.0000 | ORAL_TABLET | Freq: Four times a day (QID) | ORAL | 0 refills | Status: DC | PRN

## 2023-08-18 NOTE — Progress Notes (Signed)
 Discharge medications delivered in a secure bag to pt in room by this RN

## 2023-08-18 NOTE — Plan of Care (Signed)

## 2023-08-18 NOTE — Discharge Summary (Signed)
Date of admission: 08/17/2023  Date of discharge: 08/18/2023  Admission diagnosis: BPH  Discharge diagnosis: BPH  Secondary diagnoses:  Patient Active Problem List   Diagnosis Date Noted   BPH (benign prostatic hyperplasia) 08/17/2023   Angioedema 07/11/2016   Allergic reaction 07/11/2016   Elevated blood pressure reading 07/11/2016   Perennial allergic rhinitis 07/11/2016   GBS (Guillain Barre syndrome) (HCC)    Paresthesias 08/29/2015   Hand weakness 08/29/2015   Paresthesia 08/29/2015    Procedures performed: Procedure(s): CYSTOSCOPY/RIGHT URETEROSCOPY/HOLMIUM LASER/STENT PLACEMENT AND RETROGRADE PYELOGRAM TRANSURETHRAL RESECTION OF THE PROSTATE (TURP)  History and Physical: For full details, please see admission history and physical. Briefly, Steven Steele is a 62 y.o. year old patient with BPH s/p TURP on 12/27.   Hospital Course: Patient tolerated the procedure well.  He was then transferred to the floor after an uneventful PACU stay.  His hospital course was uncomplicated.  On POD#1 he had met discharge criteria: was eating a regular diet, was up and ambulating independently,  pain was well controlled. He had one episode of clot occluding the catheter overnight. The cath was irrigated and his urine is light pink off CBI this AM. Patient ok to be discharged home.  Physical Exam: Gen: NAD Resp: normla WOB Cards: RRR per monitor GU: 3 way catheter in place CBI off urine light pink   Laboratory values:  No results for input(s): "WBC", "HGB", "HCT" in the last 72 hours. No results for input(s): "NA", "K", "CL", "CO2", "GLUCOSE", "BUN", "CREATININE", "CALCIUM" in the last 72 hours. No results for input(s): "LABPT", "INR" in the last 72 hours. No results for input(s): "LABURIN" in the last 72 hours. Results for orders placed or performed during the hospital encounter of 08/28/15  CSF culture     Status: None   Collection Time: 08/29/15 12:48 PM   Specimen: CSF; Synovial  Fluid  Result Value Ref Range Status   Specimen Description CSF  Final   Special Requests NONE  Final   Gram Stain CYTOSPIN SMEAR NO WBC SEEN NO ORGANISMS SEEN   Final   Culture NO GROWTH 3 DAYS  Final   Report Status 09/01/2015 FINAL  Final    Disposition: Home  Discharge instruction: The patient was instructed to be ambulatory but told to refrain from heavy lifting, strenuous activity, or driving.   Discharge medications:  Allergies as of 08/18/2023       Reactions   Crab [shellfish Allergy] Anaphylaxis   Alpha gal   Meat Extract Other (See Comments)    red meat-alpha gal (GI issues)--(resulted in hospitalization)   Alpha-gal Other (See Comments)   Beef-derived Drug Products    HAS ALPHA-GAL    Pork Allergy Other (See Comments)   GI issues (resulted in hospitalization) ALPHA GAL        Medication List     TAKE these medications    amLODipine 10 MG tablet Commonly known as: NORVASC Take 10 mg by mouth in the morning.   cetirizine 10 MG tablet Commonly known as: ZYRTEC Take 10 mg by mouth at bedtime.   EPINEPHrine 0.3 mg/0.3 mL Soaj injection Commonly known as: EPI-PEN Inject 0.3 mg into the muscle as needed for anaphylaxis.   finasteride 5 MG tablet Commonly known as: PROSCAR Take 5 mg by mouth in the morning.   HYDROcodone-acetaminophen 5-325 MG tablet Commonly known as: Norco Take 1 tablet by mouth every 4 (four) hours as needed.   polyethylene glycol 17 g packet Commonly known as: MiraLax  Take 17 g by mouth daily.   tamsulosin 0.4 MG Caps capsule Commonly known as: FLOMAX Take 0.4 mg by mouth in the morning.        Followup:   Follow-up Information     Vanessa Barbara, NP Follow up on 08/27/2023.   Why: 830 AM for catheter and stent removal Contact information: [address] [place] [address] [phone]

## 2023-08-20 ENCOUNTER — Encounter (HOSPITAL_COMMUNITY): Payer: Self-pay | Admitting: Urology

## 2023-08-21 ENCOUNTER — Inpatient Hospital Stay (HOSPITAL_COMMUNITY): Payer: Federal, State, Local not specified - PPO

## 2023-08-21 ENCOUNTER — Inpatient Hospital Stay (HOSPITAL_COMMUNITY): Payer: Federal, State, Local not specified - PPO | Admitting: Anesthesiology

## 2023-08-21 ENCOUNTER — Other Ambulatory Visit: Payer: Self-pay | Admitting: Urology

## 2023-08-21 ENCOUNTER — Encounter (HOSPITAL_COMMUNITY)
Admission: RE | Disposition: A | Discharge status: Home / Self Care | Payer: Self-pay | Source: Home / Self Care | Attending: Urology

## 2023-08-21 ENCOUNTER — Ambulatory Visit (HOSPITAL_COMMUNITY)
Admission: RE | Admit: 2023-08-21 | Discharge: 2023-08-21 | Disposition: A | Discharge status: Home / Self Care | Payer: Federal, State, Local not specified - PPO | Attending: Urology | Admitting: Urology

## 2023-08-21 ENCOUNTER — Encounter (HOSPITAL_COMMUNITY): Payer: Self-pay | Admitting: Urology

## 2023-08-21 DIAGNOSIS — I10 Essential (primary) hypertension: Secondary | ICD-10-CM | POA: Insufficient documentation

## 2023-08-21 DIAGNOSIS — Z6834 Body mass index (BMI) 34.0-34.9, adult: Secondary | ICD-10-CM | POA: Diagnosis not present

## 2023-08-21 DIAGNOSIS — N23 Unspecified renal colic: Secondary | ICD-10-CM | POA: Insufficient documentation

## 2023-08-21 DIAGNOSIS — Z960 Presence of urogenital implants: Secondary | ICD-10-CM | POA: Diagnosis not present

## 2023-08-21 DIAGNOSIS — R310 Gross hematuria: Secondary | ICD-10-CM | POA: Insufficient documentation

## 2023-08-21 DIAGNOSIS — E669 Obesity, unspecified: Secondary | ICD-10-CM | POA: Diagnosis not present

## 2023-08-21 DIAGNOSIS — Z466 Encounter for fitting and adjustment of urinary device: Secondary | ICD-10-CM | POA: Insufficient documentation

## 2023-08-21 DIAGNOSIS — R102 Pelvic and perineal pain: Secondary | ICD-10-CM | POA: Diagnosis not present

## 2023-08-21 DIAGNOSIS — Z9079 Acquired absence of other genital organ(s): Secondary | ICD-10-CM | POA: Diagnosis not present

## 2023-08-21 DIAGNOSIS — Z79899 Other long term (current) drug therapy: Secondary | ICD-10-CM | POA: Insufficient documentation

## 2023-08-21 DIAGNOSIS — G4730 Sleep apnea, unspecified: Secondary | ICD-10-CM | POA: Insufficient documentation

## 2023-08-21 HISTORY — PX: CYSTOSCOPY WITH FULGERATION: SHX6638

## 2023-08-21 LAB — POCT I-STAT, CHEM 8
BUN: 18 mg/dL (ref 6–20)
Calcium, Ion: 0.96 mmol/L — ABNORMAL LOW (ref 1.15–1.40)
Chloride: 106 mmol/L (ref 98–111)
Creatinine, Ser: 0.8 mg/dL (ref 0.61–1.24)
Glucose, Bld: 99 mg/dL (ref 70–99)
HCT: 44 % (ref 39.0–52.0)
Hemoglobin: 15 g/dL (ref 13.0–17.0)
Potassium: 4.2 mmol/L (ref 3.5–5.1)
Sodium: 134 mmol/L — ABNORMAL LOW (ref 135–145)
TCO2: 23 mmol/L (ref 22–32)

## 2023-08-21 LAB — SURGICAL PATHOLOGY

## 2023-08-21 SURGERY — CYSTOSCOPY, WITH BLADDER FULGURATION
Anesthesia: General | Site: Bladder | Laterality: Right

## 2023-08-21 MED ORDER — ACETAMINOPHEN 500 MG PO TABS
1000.0000 mg | ORAL_TABLET | Freq: Once | ORAL | Status: AC
  Administered 2023-08-21: 1000 mg via ORAL
  Filled 2023-08-21: qty 2

## 2023-08-21 MED ORDER — ONDANSETRON HCL 4 MG/2ML IJ SOLN
INTRAMUSCULAR | Status: DC | PRN
  Administered 2023-08-21: 4 mg via INTRAVENOUS

## 2023-08-21 MED ORDER — PROPOFOL 10 MG/ML IV BOLUS
INTRAVENOUS | Status: AC
  Filled 2023-08-21: qty 20

## 2023-08-21 MED ORDER — FENTANYL CITRATE (PF) 100 MCG/2ML IJ SOLN
INTRAMUSCULAR | Status: AC
  Filled 2023-08-21: qty 2

## 2023-08-21 MED ORDER — ONDANSETRON HCL 4 MG/2ML IJ SOLN: 4.0000 mg | Freq: Once | INTRAMUSCULAR | Status: DC | PRN

## 2023-08-21 MED ORDER — CEFAZOLIN SODIUM 1 G IJ SOLR
INTRAMUSCULAR | Status: AC
  Filled 2023-08-21: qty 20

## 2023-08-21 MED ORDER — HYDROCODONE-ACETAMINOPHEN 5-325 MG PO TABS: 1.0000 | ORAL_TABLET | Freq: Four times a day (QID) | ORAL | 0 refills | Status: DC | PRN

## 2023-08-21 MED ORDER — SODIUM CHLORIDE 0.9 % IR SOLN
Status: DC | PRN
  Administered 2023-08-21: 3000 mL

## 2023-08-21 MED ORDER — MIDAZOLAM HCL 5 MG/5ML IJ SOLN
INTRAMUSCULAR | Status: DC | PRN
  Administered 2023-08-21: 2 mg via INTRAVENOUS

## 2023-08-21 MED ORDER — FENTANYL CITRATE PF 50 MCG/ML IJ SOSY: 50.0000 ug | PREFILLED_SYRINGE | Freq: Once | INTRAMUSCULAR | Status: AC

## 2023-08-21 MED ORDER — MIDAZOLAM HCL 2 MG/2ML IJ SOLN
INTRAMUSCULAR | Status: AC
  Filled 2023-08-21: qty 2

## 2023-08-21 MED ORDER — FENTANYL CITRATE (PF) 100 MCG/2ML IJ SOLN
INTRAMUSCULAR | Status: DC | PRN
  Administered 2023-08-21: 100 ug via INTRAVENOUS

## 2023-08-21 MED ORDER — OXYCODONE HCL 5 MG/5ML PO SOLN: 5.0000 mg | Freq: Once | ORAL | Status: DC | PRN

## 2023-08-21 MED ORDER — CHLORHEXIDINE GLUCONATE 0.12 % MT SOLN
15.0000 mL | Freq: Once | OROMUCOSAL | Status: AC
  Administered 2023-08-21: 15 mL via OROMUCOSAL

## 2023-08-21 MED ORDER — PROPOFOL 10 MG/ML IV BOLUS
INTRAVENOUS | Status: DC | PRN
  Administered 2023-08-21: 100 mg via INTRAVENOUS
  Administered 2023-08-21: 200 mg via INTRAVENOUS

## 2023-08-21 MED ORDER — HYDROMORPHONE HCL 1 MG/ML IJ SOLN: 0.2500 mg | INTRAMUSCULAR | Status: DC | PRN

## 2023-08-21 MED ORDER — AMISULPRIDE (ANTIEMETIC) 5 MG/2ML IV SOLN
10.0000 mg | Freq: Once | INTRAVENOUS | Status: DC | PRN
Start: 2023-08-21 — End: 2023-08-21

## 2023-08-21 MED ORDER — LIDOCAINE HCL (PF) 2 % IJ SOLN
INTRAMUSCULAR | Status: AC
  Filled 2023-08-21: qty 5

## 2023-08-21 MED ORDER — CEFAZOLIN SODIUM-DEXTROSE 2-4 GM/100ML-% IV SOLN
2.0000 g | INTRAVENOUS | Status: AC
Start: 2023-08-21 — End: 2023-08-21
  Administered 2023-08-21: 2 g via INTRAVENOUS

## 2023-08-21 MED ORDER — DEXAMETHASONE SODIUM PHOSPHATE 4 MG/ML IJ SOLN
INTRAMUSCULAR | Status: DC | PRN
  Administered 2023-08-21: 6 mg via INTRAVENOUS

## 2023-08-21 MED ORDER — OXYCODONE HCL 5 MG PO TABS: 5.0000 mg | ORAL_TABLET | Freq: Once | ORAL | Status: DC | PRN

## 2023-08-21 MED ORDER — LIDOCAINE HCL (CARDIAC) PF 100 MG/5ML IV SOSY
PREFILLED_SYRINGE | INTRAVENOUS | Status: DC | PRN
  Administered 2023-08-21: 80 mg via INTRAVENOUS

## 2023-08-21 MED ORDER — FENTANYL CITRATE PF 50 MCG/ML IJ SOSY
PREFILLED_SYRINGE | INTRAMUSCULAR | Status: AC
  Administered 2023-08-21: 50 ug via INTRAVENOUS
  Filled 2023-08-21: qty 1

## 2023-08-21 MED ORDER — EPHEDRINE SULFATE (PRESSORS) 50 MG/ML IJ SOLN
INTRAMUSCULAR | Status: DC | PRN
  Administered 2023-08-21: 10 mg via INTRAVENOUS

## 2023-08-21 MED ORDER — PHENYLEPHRINE HCL (PRESSORS) 10 MG/ML IV SOLN
INTRAVENOUS | Status: DC | PRN
  Administered 2023-08-21 (×5): 160 ug via INTRAVENOUS

## 2023-08-21 MED ORDER — LACTATED RINGERS IV SOLN: INTRAVENOUS | Status: DC

## 2023-08-21 SURGICAL SUPPLY — 16 items
BAG URINE DRAIN 2000ML AR STRL (UROLOGICAL SUPPLIES) IMPLANT
BAG URO CATCHER STRL LF (MISCELLANEOUS) ×1 IMPLANT
CATH FOLEY 2WAY SLVR 5CC 18FR (CATHETERS) IMPLANT
DRAPE FOOT SWITCH (DRAPES) ×1 IMPLANT
ELECT REM PT RETURN 15FT ADLT (MISCELLANEOUS) ×1 IMPLANT
GLOVE BIO SURGEON STRL SZ7.5 (GLOVE) ×1 IMPLANT
GOWN STRL REUS W/ TWL XL LVL3 (GOWN DISPOSABLE) ×1 IMPLANT
KIT TURNOVER KIT A (KITS) IMPLANT
LOOP CUT BIPOLAR 24F LRG (ELECTROSURGICAL) IMPLANT
MANIFOLD NEPTUNE II (INSTRUMENTS) ×1 IMPLANT
PACK CYSTO (CUSTOM PROCEDURE TRAY) ×1 IMPLANT
PLUG CATH AND CAP STRL 200 (CATHETERS) IMPLANT
SYR TOOMEY IRRIG 70ML (MISCELLANEOUS)
SYRINGE TOOMEY IRRIG 70ML (MISCELLANEOUS) IMPLANT
TUBING CONNECTING 10 (TUBING) ×1 IMPLANT
TUBING UROLOGY SET (TUBING) ×1 IMPLANT

## 2023-08-21 NOTE — Anesthesia Procedure Notes (Signed)
 Procedure Name: LMA Insertion Date/Time: 08/21/2023 2:02 PM  Performed by: Vincenzo Show, CRNAPre-anesthesia Checklist: Patient identified, Emergency Drugs available, Suction available, Patient being monitored and Timeout performed Patient Re-evaluated:Patient Re-evaluated prior to induction Oxygen Delivery Method: Circle system utilized Preoxygenation: Pre-oxygenation with 100% oxygen Induction Type: IV induction Ventilation: Mask ventilation without difficulty LMA: LMA inserted LMA Size: 4.0 Tube size: 4.0 mm Number of attempts: 1 Placement Confirmation: positive ETCO2 and breath sounds checked- equal and bilateral Tube secured with: Tape Dental Injury: Teeth and Oropharynx as per pre-operative assessment

## 2023-08-21 NOTE — H&P (Signed)
 CC/HPI: Patient underwent right ureteroscopy with laser lithotripsy and stone extraction and TURP. He has Guillain-Barr and is very sensitive to pain and discomfort. Had a lot of trouble with catheter over the weekend and came in today for voiding trial. Continues to have severe pain with the stent. Also having some clot per urethra.     ALLERGIES: Beef/Pork - alphagal Seafood - Anaphylaxis, can't breathe    MEDICATIONS: Finasteride  5 mg tablet 1 tablet PO Daily  Tamsulosin  Hcl 0.4 mg capsule 1 capsule PO Daily  Amlodipine  Besilate  Hydrocodone -Acetaminophen  5 mg-325 mg tablet 1 tablet PO Q 6 H  Multivitamin     GU PSH: Complex cystometrogram, with voiding pressure studies, any technique - 07/03/2023 Complex Uroflow - 07/03/2023 Emg surf Electrd - 07/03/2023     NON-GU PSH: Visit Complexity (formerly GPC1X) - 07/12/2023, 05/23/2023, 03/19/2023     GU PMH: BPH w/LUTS - 07/27/2023, - 07/12/2023, - 07/03/2023, - 05/23/2023, - 03/19/2023, - 2022 (Worsening), He does have BPH by exam. He has mild voiding symptoms., - 2019 Incomplete bladder emptying - 07/27/2023, - 07/12/2023 Ureteral calculus - 07/27/2023, - 07/12/2023 Nocturia (Stable) - 07/12/2023, - 07/03/2023, - 05/23/2023 (Stable), - 03/19/2023, - 2022 (Improving), He has had improvement with both the Myrbetriq and the CPAP but doesn't want to continue on medication long-term if possible. I told him that that was fine with me and if he had further difficulties I would be happy to in follow-up. , - 2018, While his nocturia area has improved somewhat with the use of a CPAP mask he continues to be bothered by both daytime and nighttime voiding symptoms., - 2018 Urinary Frequency (Stable) - 07/12/2023, - 07/03/2023, - 05/23/2023, - 03/19/2023 Urinary Hesitancy - 07/12/2023 Weak Urinary Stream (Stable) - 07/12/2023, - 07/03/2023, - 05/23/2023, - 03/19/2023 Urinary Urgency - 07/03/2023, - 05/23/2023, - 03/19/2023 Elevated PSA - 05/23/2023, - 03/19/2023  (Improving), His prostate is benign and his PSA has remained at an acceptable level currently at 2.58. What I have recommended is continued annual DRE and PSA with an extra PSA being checked in 6 months from now to assure stability., - 2020, I noted no abnormality on his DRE. He understands the slight risk of continued monitoring of his PSA but has elected to have this repeated again in 3 months with abstinence prior to having his PSA drawn. He also understands that if his PSA continues to rise my recommendation is that we would then proceed with a prostate biopsy, - 2019 Overactive bladder, His symptoms are suggestive of bladder overactivity. He empties his bladder adequately and has clear urine so I am going to begin a trial of Myrbetriq 25 mg. I'll then have him return to reassess how this has worked for him in recheck his PVR. - 2018    NON-GU PMH: Tinea unguium - 2022 GERD Hypertension    FAMILY HISTORY: Cancer - Father, Mother   SOCIAL HISTORY: Marital Status: Married Preferred Language: English Current Smoking Status: Unknown if patient still smokes.   Tobacco Use Assessment Completed: Used Tobacco in last 30 days? Does not drink anymore.  Drinks 3 caffeinated drinks per day.    REVIEW OF SYSTEMS:    GU Review Male:   Patient denies frequent urination, hard to postpone urination, burning/ pain with urination, get up at night to urinate, leakage of urine, stream starts and stops, trouble starting your stream, have to strain to urinate , erection problems, and penile pain.  Gastrointestinal (Upper):   Patient denies nausea, vomiting,  and indigestion/ heartburn.  Gastrointestinal (Lower):   Patient denies diarrhea and constipation.  Constitutional:   Patient denies fever, night sweats, weight loss, and fatigue.  Skin:   Patient denies skin rash/ lesion and itching.  Eyes:   Patient denies blurred vision and double vision.  Ears/ Nose/ Throat:   Patient denies sore throat and sinus  problems.  Hematologic/Lymphatic:   Patient denies swollen glands and easy bruising.  Cardiovascular:   Patient denies leg swelling and chest pains.  Respiratory:   Patient denies cough and shortness of breath.  Endocrine:   Patient denies excessive thirst.  Musculoskeletal:   Patient denies back pain and joint pain.  Neurological:   Patient denies headaches and dizziness.  Psychologic:   Patient denies depression and anxiety.   VITAL SIGNS: None   GU PHYSICAL EXAMINATION:    Penis: Circumcised, no foreskin warts, no cracks. No dorsal peyronie's plaques, no left corporal peyronie's plaques, no right corporal peyronie's plaques, no scarring, no shaft warts. No balanitis, no meatal stenosis. Blood clot per urethra   MULTI-SYSTEM PHYSICAL EXAMINATION:       Complexity of Data:  Source Of History:  Patient  Records Review:   Previous Doctor Records, Previous Patient Records   05/23/23 03/19/23 11/01/20 06/18/19 12/06/18 09/19/18 06/06/18 01/02/17  PSA  Total PSA 4.52 ng/mL 5.15 ng/mL 3.83 ng/mL 2.57 ng/mL 2.58 ng/mL 2.54 ng/mL 3.74 ng/dl 7.04 ng/dl    PROCEDURES:          Ketoralac 60mg  - [id number], [id number] Ketoralac 60 mg given and 0 wasted.   Qty: 60 Adm. By: Lovestar Francois  Unit: mg Lot No [id number]  Route: IM Exp. Date 12/14/2022  Freq: None Mfgr.:   Site: Right Hip   ASSESSMENT:      ICD-10 Details  1 GU:   Gross hematuria - R31.0 Undiagnosed New Problem  2   Pelvic/perineal pain - R10.2 Undiagnosed New Problem   PLAN:           Document Letter(s):  Created for Patient: Clinical Summary         Notes:   Is having a lot of pain from the stent. Discussed go ahead and removing the stent. Given the significant pain he is having we will plan to perform this in the operating room. Also plan for possible clot evacuation fulguration since he has some blood clots over the weekend.    Signed by Sherwood Edison, III, M.D. on 08/21/23 at 1:19 PM (EST

## 2023-08-21 NOTE — Anesthesia Preprocedure Evaluation (Addendum)
 Anesthesia Evaluation  Patient identified by MRN, date of birth, ID band Patient awake    Reviewed: Allergy  & Precautions, NPO status , Patient's Chart, lab work & pertinent test results  Airway Mallampati: III  TM Distance: >3 FB Neck ROM: Full    Dental  (+) Teeth Intact, Dental Advisory Given   Pulmonary sleep apnea and Continuous Positive Airway Pressure Ventilation , Current Smoker and Patient abstained from smoking.   Pulmonary exam normal breath sounds clear to auscultation       Cardiovascular hypertension (129/78 preop), Pt. on medications Normal cardiovascular exam Rhythm:Regular Rate:Normal     Neuro/Psych negative neurological ROS  negative psych ROS   GI/Hepatic ,GERD  Controlled,,(+)     substance abuse (remote hx etoh abuse)  alcohol use  Endo/Other  Obesity BMI 34  Renal/GU Gross hematuria s/p TURP 12/27  negative genitourinary   Musculoskeletal negative musculoskeletal ROS (+)    Abdominal   Peds  Hematology negative hematology ROS (+)   Anesthesia Other Findings Alpha gal  Reproductive/Obstetrics negative OB ROS                             Anesthesia Physical Anesthesia Plan  ASA: 2  Anesthesia Plan: General   Post-op Pain Management:    Induction: Intravenous  PONV Risk Score and Plan: Ondansetron , Dexamethasone , Midazolam  and Treatment may vary due to age or medical condition  Airway Management Planned: LMA  Additional Equipment: None  Intra-op Plan:   Post-operative Plan: Extubation in OR  Informed Consent: I have reviewed the patients History and Physical, chart, labs and discussed the procedure including the risks, benefits and alternatives for the proposed anesthesia with the patient or authorized representative who has indicated his/her understanding and acceptance.     Dental advisory given  Plan Discussed with: CRNA  Anesthesia Plan  Comments: (LMA 4 with last anesthetic)        Anesthesia Quick Evaluation

## 2023-08-21 NOTE — Op Note (Signed)
 Operative Note  Preoperative diagnosis:  1.  Right retained ureteral stent 2.  Right renal colic 3.  Gross hematuria  Postoperative diagnosis: 1.  Same  Procedure(s): 1.  Cystoscopy with right retained ureteral stent removal, simple  Surgeon: Sherwood Edison, MD  Assistants: None  Anesthesia: General  Complications: None immediate  EBL: Minimal  Specimens: 1.  None  Drains/Catheters: 1.  None  Intraoperative findings: 1.  Anterior urethra with significant inflammation but no stricture.  Some mild edema.  Prostatic urethra with evidence of prior transurethral resection.  Looked wide open.  No active bleeding. 3.  Bladder with minimal blood clots that were evacuated. 4.  Right ureteral stent removed  Indication: 62 year old male status post TURP and right ureteroscopy with stone extraction and stent placement presented to clinic with gross hematuria and severe pain.  Presents for stent removal and cystoscopy with possible clot evacuation.  Description of procedure:  The patient was identified and consent was obtained.  The patient was taken to the operating room and placed in the supine position.  The patient was placed under general anesthesia.  Perioperative antibiotics were administered.  The patient was placed in dorsal lithotomy.  Patient was prepped and draped in a standard sterile fashion and a timeout was performed.  A 21 French rigid cystoscope was advanced into the urethra and into the bladder.  Complete cystoscopy was performed with findings noted above.  Minimal clots were evacuated.  The right stent was grasped and removed.  Readvanced the scope into the urethra and into the bladder.  There was no active bleeding noted from the bladder mucosa or the prostatic mucosa.  I withdrew the scope visualizing the urethra upon removal with findings noted above.  No obvious active bleeding.  I chose to leaving without catheter to see if he can void after the procedure.  Plan:  Follow-up in 1 week for reassessment.

## 2023-08-21 NOTE — Transfer of Care (Signed)
 Immediate Anesthesia Transfer of Care Note  Patient: Steven Steele  Procedure(s) Performed: Procedure(s): CYSTOSCOPY , RIGHT URETERAL STENT REMOVAL (Right)  Patient Location: PACU  Anesthesia Type:General  Level of Consciousness:  sedated, patient cooperative and responds to stimulation  Airway & Oxygen Therapy:Patient Spontanous Breathing and Patient connected to face mask oxgen  Post-op Assessment:  Report given to PACU RN and Post -op Vital signs reviewed and stable  Post vital signs:  Reviewed and stable  Last Vitals:  Vitals:   08/21/23 1225 08/21/23 1424  BP: 129/78 113/63  Pulse: 66 76  Resp: 15 10  Temp: 36.7 C   SpO2: 96% 94%    Complications: No apparent anesthesia complications

## 2023-08-23 ENCOUNTER — Encounter (HOSPITAL_COMMUNITY): Payer: Self-pay | Admitting: Urology

## 2023-08-23 DIAGNOSIS — R8271 Bacteriuria: Secondary | ICD-10-CM | POA: Diagnosis not present

## 2023-08-23 DIAGNOSIS — R3914 Feeling of incomplete bladder emptying: Secondary | ICD-10-CM | POA: Diagnosis not present

## 2023-08-23 DIAGNOSIS — R102 Pelvic and perineal pain: Secondary | ICD-10-CM | POA: Diagnosis not present

## 2023-08-23 NOTE — Anesthesia Postprocedure Evaluation (Signed)
 Anesthesia Post Note  Patient: [name]  Procedure(s) Performed: CYSTOSCOPY , RIGHT URETERAL STENT REMOVAL (Right: Bladder)     Patient location during evaluation: PACU Anesthesia Type: General Level of consciousness: awake and alert, oriented and patient cooperative Pain management: pain level controlled Vital Signs Assessment: post-procedure vital signs reviewed and stable Respiratory status: spontaneous breathing, nonlabored ventilation and respiratory function stable Cardiovascular status: blood pressure returned to baseline and stable Postop Assessment: no apparent nausea or vomiting Anesthetic complications: no   No notable events documented.  Last Vitals:  Vitals:   08/21/23 1515 08/21/23 1527  BP: 125/74 (!) 142/74  Pulse: 72 72  Resp: 12   Temp: 36.4 C   SpO2: 94% 99%    Last Pain:  Vitals:   08/21/23 1527  TempSrc:   PainSc: 0-No pain                 Almarie CHRISTELLA Marchi

## 2023-08-24 ENCOUNTER — Encounter (HOSPITAL_COMMUNITY): Payer: Self-pay | Admitting: Urology

## 2023-08-27 DIAGNOSIS — R3914 Feeling of incomplete bladder emptying: Secondary | ICD-10-CM | POA: Diagnosis not present

## 2023-08-27 DIAGNOSIS — N401 Enlarged prostate with lower urinary tract symptoms: Secondary | ICD-10-CM | POA: Diagnosis not present

## 2023-09-02 DIAGNOSIS — G4733 Obstructive sleep apnea (adult) (pediatric): Secondary | ICD-10-CM | POA: Diagnosis not present

## 2023-09-05 DIAGNOSIS — K080 Exfoliation of teeth due to systemic causes: Secondary | ICD-10-CM | POA: Diagnosis not present

## 2023-09-11 DIAGNOSIS — R8271 Bacteriuria: Secondary | ICD-10-CM | POA: Diagnosis not present

## 2023-09-11 DIAGNOSIS — R3914 Feeling of incomplete bladder emptying: Secondary | ICD-10-CM | POA: Diagnosis not present

## 2023-09-14 DIAGNOSIS — E785 Hyperlipidemia, unspecified: Secondary | ICD-10-CM | POA: Diagnosis not present

## 2023-09-14 DIAGNOSIS — I10 Essential (primary) hypertension: Secondary | ICD-10-CM | POA: Diagnosis not present

## 2023-09-14 DIAGNOSIS — Z0000 Encounter for general adult medical examination without abnormal findings: Secondary | ICD-10-CM | POA: Diagnosis not present

## 2023-10-03 DIAGNOSIS — G4733 Obstructive sleep apnea (adult) (pediatric): Secondary | ICD-10-CM | POA: Diagnosis not present

## 2023-10-10 DIAGNOSIS — M542 Cervicalgia: Secondary | ICD-10-CM | POA: Diagnosis not present

## 2023-10-10 DIAGNOSIS — M7542 Impingement syndrome of left shoulder: Secondary | ICD-10-CM | POA: Diagnosis not present

## 2023-11-02 DIAGNOSIS — R8271 Bacteriuria: Secondary | ICD-10-CM | POA: Diagnosis not present

## 2023-11-02 DIAGNOSIS — R3914 Feeling of incomplete bladder emptying: Secondary | ICD-10-CM | POA: Diagnosis not present

## 2023-11-07 DIAGNOSIS — M7542 Impingement syndrome of left shoulder: Secondary | ICD-10-CM | POA: Diagnosis not present

## 2023-12-03 DIAGNOSIS — L821 Other seborrheic keratosis: Secondary | ICD-10-CM | POA: Diagnosis not present

## 2023-12-03 DIAGNOSIS — L817 Pigmented purpuric dermatosis: Secondary | ICD-10-CM | POA: Diagnosis not present

## 2023-12-03 DIAGNOSIS — D225 Melanocytic nevi of trunk: Secondary | ICD-10-CM | POA: Diagnosis not present

## 2023-12-03 DIAGNOSIS — L814 Other melanin hyperpigmentation: Secondary | ICD-10-CM | POA: Diagnosis not present

## 2023-12-14 ENCOUNTER — Other Ambulatory Visit: Payer: Self-pay | Admitting: Urology

## 2023-12-14 DIAGNOSIS — R3916 Straining to void: Secondary | ICD-10-CM | POA: Diagnosis not present

## 2023-12-14 DIAGNOSIS — N401 Enlarged prostate with lower urinary tract symptoms: Secondary | ICD-10-CM | POA: Diagnosis not present

## 2023-12-14 DIAGNOSIS — R3912 Poor urinary stream: Secondary | ICD-10-CM | POA: Diagnosis not present

## 2023-12-14 DIAGNOSIS — R102 Pelvic and perineal pain: Secondary | ICD-10-CM | POA: Diagnosis not present

## 2023-12-20 DIAGNOSIS — G610 Guillain-Barre syndrome: Secondary | ICD-10-CM | POA: Diagnosis not present

## 2023-12-20 DIAGNOSIS — R300 Dysuria: Secondary | ICD-10-CM | POA: Diagnosis not present

## 2023-12-20 DIAGNOSIS — G4733 Obstructive sleep apnea (adult) (pediatric): Secondary | ICD-10-CM | POA: Diagnosis not present

## 2023-12-22 NOTE — Patient Instructions (Signed)
 SURGICAL WAITING ROOM VISITATION Patients having surgery or a procedure may have no more than 2 support people in the waiting area - these visitors may rotate in the visitor waiting room.   If the patient needs to stay at the hospital during part of their recovery, the visitor guidelines for inpatient rooms apply.  PRE-OP VISITATION  Pre-op nurse will coordinate an appropriate time for 1 support person to accompany the patient in pre-op.  This support person may not rotate.  This visitor will be contacted when the time is appropriate for the visitor to come back in the pre-op area.  Please refer to the [facility] website for the visitor guidelines for Inpatients (after your surgery is over and you are in a regular room).  You are not required to quarantine at this time prior to your surgery. However, you must do this: Hand Hygiene often Do NOT share personal items Notify your provider if you are in close contact with someone who has COVID or you develop fever 100.4 or greater, new onset of sneezing, cough, sore throat, shortness of breath or body aches.  If you test positive for Covid or have been in contact with anyone that has tested positive in the last 10 days please notify you surgeon.    Your procedure is scheduled on:  Monday  Dec 31, 2023  Report to [facility] Main Entrance: [address] entrance where the [employer] is available.   Report to admitting at: 09:45 AM  Call this number if you have any questions or problems the morning of surgery [phone]  DO NOT EAT OR DRINK ANYTHING AFTER MIDNIGHT THE NIGHT PRIOR TO YOUR SURGERY / PROCEDURE.   FOLLOW  ANY ADDITIONAL PRE OP INSTRUCTIONS YOU RECEIVED FROM YOUR SURGEON'S OFFICE!!!   Oral Hygiene is also important to reduce your risk of infection.        Remember - BRUSH YOUR TEETH THE MORNING OF SURGERY WITH YOUR REGULAR TOOTHPASTE  Do NOT smoke after Midnight the night before surgery.  STOP TAKING all Vitamins,  Herbs and supplements 1 week before your surgery.   Take ONLY these medicines the morning of surgery with A SIP OF WATER: Tamsulosin , finasteride, Amlodipine   If You have been diagnosed with Sleep Apnea - Bring CPAP mask and tubing day of surgery. We will provide you with a CPAP machine on the day of your surgery.                   You may not have any metal on your body including jewelry, and body piercing  Do not wear  lotions, powders, cologne, or deodorant  Men may shave face and neck.  Contacts, Hearing Aids, dentures or bridgework may not be worn into surgery. DENTURES WILL BE REMOVED PRIOR TO SURGERY PLEASE DO NOT APPLY "Poly grip" OR ADHESIVES!!!  Patients discharged on the day of surgery will not be allowed to drive home.  Someone NEEDS to stay with you for the first 24 hours after anesthesia.  Do not bring your home medications to the hospital. The Pharmacy will dispense medications listed on your medication list to you during your admission in the Hospital.  Please read over the following fact sheets you were given: IF YOU HAVE QUESTIONS ABOUT YOUR PRE-OP INSTRUCTIONS, PLEASE CALL [phone].   [facility] - Preparing for Surgery Before surgery, you can play an important role.  Because skin is not sterile, your skin needs to be as free of germs as possible.  You can reduce the number of germs on your skin by washing with CHG (chlorahexidine gluconate) soap before surgery.  CHG is an antiseptic cleaner which kills germs and bonds with the skin to continue killing germs even after washing. Please DO NOT use if you have an allergy  to CHG or antibacterial soaps.  If your skin becomes reddened/irritated stop using the CHG and inform your nurse when you arrive at Short Stay. Do not shave (including legs and underarms) for at least 48 hours prior to the first CHG shower.  You may shave your face/neck.  Please follow these instructions carefully:  1.  Shower with CHG Soap the night  before surgery and the  morning of surgery.  2.  If you choose to wash your hair, wash your hair first as usual with your normal  shampoo.  3.  After you shampoo, rinse your hair and body thoroughly to remove the shampoo.                             4.  Use CHG as you would any other liquid soap.  You can apply chg directly to the skin and wash.  Gently with a scrungie or clean washcloth.  5.  Apply the CHG Soap to your body ONLY FROM THE NECK DOWN.   Do not use on face/ open                           Wound or open sores. Avoid contact with eyes, ears mouth and genitals (private parts).                       Wash face,  Genitals (private parts) with your normal soap.             6.  Wash thoroughly, paying special attention to the area where your  surgery  will be performed.  7.  Thoroughly rinse your body with warm water from the neck down.  8.  DO NOT shower/wash with your normal soap after using and rinsing off the CHG Soap.            9.  Pat yourself dry with a clean towel.            10.  Wear clean pajamas.            11.  Place clean sheets on your bed the night of your first shower and do not  sleep with pets.  ON THE DAY OF SURGERY : Do not apply any lotions/deodorants the morning of surgery.  Please wear clean clothes to the hospital/surgery center.    FAILURE TO FOLLOW THESE INSTRUCTIONS MAY RESULT IN THE CANCELLATION OF YOUR SURGERY  PATIENT SIGNATURE_________________________________  NURSE SIGNATURE__________________________________  ________________________________________________________________________

## 2023-12-22 NOTE — Progress Notes (Signed)
 COVID Vaccine received:  []  No [x]  Yes Date of any COVID positive Test in last 90 days: none   PCP - Ronna Coho, MD [phone] (Work)  [phone] (Fax)  Cardiologist - none   Chest x-ray - 01-18-2021   2v  Epic EKG - 08-07-2023  Epic Stress Test -  ECHO -  Cardiac Cath -    Pacemaker / ICD device [x]  No []  Yes   Spinal Cord Stimulator:[x]  No []  Yes       History of Sleep Apnea? []  No [x]  Yes   CPAP used?- []  No [x]  Yes     Does the patient monitor blood sugar?   [x]  N/A   []  No []  Yes  Patient has: [x]  NO Hx DM   []  Pre-DM   []  DM1  []   DM2   Blood Thinner / Instructions: None Aspirin Instructions: none   ERAS Protocol Ordered: [x]  No  []  Yes Patient is to be NPO after: midnight prior   Dental hx: []  Dentures:  [x]  N/A      []  Bridge or Partial:                   []  Loose or Damaged teeth:    Activity level: Patient is able climb a flight of stairs without difficulty; [x]  No CP  [x]  No SOB.  Patient can perform ADLs without assistance.    Anesthesia review: Guillain Lorrain Rosenthal., Has Alpha Gal, HTN, OSA-CPAP, some days cigar smoker   Patient denies shortness of breath, fever, cough and chest pain at PAT appointment.   Patient verbalized understanding and agreement to the Pre-Surgical Instructions that were given to them at this PAT appointment. Patient was also educated of the need to review these PAT instructions again prior to his surgery.I reviewed the appropriate phone numbers to call if they have any and questions or concerns.

## 2023-12-24 ENCOUNTER — Other Ambulatory Visit: Payer: Self-pay

## 2023-12-24 ENCOUNTER — Encounter (HOSPITAL_COMMUNITY)
Admission: RE | Admit: 2023-12-24 | Discharge: 2023-12-24 | Disposition: A | Discharge status: Home / Self Care | Source: Ambulatory Visit | Attending: Urology | Admitting: Urology

## 2023-12-24 ENCOUNTER — Encounter (HOSPITAL_COMMUNITY): Payer: Self-pay

## 2023-12-24 VITALS — BP 125/77 | HR 77 | Temp 98.8°F | Resp 16 | Ht 68.0 in | Wt 222.0 lb

## 2023-12-24 DIAGNOSIS — Z01812 Encounter for preprocedural laboratory examination: Secondary | ICD-10-CM | POA: Insufficient documentation

## 2023-12-24 DIAGNOSIS — Z01818 Encounter for other preprocedural examination: Secondary | ICD-10-CM | POA: Diagnosis not present

## 2023-12-24 DIAGNOSIS — I10 Essential (primary) hypertension: Secondary | ICD-10-CM | POA: Insufficient documentation

## 2023-12-24 HISTORY — DX: Unspecified osteoarthritis, unspecified site: M19.90

## 2023-12-24 LAB — CBC
HCT: 43.2 % (ref 39.0–52.0)
Hemoglobin: 13.6 g/dL (ref 13.0–17.0)
MCH: 27.5 pg (ref 26.0–34.0)
MCHC: 31.5 g/dL (ref 30.0–36.0)
MCV: 87.3 fL (ref 80.0–100.0)
Platelets: 231 10*3/uL (ref 150–400)
RBC: 4.95 MIL/uL (ref 4.22–5.81)
RDW: 13 % (ref 11.5–15.5)
WBC: 10 10*3/uL (ref 4.0–10.5)
nRBC: 0 % (ref 0.0–0.2)

## 2023-12-24 LAB — BASIC METABOLIC PANEL WITH GFR
Anion gap: 9 (ref 5–15)
BUN: 14 mg/dL (ref 8–23)
CO2: 21 mmol/L — ABNORMAL LOW (ref 22–32)
Calcium: 9.3 mg/dL (ref 8.9–10.3)
Chloride: 109 mmol/L (ref 98–111)
Creatinine, Ser: 0.71 mg/dL (ref 0.61–1.24)
GFR, Estimated: >60 mL/min (ref >60)
Glucose, Bld: 115 mg/dL — ABNORMAL HIGH (ref 70–99)
Potassium: 3.7 mmol/L (ref 3.5–5.1)
Sodium: 139 mmol/L (ref 135–145)

## 2023-12-27 ENCOUNTER — Other Ambulatory Visit (HOSPITAL_COMMUNITY): Payer: Self-pay

## 2023-12-28 DIAGNOSIS — M7989 Other specified soft tissue disorders: Secondary | ICD-10-CM | POA: Diagnosis not present

## 2023-12-28 DIAGNOSIS — R399 Unspecified symptoms and signs involving the genitourinary system: Secondary | ICD-10-CM | POA: Diagnosis not present

## 2023-12-31 ENCOUNTER — Encounter (HOSPITAL_COMMUNITY): Payer: Self-pay | Admitting: Urology

## 2023-12-31 ENCOUNTER — Ambulatory Visit (HOSPITAL_COMMUNITY)
Admission: RE | Admit: 2023-12-31 | Discharge: 2023-12-31 | Disposition: A | Discharge status: Home / Self Care | Attending: Urology | Admitting: Urology

## 2023-12-31 ENCOUNTER — Ambulatory Visit (HOSPITAL_COMMUNITY): Admitting: Anesthesiology

## 2023-12-31 ENCOUNTER — Encounter (HOSPITAL_COMMUNITY)
Admission: RE | Disposition: A | Discharge status: Home / Self Care | Payer: Self-pay | Source: Home / Self Care | Attending: Urology

## 2023-12-31 DIAGNOSIS — N35919 Unspecified urethral stricture, male, unspecified site: Secondary | ICD-10-CM | POA: Diagnosis not present

## 2023-12-31 DIAGNOSIS — Z87442 Personal history of urinary calculi: Secondary | ICD-10-CM | POA: Insufficient documentation

## 2023-12-31 DIAGNOSIS — F17200 Nicotine dependence, unspecified, uncomplicated: Secondary | ICD-10-CM | POA: Insufficient documentation

## 2023-12-31 DIAGNOSIS — Z79899 Other long term (current) drug therapy: Secondary | ICD-10-CM | POA: Diagnosis not present

## 2023-12-31 DIAGNOSIS — R3916 Straining to void: Secondary | ICD-10-CM | POA: Diagnosis not present

## 2023-12-31 DIAGNOSIS — R3913 Splitting of urinary stream: Secondary | ICD-10-CM | POA: Insufficient documentation

## 2023-12-31 DIAGNOSIS — M1990 Unspecified osteoarthritis, unspecified site: Secondary | ICD-10-CM | POA: Diagnosis not present

## 2023-12-31 DIAGNOSIS — N401 Enlarged prostate with lower urinary tract symptoms: Secondary | ICD-10-CM | POA: Insufficient documentation

## 2023-12-31 DIAGNOSIS — G709 Myoneural disorder, unspecified: Secondary | ICD-10-CM | POA: Diagnosis not present

## 2023-12-31 DIAGNOSIS — K219 Gastro-esophageal reflux disease without esophagitis: Secondary | ICD-10-CM | POA: Insufficient documentation

## 2023-12-31 DIAGNOSIS — R3912 Poor urinary stream: Secondary | ICD-10-CM | POA: Diagnosis not present

## 2023-12-31 DIAGNOSIS — G4730 Sleep apnea, unspecified: Secondary | ICD-10-CM | POA: Insufficient documentation

## 2023-12-31 DIAGNOSIS — Z8669 Personal history of other diseases of the nervous system and sense organs: Secondary | ICD-10-CM | POA: Diagnosis not present

## 2023-12-31 DIAGNOSIS — I10 Essential (primary) hypertension: Secondary | ICD-10-CM | POA: Insufficient documentation

## 2023-12-31 DIAGNOSIS — N35811 Other urethral stricture, male, meatal: Secondary | ICD-10-CM | POA: Diagnosis not present

## 2023-12-31 DIAGNOSIS — Z9079 Acquired absence of other genital organ(s): Secondary | ICD-10-CM | POA: Diagnosis not present

## 2023-12-31 HISTORY — PX: CYSTOSCOPY WITH URETHRAL DILATATION: SHX5125

## 2023-12-31 SURGERY — CYSTOSCOPY, WITH URETHRAL DILATION
Anesthesia: General

## 2023-12-31 MED ORDER — ONDANSETRON HCL 4 MG/2ML IJ SOLN: 4.0000 mg | Freq: Once | INTRAMUSCULAR | Status: DC | PRN

## 2023-12-31 MED ORDER — PROPOFOL 10 MG/ML IV BOLUS
INTRAVENOUS | Status: DC | PRN
  Administered 2023-12-31: 30 mg via INTRAVENOUS
  Administered 2023-12-31: 170 mg via INTRAVENOUS

## 2023-12-31 MED ORDER — CEFAZOLIN SODIUM-DEXTROSE 2-4 GM/100ML-% IV SOLN
2.0000 g | INTRAVENOUS | Status: AC
  Administered 2023-12-31: 2 g via INTRAVENOUS
  Filled 2023-12-31: qty 100

## 2023-12-31 MED ORDER — PROPOFOL 10 MG/ML IV BOLUS
INTRAVENOUS | Status: AC
  Filled 2023-12-31: qty 20

## 2023-12-31 MED ORDER — LACTATED RINGERS IV SOLN: INTRAVENOUS | Status: DC

## 2023-12-31 MED ORDER — ONDANSETRON HCL 4 MG/2ML IJ SOLN
INTRAMUSCULAR | Status: DC | PRN
  Administered 2023-12-31: 4 mg via INTRAVENOUS

## 2023-12-31 MED ORDER — LIDOCAINE HCL (PF) 2 % IJ SOLN
INTRAMUSCULAR | Status: AC
  Filled 2023-12-31: qty 5

## 2023-12-31 MED ORDER — HYDROCODONE-ACETAMINOPHEN 5-325 MG PO TABS: 1.0000 | ORAL_TABLET | Freq: Four times a day (QID) | ORAL | 0 refills | Status: DC | PRN

## 2023-12-31 MED ORDER — MIDAZOLAM HCL 2 MG/2ML IJ SOLN
INTRAMUSCULAR | Status: AC
  Filled 2023-12-31: qty 2

## 2023-12-31 MED ORDER — ORAL CARE MOUTH RINSE: 15.0000 mL | Freq: Once | OROMUCOSAL | Status: AC

## 2023-12-31 MED ORDER — ONDANSETRON HCL 4 MG/2ML IJ SOLN
INTRAMUSCULAR | Status: AC
  Filled 2023-12-31: qty 2

## 2023-12-31 MED ORDER — FENTANYL CITRATE (PF) 100 MCG/2ML IJ SOLN
INTRAMUSCULAR | Status: AC
  Filled 2023-12-31: qty 2

## 2023-12-31 MED ORDER — SODIUM CHLORIDE 0.9 % IR SOLN
Status: DC | PRN
  Administered 2023-12-31: 3000 mL

## 2023-12-31 MED ORDER — AMOXICILLIN-POT CLAVULANATE 875-125 MG PO TABS: 1.0000 | ORAL_TABLET | Freq: Two times a day (BID) | ORAL | 0 refills | Status: DC

## 2023-12-31 MED ORDER — 0.9 % SODIUM CHLORIDE (POUR BTL) OPTIME
TOPICAL | Status: DC | PRN
  Administered 2023-12-31: 1000 mL

## 2023-12-31 MED ORDER — HYDROMORPHONE HCL 1 MG/ML IJ SOLN
INTRAMUSCULAR | Status: AC
  Filled 2023-12-31: qty 1

## 2023-12-31 MED ORDER — DEXAMETHASONE SODIUM PHOSPHATE 10 MG/ML IJ SOLN
INTRAMUSCULAR | Status: DC | PRN
  Administered 2023-12-31: 8 mg via INTRAVENOUS

## 2023-12-31 MED ORDER — LACTATED RINGERS IV SOLN: INTRAVENOUS | Status: DC | PRN

## 2023-12-31 MED ORDER — HYDROMORPHONE HCL 1 MG/ML IJ SOLN
0.2500 mg | INTRAMUSCULAR | Status: DC | PRN
  Administered 2023-12-31: 0.5 mg via INTRAVENOUS

## 2023-12-31 MED ORDER — DEXAMETHASONE SODIUM PHOSPHATE 10 MG/ML IJ SOLN
INTRAMUSCULAR | Status: AC
  Filled 2023-12-31: qty 1

## 2023-12-31 MED ORDER — CHLORHEXIDINE GLUCONATE 0.12 % MT SOLN
15.0000 mL | Freq: Once | OROMUCOSAL | Status: AC
  Administered 2023-12-31: 15 mL via OROMUCOSAL

## 2023-12-31 MED ORDER — FENTANYL CITRATE (PF) 100 MCG/2ML IJ SOLN
INTRAMUSCULAR | Status: DC | PRN
  Administered 2023-12-31 (×2): 50 ug via INTRAVENOUS

## 2023-12-31 MED ORDER — LIDOCAINE HCL (CARDIAC) PF 100 MG/5ML IV SOSY
PREFILLED_SYRINGE | INTRAVENOUS | Status: DC | PRN
  Administered 2023-12-31: 100 mg via INTRAVENOUS

## 2023-12-31 MED ORDER — DEXAMETHASONE SODIUM PHOSPHATE 4 MG/ML IJ SOLN: INTRAMUSCULAR | Status: DC | PRN

## 2023-12-31 MED ORDER — OXYCODONE HCL 5 MG/5ML PO SOLN: 5.0000 mg | Freq: Once | ORAL | Status: DC | PRN

## 2023-12-31 MED ORDER — OXYCODONE HCL 5 MG PO TABS: 5.0000 mg | ORAL_TABLET | Freq: Once | ORAL | Status: DC | PRN

## 2023-12-31 MED ORDER — MIDAZOLAM HCL 5 MG/5ML IJ SOLN
INTRAMUSCULAR | Status: DC | PRN
  Administered 2023-12-31: 2 mg via INTRAVENOUS

## 2023-12-31 SURGICAL SUPPLY — 18 items
BAG URINE DRAIN 2000ML AR STRL (UROLOGICAL SUPPLIES) ×1 IMPLANT
BALLOON NEPHROSTOMY (BALLOONS) IMPLANT
CATH BALLOON NEPH ULTRXX 8X125 (BALLOONS) IMPLANT
CATH FOLEY 2WAY SLVR 5CC 18FR (CATHETERS) IMPLANT
CATH URET 5FR 70CM CONE TIP (BALLOONS) IMPLANT
CATH URETL OPEN END 6FR 70 (CATHETERS) IMPLANT
CLOTH BEACON ORANGE TIMEOUT ST (SAFETY) ×1 IMPLANT
GLOVE BIO SURGEON STRL SZ7.5 (GLOVE) ×1 IMPLANT
GOWN STRL REUS W/ TWL XL LVL3 (GOWN DISPOSABLE) ×1 IMPLANT
GUIDEWIRE ANG ZIPWIRE 038X150 (WIRE) IMPLANT
GUIDEWIRE STR DUAL SENSOR (WIRE) ×1 IMPLANT
KIT TURNOVER KIT A (KITS) IMPLANT
MANIFOLD NEPTUNE II (INSTRUMENTS) IMPLANT
NS IRRIG 1000ML POUR BTL (IV SOLUTION) IMPLANT
PACK CYSTO (CUSTOM PROCEDURE TRAY) ×1 IMPLANT
PENCIL SMOKE EVACUATOR (MISCELLANEOUS) IMPLANT
TUBING UROLOGY SET (TUBING) IMPLANT
WATER STERILE IRR 3000ML UROMA (IV SOLUTION) ×1 IMPLANT

## 2023-12-31 NOTE — Interval H&P Note (Signed)
 History and Physical Interval Note:  12/31/2023 12:09 PM  Steven Steele  has presented today for surgery, with the diagnosis of LOWER URINARY TRACT SYMPTOMS.  The various methods of treatment have been discussed with the patient and family. After consideration of risks, benefits and other options for treatment, the patient has consented to  Procedure(s): CYSTOSCOPY, WITH URETHRAL DILATION (N/A) CYSTOSCOPY, WITH DIRECT VISION INTERNAL URETHROTOMY (N/A) as a surgical intervention.  The patient's history has been reviewed, patient examined, no change in status, stable for surgery.  I have reviewed the patient's chart and labs.  Questions were answered to the patient's satisfaction.     Maralyn Sender, III

## 2023-12-31 NOTE — Anesthesia Procedure Notes (Signed)
 Procedure Name: LMA Insertion Date/Time: 12/31/2023 12:37 PM  Performed by: Josetta Niece, CRNAPre-anesthesia Checklist: Patient identified, Emergency Drugs available, Suction available and Patient being monitored Patient Re-evaluated:Patient Re-evaluated prior to induction Oxygen Delivery Method: Circle System Utilized Preoxygenation: Pre-oxygenation with 100% oxygen Induction Type: IV induction Ventilation: Mask ventilation without difficulty LMA: LMA inserted LMA Size: 4.0 Number of attempts: 1 Airway Equipment and Method: Bite block Placement Confirmation: positive ETCO2 Tube secured with: Tape Dental Injury: Teeth and Oropharynx as per pre-operative assessment

## 2023-12-31 NOTE — Transfer of Care (Signed)
 Immediate Anesthesia Transfer of Care Note  Patient: Steven Steele  Procedure(s) Performed: CYSTOSCOPY, WITH URETHRAL DILATION  Patient Location: PACU  Anesthesia Type:General  Level of Consciousness: awake and alert   Airway & Oxygen Therapy: Patient Spontanous Breathing and Patient connected to face mask oxygen  Post-op Assessment: Report given to RN and Post -op Vital signs reviewed and stable  Post vital signs: Reviewed and stable  Last Vitals:  Vitals Value Taken Time  BP 126/78 12/31/23 1312  Temp    Pulse 69 12/31/23 1314  Resp 10 12/31/23 1314  SpO2 95 % 12/31/23 1314  Vitals shown include unfiled device data.  Last Pain:  Vitals:   12/31/23 1034  TempSrc:   PainSc: 2          Complications: No notable events documented.

## 2023-12-31 NOTE — Anesthesia Preprocedure Evaluation (Signed)
 Anesthesia Evaluation  Patient identified by MRN, date of birth, ID band Patient awake    Reviewed: Allergy  & Precautions, NPO status , Patient's Chart, lab work & pertinent test results  Airway Mallampati: II  TM Distance: >3 FB     Dental no notable dental hx. (+) Dental Advisory Given, Caps   Pulmonary sleep apnea and Continuous Positive Airway Pressure Ventilation , Current Smoker and Patient abstained from smoking.   Pulmonary exam normal breath sounds clear to auscultation       Cardiovascular hypertension, Pt. on medications Normal cardiovascular exam Rhythm:Regular Rate:Normal     Neuro/Psych Hx/o Guliian-Barre syndrome 2017 trated with gamma globulin resolved after 1 week  Neuromuscular disease  negative psych ROS   GI/Hepatic Neg liver ROS,GERD  ,,  Endo/Other  negative endocrine ROS    Renal/GU Renal diseaseHx/o renal calculi- may currently have a renal calculus   Urethral stricture    Musculoskeletal  (+) Arthritis , Osteoarthritis,    Abdominal  (+) + obese  Peds  Hematology negative hematology ROS (+)   Anesthesia Other Findings   Reproductive/Obstetrics                              Anesthesia Physical Anesthesia Plan  ASA: 2  Anesthesia Plan: General   Post-op Pain Management: Minimal or no pain anticipated   Induction: Intravenous  PONV Risk Score and Plan: 2 and Treatment may vary due to age or medical condition and Ondansetron   Airway Management Planned: LMA  Additional Equipment: None  Intra-op Plan:   Post-operative Plan: Extubation in OR  Informed Consent: I have reviewed the patients History and Physical, chart, labs and discussed the procedure including the risks, benefits and alternatives for the proposed anesthesia with the patient or authorized representative who has indicated his/her understanding and acceptance.     Dental advisory  given  Plan Discussed with: CRNA and Anesthesiologist  Anesthesia Plan Comments:          Anesthesia Quick Evaluation

## 2023-12-31 NOTE — Op Note (Signed)
 Operative Note  Preoperative diagnosis:  1.  Lower urinary tract symptoms  Postoperative diagnosis: 1.  Pendulous urethral stricture  Procedure(s): 1.  Cystoscopy with balloon dilation of urethral stricture  Surgeon: Leila Punt, MD  Assistants: None  Anesthesia: General  Complications: None immediate  EBL: Minimal  Specimens: 1.  None  Drains/Catheters: 1.  18 French Foley catheter  Intraoperative findings: He had mild meatal stenosis.  Within the pendulous urethra he had 3 short dense urethral strictures able to be transversed with the semirigid ureteroscope.  Wide open channel after dilation to 24 [place] with a balloon dilation.  Evidence of prior resection of the prostate.  Open channel there.  No bladder neck contracture.  Bladder mucosa without any tumors or stones.  Indication: 63 year old male status post TURP who developed severe lower urinary tract symptoms after initially voiding well unable to tolerate office cystoscopy presents for the previously mentioned operation.  Description of procedure:  The patient was identified and consent was obtained.  The patient was taken to the operating room and placed in the supine position.  The patient was placed under general anesthesia.  Perioperative antibiotics were administered.  The patient was placed in dorsal lithotomy.  Patient was prepped and draped in a standard sterile fashion and a timeout was performed.  The 21 French rigid cystoscope was not able to be advanced into the urethra.  I therefore advanced a semirigid ureteroscope into the urethra.  He had very mild meatal stenosis.  I noted 3 strictures conglomerated together but short and dense within the pendulous urethra.  I was able to transversed these with the semirigid ureteroscope though it was a little bit tight.  I advanced into the bladder and passed a wire through the semirigid ureteroscope into the bladder.  I withdrew the scope.  I advanced a 15 cm 24 French  balloon over the wire and inflated to a pressure of 12.  This remained inflated for approximately 5 minutes and then the balloon was deflated.  I then was able to perform a rigid cystoscopy with the 21 French rigid cystoscope.  Advanced into the urethra and into the bladder.  Complete cystoscopy was performed and there were no tumors or stones.  I withdrew the scope and the wire and placed an 18 French Foley catheter.  This concluded the operation.  Patient tolerated the procedure well was stable postoperatively.  Plan: He will return in 1 week for voiding trial

## 2023-12-31 NOTE — Anesthesia Postprocedure Evaluation (Signed)
 Anesthesia Post Note  Patient: Steven Steele  Procedure(s) Performed: CYSTOSCOPY, WITH URETHRAL DILATION     Patient location during evaluation: PACU Anesthesia Type: General Level of consciousness: awake and alert and oriented Pain management: pain level controlled Vital Signs Assessment: post-procedure vital signs reviewed and stable Respiratory status: spontaneous breathing, nonlabored ventilation and respiratory function stable Cardiovascular status: blood pressure returned to baseline and stable Postop Assessment: no apparent nausea or vomiting Anesthetic complications: no   No notable events documented.  Last Vitals:  Vitals:   12/31/23 1430 12/31/23 1445  BP: 116/74 118/74  Pulse: (!) 57 (!) 52  Resp: (!) 4 15  Temp:    SpO2: 93% 96%    Last Pain:  Vitals:   12/31/23 1359  TempSrc:   PainSc: 6                  [name] A.

## 2023-12-31 NOTE — H&P (Signed)
 CC/HPI: 03/19/2023  Patient underwent PSA screening on 09/09/2022 that showed a PSA of 4.25. He has had elevated PSA before that has come back within normal range. Never had a biopsy. Denies family history of prostate cancer. Has not had a recheck since January. Denies any hematuria or dysuria. Does have bothersome voiding complaints but no interest in treatment for these at this time.   05/23/2023  Repeat PSA was 5.15. He elected undergo MRI of the prostate. He elected against proceeding with a prostate biopsy. MRI showed a prostate size of 83 g without any high-grade lesions. He does complain about voiding symptoms including sensation of incomplete bladder emptying, frequency, intermittency, urgency, weak stream, straining to void, nocturia x 1. Not on any medication for his prostate. His brother had a TURP.   07/12/2023  Patient underwent a UroCuff that revealed the following:  UroCuff Summary  Patient's desire to void was 8 on a scale of 1 to 10. Patient voided 117 mL. The maximum flow (Qmax) was 5.4 mL/s and highest pressure at flow interruption (PcuffInt) was 198.9 cm H2O. Abdominal and perianal EMG was measured during the study. The calculated pressure-flow study (PFS) score was 25%. An automatic analysis algorithm calculated the pressure/flow point based on the inflations in the study. When plotted onto the modified nomogram this calculated pressure/flow point lies in the nomogram quadrant that suggests a finding of Obstructed.   In the interval, he also underwent a CT scan of his abdomen and pelvis on 06/27/2023 that showed a proximal right 4 mm ureteral calculus with mild right hydronephrosis. Urinalysis negative but he is having some intermittent dysuria and occasional flank pain. Pain is not severe. Is very mild.   Continues with significant voiding complaints including intermittency, urgency, weak stream, straining to void, nocturia x 3, sensation of incomplete bladder emptying. PVR was 260  after his UroCuff study.   07/27/2023: Steven Steele is a 63 year old man who presents today for follow-up of a right proximal ureteral stone measuring approximately 4 mm. He had sudden and severe pain 2 days ago but this subsided quickly. He has not seen a stone pass. His KUB shows persistent sided 4 mm opacity around the level of L2. Renal ultrasound shows persistent hydronephrosis. He also continues to have significant voiding complaints and would like to proceed with TURP at the same time is undergoing a ureteroscopy. He denies fevers, chills. He is only using Tylenol  for pain. He remains on tamsulosin  and finasteride.   08/21/23: Patient underwent right ureteroscopy with laser lithotripsy and stone extraction and TURP. He has Guillain-Barr and is very sensitive to pain and discomfort. Had a lot of trouble with catheter over the weekend and came in today for voiding trial. Continues to have severe pain with the stent. Also having some clot per urethra.   08/23/23: Patient returns today with difficulty voiding and urethral pain, he appears in distress today. He underwent TURP and right URS with stone extraction on 12/27 with Dr. Parke Boll. He was seen on 12/31 for TOV and was also experiencing severe stent pain and was taken to the OR for stent removal that day. He was able to void with a strong stream for the first few hours following the procedure. He notes in the past day he has had a weak stream with small volumes each time. He has also been experiencing gross hematuria, dysuria, and frequency/urgency. Denies passage of clots. He denies flank pain, fever/chills, nausea/vomiting. He has been attempting symptom management with oxybutynin  TID, oxycodone   q4 hours, AZO PRN. He continues on tamsulosin  0.4 mg and finasteride 5 mg daily. PVR 158 cc.   08/27/2023: Seen back for f/u exam. He has seen improvement with FOS following increasing tamsulosin  to twice daily dosing. Still passing some blood clots and hematuria  but thinks that has improved. Still with some considerable amount of pelvic and bladder pain as well as burning with urination. He has been using AZO and daily oxybutynin . He also remains on finasteride. Denies constipation, he is taking over-the-counter stool softeners. Also no interval fevers/chills or nausea/vomiting. Urine culture from last week was negative for bacterial growth but UA appears much more infected today. PVR also increased to 318 mL.   09/11/2023: Patient returns today for follow-up. He reports he discontinued all medications except for tamsulosin  and finasteride due to various side effects including leg swelling, nausea/vomiting, rash. Course of doxycycline was not completed. He continues with bladder spasms and dysuria. He is approximately 4 weeks out from his TURP. He reports an improvement in frequency. He continues with urgency and associated incontinence. He has also experienced retrograde ejaculation. Denies gross hematuria, flank pain, abdominal pain, fever/chills, nausea/vomiting. UA today continues with microscopic abnormalities. PVR 73 cc.   11/02/23: Steven Steele returns today for voiding concerns. He underwent TURP in December and was found to have extensive urethral irritation and bleeding at that time. He notes over the past week his stream has become weaker and spraying or split streams. He denies the inability to void, straining to urinate, gross hematuria, dysuria, fever/chills. PVR 15 cc.   12/14/2023  Patient continues to have significant weak stream, spraying of the stream, pain with urination. Has Guillain-Barr syndrome and significant sensitivity of the penis and is unable to tolerate office cystoscopy.     ALLERGIES: Beef/Pork - alphagal Seafood - Anaphylaxis, can't breathe    MEDICATIONS: Finasteride 5 MG Tablet 1 tablet PO Daily  Tamsulosin  HCl 0.4 MG Capsule 1 capsule PO Daily  Amlodipine  Besilate     GU PSH: Complex cystometrogram, with voiding pressure  studies, any technique - 07/03/2023 Complex Uroflow - 07/03/2023 Emg surf Electrd - 07/03/2023     NON-GU PSH: Visit Complexity (formerly GPC1X) - 11/02/2023, 07/12/2023, 05/23/2023, 03/19/2023     GU PMH: BPH w/LUTS - 11/02/2023, - 09/11/2023, - 08/27/2023, - 08/23/2023, - 07/27/2023, - 07/12/2023, - 07/03/2023, - 05/23/2023, - 03/19/2023, - 2022 (Worsening), He does have BPH by exam. He has mild voiding symptoms., - 2019 Incomplete bladder emptying - 11/02/2023, - 09/11/2023, - 08/27/2023, - 08/23/2023, - 07/27/2023, - 07/12/2023 Weak Urinary Stream - 11/02/2023, (Stable), - 07/12/2023, - 07/03/2023, - 05/23/2023, - 03/19/2023 Dysuria - 09/11/2023 Urinary Urgency - 09/11/2023, - 07/03/2023, - 05/23/2023, - 03/19/2023 Pelvic/perineal pain - 08/27/2023, - 08/23/2023, - 08/21/2023 Gross hematuria - 08/23/2023, - 08/21/2023 Ureteral calculus - 08/23/2023, - 07/27/2023, - 07/12/2023 Nocturia (Stable) - 07/12/2023, - 07/03/2023, - 05/23/2023 (Stable), - 03/19/2023, - 2022 (Improving), He has had improvement with both the Myrbetriq and the CPAP but doesn't want to continue on medication long-term if possible. I told him that that was fine with me and if he had further difficulties I would be happy to in follow-up. , - 2018, While his nocturia area has improved somewhat with the use of a CPAP mask he continues to be bothered by both daytime and nighttime voiding symptoms., - 2018 Urinary Frequency (Stable) - 07/12/2023, - 07/03/2023, - 05/23/2023, - 03/19/2023 Urinary Hesitancy - 07/12/2023 Elevated PSA - 05/23/2023, - 03/19/2023 (Improving), His prostate is benign and his  PSA has remained at an acceptable level currently at 2.58. What I have recommended is continued annual DRE and PSA with an extra PSA being checked in 6 months from now to assure stability., - 2020, I noted no abnormality on his DRE. He understands the slight risk of continued monitoring of his PSA but has elected to have this repeated again in 3 months with abstinence  prior to having his PSA drawn. He also understands that if his PSA continues to rise my recommendation is that we would then proceed with a prostate biopsy, - 2019 Overactive bladder, His symptoms are suggestive of bladder overactivity. He empties his bladder adequately and has clear urine so I am going to begin a trial of Myrbetriq 25 mg. I'll then have him return to reassess how this has worked for him in recheck his PVR. - 2018    NON-GU PMH: Bacteriuria - 09/11/2023 Tinea unguium - 2022 GERD Hypertension    FAMILY HISTORY: Cancer - Father, Mother   SOCIAL HISTORY: Marital Status: Married Preferred Language: English Current Smoking Status: Unknown if patient still smokes.   Tobacco Use Assessment Completed: Used Tobacco in last 30 days? Does not drink anymore.  Drinks 3 caffeinated drinks per day.    REVIEW OF SYSTEMS:    GU Review Male:   Patient denies frequent urination, hard to postpone urination, burning/ pain with urination, get up at night to urinate, leakage of urine, stream starts and stops, trouble starting your stream, have to strain to urinate , erection problems, and penile pain.  Gastrointestinal (Upper):   Patient denies nausea, vomiting, and indigestion/ heartburn.  Gastrointestinal (Lower):   Patient denies diarrhea and constipation.  Constitutional:   Patient denies night sweats, weight loss, fever, and fatigue.  Skin:   Patient denies skin rash/ lesion and itching.  Eyes:   Patient denies blurred vision and double vision.  Ears/ Nose/ Throat:   Patient denies sore throat and sinus problems.  Hematologic/Lymphatic:   Patient denies swollen glands and easy bruising.  Cardiovascular:   Patient denies leg swelling and chest pains.  Respiratory:   Patient denies cough and shortness of breath.  Endocrine:   Patient denies excessive thirst.  Musculoskeletal:   Patient denies back pain and joint pain.  Neurological:   Patient denies headaches and dizziness.   Psychologic:   Patient denies depression and anxiety.   VITAL SIGNS: None   MULTI-SYSTEM PHYSICAL EXAMINATION:    Constitutional: Well-nourished. No physical deformities. Normally developed. Good grooming.  Skin: No paleness, no jaundice, no cyanosis. No lesion, no ulcer, no rash.  Gastrointestinal: No mass, no tenderness, no rigidity, non obese abdomen.  Eyes: Normal conjunctivae. Normal eyelids.  Musculoskeletal: Normal gait and station of head and neck.     Complexity of Data:  Source Of History:  Patient  Records Review:   Previous Doctor Records, Previous Patient Records  Urine Test Review:   Urinalysis   05/23/23 03/19/23 11/01/20 06/18/19 12/06/18 09/19/18 06/06/18 01/02/17  PSA  Total PSA 4.52 ng/mL 5.15 ng/mL 3.83 ng/mL 2.57 ng/mL 2.58 ng/mL 2.54 ng/mL 3.74 ng/dl 0.45 ng/dl    PROCEDURES:          Visit Complexity - G2211          Urinalysis w/Scope Dipstick Dipstick Cont'd Micro  Color: Yellow Bilirubin: Neg mg/dL WBC/hpf: 0 - 5/hpf  Appearance: Clear Ketones: Neg mg/dL RBC/hpf: NS (Not Seen)  Specific Gravity: 1.015 Blood: Neg ery/uL Bacteria: NS (Not Seen)  pH: 6.0 Protein: 1+ mg/dL  Cystals: NS (Not Seen)  Glucose: Neg mg/dL Urobilinogen: 0.2 mg/dL Casts: NS (Not Seen)    Nitrites: Neg Trichomonas: Not Present    Leukocyte Esterase: Neg leu/uL Mucous: Present      Epithelial Cells: NS (Not Seen)      Yeast: NS (Not Seen)      Sperm: Not Present    ASSESSMENT:      ICD-10 Details  1 GU:   BPH w/LUTS - N40.1 Chronic, Worsening  2   Pelvic/perineal pain - R10.2 Chronic, Worsening  3   Weak Urinary Stream - R39.12 Chronic, Worsening  4   Splitting of urinary stream - R39.13 Chronic, Worsening  5   Straining on Urination - R39.16 Chronic, Worsening   PLAN:           Schedule Procedure: Unspecified Date at [facility] - 29199 - Cysto Uretheral Dilation (Cysto Dilate Stricture (M or F)) - 16109          Document Letter(s):  Created  for Patient: Clinical Summary         Notes:   I strongly suspect that he has a postoperative urethral stricture. He is not able to tolerate office cystoscopy. Will plan for diagnostic cystoscopy in the operating room with likely urethral dilation, possible direct visual internal ureterotomy. Risk benefits discussed including possibility of failure requiring urethroplasty.   CC: Dr. Maryrose Soja    Signed by Leila Punt, III, M.D. on 12/14/23 at 8:45 AM (EDT)

## 2023-12-31 NOTE — Discharge Instructions (Addendum)
 You may have some blood in your urine or draining around the catheter.  This is okay as long as her catheter is draining well.

## 2024-01-01 ENCOUNTER — Encounter (HOSPITAL_COMMUNITY): Payer: Self-pay | Admitting: Urology

## 2024-01-07 DIAGNOSIS — R3913 Splitting of urinary stream: Secondary | ICD-10-CM | POA: Diagnosis not present

## 2024-01-07 DIAGNOSIS — R102 Pelvic and perineal pain: Secondary | ICD-10-CM | POA: Diagnosis not present

## 2024-01-09 DIAGNOSIS — R3914 Feeling of incomplete bladder emptying: Secondary | ICD-10-CM | POA: Diagnosis not present

## 2024-01-14 DIAGNOSIS — S60561A Insect bite (nonvenomous) of right hand, initial encounter: Secondary | ICD-10-CM | POA: Diagnosis not present

## 2024-01-14 DIAGNOSIS — W57XXXA Bitten or stung by nonvenomous insect and other nonvenomous arthropods, initial encounter: Secondary | ICD-10-CM | POA: Diagnosis not present

## 2024-01-14 DIAGNOSIS — T63441A Toxic effect of venom of bees, accidental (unintentional), initial encounter: Secondary | ICD-10-CM | POA: Diagnosis not present

## 2024-01-20 DIAGNOSIS — G4733 Obstructive sleep apnea (adult) (pediatric): Secondary | ICD-10-CM | POA: Diagnosis not present

## 2024-02-19 DIAGNOSIS — G4733 Obstructive sleep apnea (adult) (pediatric): Secondary | ICD-10-CM | POA: Diagnosis not present

## 2024-02-25 DIAGNOSIS — N390 Urinary tract infection, site not specified: Secondary | ICD-10-CM | POA: Diagnosis not present

## 2024-02-25 DIAGNOSIS — N3000 Acute cystitis without hematuria: Secondary | ICD-10-CM | POA: Diagnosis not present

## 2024-02-25 DIAGNOSIS — B9620 Unspecified Escherichia coli [E. coli] as the cause of diseases classified elsewhere: Secondary | ICD-10-CM | POA: Diagnosis not present

## 2024-02-27 ENCOUNTER — Emergency Department (HOSPITAL_COMMUNITY)

## 2024-02-27 ENCOUNTER — Other Ambulatory Visit: Payer: Self-pay

## 2024-02-27 ENCOUNTER — Encounter (HOSPITAL_COMMUNITY): Payer: Self-pay

## 2024-02-27 ENCOUNTER — Inpatient Hospital Stay (HOSPITAL_COMMUNITY)
Admission: EM | Admit: 2024-02-27 | Discharge: 2024-02-29 | DRG: 872 | Disposition: A | Discharge status: Home / Self Care | Source: Ambulatory Visit | Attending: Internal Medicine | Admitting: Internal Medicine

## 2024-02-27 DIAGNOSIS — R21 Rash and other nonspecific skin eruption: Secondary | ICD-10-CM | POA: Diagnosis present

## 2024-02-27 DIAGNOSIS — G610 Guillain-Barre syndrome: Secondary | ICD-10-CM | POA: Diagnosis not present

## 2024-02-27 DIAGNOSIS — Z1152 Encounter for screening for COVID-19: Secondary | ICD-10-CM

## 2024-02-27 DIAGNOSIS — N400 Enlarged prostate without lower urinary tract symptoms: Secondary | ICD-10-CM | POA: Diagnosis not present

## 2024-02-27 DIAGNOSIS — E66811 Obesity, class 1: Secondary | ICD-10-CM | POA: Diagnosis not present

## 2024-02-27 DIAGNOSIS — A419 Sepsis, unspecified organism: Secondary | ICD-10-CM | POA: Diagnosis not present

## 2024-02-27 DIAGNOSIS — Z9079 Acquired absence of other genital organ(s): Secondary | ICD-10-CM | POA: Diagnosis not present

## 2024-02-27 DIAGNOSIS — R32 Unspecified urinary incontinence: Secondary | ICD-10-CM | POA: Diagnosis present

## 2024-02-27 DIAGNOSIS — Z91014 Allergy to mammalian meats: Secondary | ICD-10-CM | POA: Diagnosis not present

## 2024-02-27 DIAGNOSIS — Z91013 Allergy to seafood: Secondary | ICD-10-CM | POA: Diagnosis not present

## 2024-02-27 DIAGNOSIS — R6510 Systemic inflammatory response syndrome (SIRS) of non-infectious origin without acute organ dysfunction: Secondary | ICD-10-CM | POA: Diagnosis present

## 2024-02-27 DIAGNOSIS — K219 Gastro-esophageal reflux disease without esophagitis: Secondary | ICD-10-CM | POA: Diagnosis not present

## 2024-02-27 DIAGNOSIS — N390 Urinary tract infection, site not specified: Secondary | ICD-10-CM | POA: Diagnosis present

## 2024-02-27 DIAGNOSIS — I10 Essential (primary) hypertension: Secondary | ICD-10-CM | POA: Diagnosis not present

## 2024-02-27 DIAGNOSIS — I447 Left bundle-branch block, unspecified: Secondary | ICD-10-CM | POA: Diagnosis not present

## 2024-02-27 DIAGNOSIS — F17290 Nicotine dependence, other tobacco product, uncomplicated: Secondary | ICD-10-CM | POA: Diagnosis present

## 2024-02-27 DIAGNOSIS — G4733 Obstructive sleep apnea (adult) (pediatric): Secondary | ICD-10-CM | POA: Diagnosis not present

## 2024-02-27 DIAGNOSIS — R509 Fever, unspecified: Principal | ICD-10-CM

## 2024-02-27 DIAGNOSIS — Z6833 Body mass index (BMI) 33.0-33.9, adult: Secondary | ICD-10-CM | POA: Diagnosis not present

## 2024-02-27 DIAGNOSIS — N281 Cyst of kidney, acquired: Secondary | ICD-10-CM | POA: Diagnosis not present

## 2024-02-27 DIAGNOSIS — D3502 Benign neoplasm of left adrenal gland: Secondary | ICD-10-CM | POA: Diagnosis not present

## 2024-02-27 DIAGNOSIS — Z833 Family history of diabetes mellitus: Secondary | ICD-10-CM | POA: Diagnosis not present

## 2024-02-27 DIAGNOSIS — J9811 Atelectasis: Secondary | ICD-10-CM | POA: Diagnosis not present

## 2024-02-27 LAB — COMPREHENSIVE METABOLIC PANEL WITH GFR
ALT: 31 U/L (ref 0–44)
AST: 22 U/L (ref 15–41)
Albumin: 4.3 g/dL (ref 3.5–5.0)
Alkaline Phosphatase: 76 U/L (ref 38–126)
Anion gap: 9 (ref 5–15)
BUN: 14 mg/dL (ref 8–23)
CO2: 21 mmol/L — ABNORMAL LOW (ref 22–32)
Calcium: 9.3 mg/dL (ref 8.9–10.3)
Chloride: 107 mmol/L (ref 98–111)
Creatinine, Ser: 1.12 mg/dL (ref 0.61–1.24)
GFR, Estimated: >60 mL/min (ref >60)
Glucose, Bld: 124 mg/dL — ABNORMAL HIGH (ref 70–99)
Potassium: 3.4 mmol/L — ABNORMAL LOW (ref 3.5–5.1)
Sodium: 137 mmol/L (ref 135–145)
Total Bilirubin: 1 mg/dL (ref 0.0–1.2)
Total Protein: 7.4 g/dL (ref 6.5–8.1)

## 2024-02-27 LAB — CBC WITH DIFFERENTIAL/PLATELET
Abs Immature Granulocytes: 0.02 K/uL (ref 0.00–0.07)
Basophils Absolute: 0 K/uL (ref 0.0–0.1)
Basophils Relative: 1 %
Eosinophils Absolute: 0 K/uL (ref 0.0–0.5)
Eosinophils Relative: 0 %
HCT: 47.7 % (ref 39.0–52.0)
Hemoglobin: 15.5 g/dL (ref 13.0–17.0)
Immature Granulocytes: 0 %
Lymphocytes Relative: 12 %
Lymphs Abs: 0.8 K/uL (ref 0.7–4.0)
MCH: 27.7 pg (ref 26.0–34.0)
MCHC: 32.5 g/dL (ref 30.0–36.0)
MCV: 85.2 fL (ref 80.0–100.0)
Monocytes Absolute: 0.4 K/uL (ref 0.1–1.0)
Monocytes Relative: 6 %
Neutro Abs: 5 K/uL (ref 1.7–7.7)
Neutrophils Relative %: 81 %
Platelets: 193 K/uL (ref 150–400)
RBC: 5.6 MIL/uL (ref 4.22–5.81)
RDW: 13.3 % (ref 11.5–15.5)
WBC: 6.1 K/uL (ref 4.0–10.5)
nRBC: 0 % (ref 0.0–0.2)

## 2024-02-27 LAB — URINALYSIS, ROUTINE W REFLEX MICROSCOPIC
Bilirubin Urine: NEGATIVE
Glucose, UA: NEGATIVE mg/dL
Hgb urine dipstick: NEGATIVE
Ketones, ur: NEGATIVE mg/dL
Leukocytes,Ua: NEGATIVE
Nitrite: NEGATIVE
Protein, ur: NEGATIVE mg/dL
Specific Gravity, Urine: 1.017 (ref 1.005–1.030)
pH: 6 (ref 5.0–8.0)

## 2024-02-27 LAB — RESP PANEL BY RT-PCR (RSV, FLU A&B, COVID)  RVPGX2
Influenza A by PCR: NEGATIVE
Influenza B by PCR: NEGATIVE
Resp Syncytial Virus by PCR: NEGATIVE
SARS Coronavirus 2 by RT PCR: NEGATIVE

## 2024-02-27 LAB — I-STAT CG4 LACTIC ACID, ED: Lactic Acid, Venous: 1.4 mmol/L (ref 0.5–1.9)

## 2024-02-27 MED ORDER — TAMSULOSIN HCL 0.4 MG PO CAPS
0.4000 mg | ORAL_CAPSULE | Freq: Every day | ORAL | Status: DC
  Administered 2024-02-28 – 2024-02-29 (×2): 0.4 mg via ORAL
  Filled 2024-02-27 (×2): qty 1

## 2024-02-27 MED ORDER — VANCOMYCIN HCL 2000 MG/400ML IV SOLN
2000.0000 mg | Freq: Once | INTRAVENOUS | Status: AC
  Administered 2024-02-27: 2000 mg via INTRAVENOUS
  Filled 2024-02-27: qty 400

## 2024-02-27 MED ORDER — ACETAMINOPHEN 500 MG PO TABS
500.0000 mg | ORAL_TABLET | Freq: Once | ORAL | Status: AC
  Administered 2024-02-27: 500 mg via ORAL
  Filled 2024-02-27: qty 1

## 2024-02-27 MED ORDER — ONDANSETRON HCL 4 MG PO TABS
4.0000 mg | ORAL_TABLET | Freq: Four times a day (QID) | ORAL | Status: DC | PRN
Start: 2024-02-27 — End: 2024-02-29

## 2024-02-27 MED ORDER — SODIUM CHLORIDE 0.9 % IV SOLN
2.0000 g | INTRAVENOUS | Status: DC
  Administered 2024-02-27 – 2024-02-28 (×2): 2 g via INTRAVENOUS
  Filled 2024-02-27 (×2): qty 20

## 2024-02-27 MED ORDER — HYDROMORPHONE HCL 1 MG/ML IJ SOLN
1.0000 mg | Freq: Once | INTRAMUSCULAR | Status: AC
  Administered 2024-02-27: 1 mg via INTRAVENOUS
  Filled 2024-02-27: qty 1

## 2024-02-27 MED ORDER — POTASSIUM CHLORIDE CRYS ER 20 MEQ PO TBCR
40.0000 meq | EXTENDED_RELEASE_TABLET | Freq: Once | ORAL | Status: AC
  Administered 2024-02-27: 40 meq via ORAL
  Filled 2024-02-27: qty 2

## 2024-02-27 MED ORDER — FINASTERIDE 5 MG PO TABS
5.0000 mg | ORAL_TABLET | Freq: Every day | ORAL | Status: DC
  Administered 2024-02-28 – 2024-02-29 (×2): 5 mg via ORAL
  Filled 2024-02-27 (×2): qty 1

## 2024-02-27 MED ORDER — ACETAMINOPHEN 160 MG/5ML PO SOLN
650.0000 mg | Freq: Four times a day (QID) | ORAL | Status: DC | PRN
  Administered 2024-02-28 (×2): 650 mg via ORAL
  Filled 2024-02-27 (×2): qty 20.3

## 2024-02-27 MED ORDER — RIVAROXABAN 10 MG PO TABS
10.0000 mg | ORAL_TABLET | Freq: Every day | ORAL | Status: DC
  Filled 2024-02-27: qty 1

## 2024-02-27 MED ORDER — SODIUM CHLORIDE 0.9 % IV BOLUS
1000.0000 mL | Freq: Once | INTRAVENOUS | Status: AC
  Administered 2024-02-27: 1000 mL via INTRAVENOUS

## 2024-02-27 MED ORDER — VANCOMYCIN HCL 1500 MG/300ML IV SOLN: 1500.0000 mg | INTRAVENOUS | Status: DC

## 2024-02-27 MED ORDER — ONDANSETRON HCL 4 MG/2ML IJ SOLN: 4.0000 mg | Freq: Four times a day (QID) | INTRAMUSCULAR | Status: DC | PRN

## 2024-02-27 MED ORDER — ACETAMINOPHEN 650 MG RE SUPP: 650.0000 mg | Freq: Four times a day (QID) | RECTAL | Status: DC | PRN

## 2024-02-27 MED ORDER — IOHEXOL 300 MG/ML  SOLN
100.0000 mL | Freq: Once | INTRAMUSCULAR | Status: AC | PRN
Start: 2024-02-27 — End: 2024-02-27
  Administered 2024-02-27: 100 mL via INTRAVENOUS

## 2024-02-27 MED ORDER — LORATADINE 10 MG PO TABS
10.0000 mg | ORAL_TABLET | Freq: Every day | ORAL | Status: DC
  Administered 2024-02-27 – 2024-02-28 (×2): 10 mg via ORAL
  Filled 2024-02-27 (×2): qty 1

## 2024-02-27 MED ORDER — AMLODIPINE BESYLATE 5 MG PO TABS
10.0000 mg | ORAL_TABLET | Freq: Every day | ORAL | Status: DC
  Administered 2024-02-28 – 2024-02-29 (×2): 10 mg via ORAL
  Filled 2024-02-27 (×2): qty 2

## 2024-02-27 MED ORDER — SENNOSIDES-DOCUSATE SODIUM 8.6-50 MG PO TABS
1.0000 | ORAL_TABLET | Freq: Every evening | ORAL | Status: DC | PRN
Start: 2024-02-27 — End: 2024-02-29

## 2024-02-27 NOTE — ED Provider Notes (Signed)
 [place] EMERGENCY DEPARTMENT AT [facility] Provider Note   CSN: [id number] Arrival date & time: 02/27/24  1215     Patient presents with: UTI   Steven Steele is a 63 y.o. male.  {Add pertinent medical, surgical, social history, OB history to HPI:32947} HPI     Seeing urology, had TURP After that urine began to have a foul odor, turned out to be UTI, gave him bactrim then last night went to a meeting and when he was there began to have uncontrollable fevers, 101.7 fever at home, wanted to wait Since his TURP procedures has had incontinence Urinating like a sprayer  Has not been himself for the last month This AM took some gas ex Nausea, no vomiting Diarrhea x 1 Monday rx the bactrim Hurts to urinate sometimes Not having abdominal pain no new back pain, has some pain normally Had GBS, has sensitivity since then 6 No cough Right side of penis had marble size rash No sore throat, congestion      Prior to Admission medications   Medication Sig Start Date End Date Taking? Authorizing Provider  amLODipine  (NORVASC ) 10 MG tablet Take 10 mg by mouth in the morning. 11/03/20   [provider]  amoxicillin -clavulanate (AUGMENTIN ) 875-125 MG tablet Take 1 tablet by mouth 2 (two) times daily. 12/31/23   Carolee Sherwood JONETTA DOUGLAS, MD  cetirizine (ZYRTEC) 10 MG tablet Take 10 mg by mouth at bedtime.    [provider]  clobetasol cream (TEMOVATE) 0.05 % Apply 1 Application topically 2 (two) times daily as needed (eczema). 12/03/23   [provider]  EPINEPHrine  0.3 mg/0.3 mL IJ SOAJ injection Inject 0.3 mg into the muscle as needed for anaphylaxis. 06/06/16   [provider]  finasteride  (PROSCAR ) 5 MG tablet Take 5 mg by mouth in the morning.    [provider]  HYDROcodone -acetaminophen  (NORCO/VICODIN) 5-325 MG tablet Take 1 tablet by mouth every 6 (six) hours as needed. 12/31/23   Carolee Sherwood JONETTA DOUGLAS, MD  tamsulosin  (FLOMAX )  0.4 MG CAPS capsule Take 0.4 mg by mouth in the morning.    [provider]    Allergies: Georgianne cerise allergy ], Alpha-gal, Beef-derived drug products, and Pork allergy     Review of Systems  Updated Vital Signs BP (!) 170/94 (BP Location: Right Arm)   Pulse (!) 102   Temp 99.6 F (37.6 C) (Oral)   Resp 14   Ht 5' 8 (1.727 m)   Wt 99.8 kg   SpO2 98%   BMI 33.45 kg/m   Physical Exam  (all labs ordered are listed, but only abnormal results are displayed) Labs Reviewed  COMPREHENSIVE METABOLIC PANEL WITH GFR - Abnormal; Notable for the following components:      Result Value   Potassium 3.4 (*)    CO2 21 (*)    Glucose, Bld 124 (*)    All other components within normal limits  CULTURE, BLOOD (ROUTINE X 2)  CULTURE, BLOOD (ROUTINE X 2)  URINALYSIS, ROUTINE W REFLEX MICROSCOPIC  CBC WITH DIFFERENTIAL/PLATELET  I-STAT CG4 LACTIC ACID, ED  I-STAT CG4 LACTIC ACID, ED    EKG: None  Radiology: No results found.  {Document cardiac monitor, telemetry assessment procedure when appropriate:32947} Procedures   Medications Ordered in the ED  HYDROmorphone  (DILAUDID ) injection 1 mg (1 mg Intravenous Given 02/27/24 1354)      {Click here for ABCD2, HEART and other calculators REFRESH Note before signing:1}  Medical Decision Making Amount and/or Complexity of Data Reviewed Labs: ordered.  Risk Prescription drug management.   ***  {Document critical care time when appropriate  Document review of labs and clinical decision tools ie CHADS2VASC2, etc  Document your independent review of radiology images and any outside records  Document your discussion with family members, caretakers and with consultants  Document social determinants of health affecting pt's care  Document your decision making why or why not admission, treatments were needed:32947:::1}   Final diagnoses:  None    ED Discharge Orders     None

## 2024-02-27 NOTE — ED Notes (Signed)
 [place] PIV placed.

## 2024-02-27 NOTE — H&P (Incomplete)
 History and Physical  Steven Steele FMW:[id number] DOB: Dec 15, 1960 DOA: 02/27/2024  PCP: Kip Righter, MD   Chief Complaint: Fever  HPI: Steven Steele is a 63 y.o. male with medical history significant for HTN, OSA on CPAP, Guillain Barre, GERD, alpha gal allergy , arthritis, and allergies who presented to the ED for evaluation of fever.  Patient reports that he has had a TURP procedure in December 2024.  Has had intermittent difficulty voiding with urethral pain since then. Over the last 3 to 4 weeks, he has had dysuria urinary frequency/urgency and splitting of the urinary stream.  She was seen by his urologist on Monday and was prescribed Bactrim for urinary tract infection. He woke up this morning with a fever of 101.9 so he called his urologist who informed him to present to the ED for IV antibiotics. Reports his urinary symptoms have improved but he feels lethargic and just not feeling well. He endorsed 1 watery stool during this week but denies any headache, lightheadedness, nausea, vomiting, abdominal pain, chest pain, shortness of breath or hematuria.  He does report removing a tick from between his toes 3 weeks ago.  ED Course: Initial vitals show temp 99.6->102.7, HR 102, BP 170/79, SpO2 98% on room air. Initial labs significant for normal CBC, K+ 3.4, glucose 124, normal kidney function, normal LFTs, normal lactic acid, negative COVID, flu and RSV test, UA with no signs of infection.  EKG shows sinus rhythm with LBBB. CXR shows no active disease. CT A/P shows no intra-abdominal or pelvic pathology. Pt received Tylenol  500 mg x 1, and IV Dilaudid  1 mg x 1. [facility] was consulted for admission.   Review of Systems: Please see HPI for pertinent positives and negatives. A complete 10 system review of systems are otherwise negative.  Past Medical History:  Diagnosis Date  . Arthritis    neck  . Chronic kidney disease   . GERD (gastroesophageal reflux disease)   . Guillain-Barre  (HCC)   . H/O ETOH abuse    sober x 37 years.  . History of kidney stones   . Hypertension   . Sleep apnea    uses CPAP   Past Surgical History:  Procedure Laterality Date  . CYSTOSCOPY WITH FULGERATION Right 08/21/2023   Procedure: CYSTOSCOPY , RIGHT URETERAL STENT REMOVAL;  Surgeon: Carolee Sherwood JONETTA DOUGLAS, MD;  Location: [facility];  Service: Urology;  Laterality: Right;  . CYSTOSCOPY WITH URETHRAL DILATATION N/A 12/31/2023   Procedure: CYSTOSCOPY, WITH URETHRAL DILATION;  Surgeon: Carolee Sherwood JONETTA DOUGLAS, MD;  Location: [facility];  Service: Urology;  Laterality: N/A;  . CYSTOSCOPY/URETEROSCOPY/HOLMIUM LASER/STENT PLACEMENT Right 08/17/2023   Procedure: CYSTOSCOPY/RIGHT URETEROSCOPY/HOLMIUM LASER/STENT PLACEMENT AND RETROGRADE PYELOGRAM;  Surgeon: Carolee Sherwood JONETTA DOUGLAS, MD;  Location: [facility];  Service: Urology;  Laterality: Right;  2 HOUR CASE  . NO PAST SURGERIES    . TONSILLECTOMY    . TRANSURETHRAL RESECTION OF PROSTATE N/A 08/17/2023   Procedure: TRANSURETHRAL RESECTION OF THE PROSTATE (TURP);  Surgeon: Carolee Sherwood JONETTA DOUGLAS, MD;  Location: [facility];  Service: Urology;  Laterality: N/A;   Social History:  reports that he has been smoking cigars. He has never been exposed to tobacco smoke. He has never used smokeless tobacco. He reports that he does not drink alcohol and does not use drugs.  Allergies  Allergen Reactions  . Crab [Shellfish Allergy ] Anaphylaxis    Alpha gal  . Alpha-Gal Other (See Comments)  . Beef-Derived Drug Products  HAS ALPHA-GAL   . Pork Allergy  Other (See Comments)    GI issues (resulted in hospitalization) ALPHA GAL    Family History  Problem Relation Age of Onset  . Cancer Mother        Breast  . Cancer Father        Lung  . Diabetes Paternal Uncle   . Healthy Brother   . Healthy Sister      Prior to Admission medications   Medication Sig Start Date End Date Taking? Authorizing Provider  amLODipine  (NORVASC ) 10 MG tablet Take 10 mg by mouth in the morning.  11/03/20   [provider]  amoxicillin -clavulanate (AUGMENTIN ) 875-125 MG tablet Take 1 tablet by mouth 2 (two) times daily. 12/31/23   Carolee Sherwood JONETTA DOUGLAS, MD  cetirizine (ZYRTEC) 10 MG tablet Take 10 mg by mouth at bedtime.    [provider]  clobetasol cream (TEMOVATE) 0.05 % Apply 1 Application topically 2 (two) times daily as needed (eczema). 12/03/23   [provider]  EPINEPHrine  0.3 mg/0.3 mL IJ SOAJ injection Inject 0.3 mg into the muscle as needed for anaphylaxis. 06/06/16   [provider]  finasteride  (PROSCAR ) 5 MG tablet Take 5 mg by mouth in the morning.    [provider]  HYDROcodone -acetaminophen  (NORCO/VICODIN) 5-325 MG tablet Take 1 tablet by mouth every 6 (six) hours as needed. 12/31/23   Carolee Sherwood JONETTA DOUGLAS, MD  tamsulosin  (FLOMAX ) 0.4 MG CAPS capsule Take 0.4 mg by mouth in the morning.    [provider]    Physical Exam: BP 136/75 (BP Location: Right Arm)   Pulse 95   Temp 99.2 F (37.3 C) (Oral)   Resp 20   Ht 5' 8 (1.727 m)   Wt 99.8 kg   SpO2 95%   BMI 33.45 kg/m  General: Pleasant, acutely ill-appearing elderly man laying in bed. No acute distress. HEENT: [place]/AT. Anicteric sclera CV: Mild tachycardic. Regular rhythm. No murmurs, rubs, or gallops. No LE edema Pulmonary: Lungs CTAB. Normal effort. No wheezing or rales. Abdominal: Soft, nontender, nondistended. Normal bowel sounds. Extremities: Palpable radial and DP pulses. Normal ROM. Skin: Warm and slightly diaphoretic. No obvious rash or lesions. Neuro: A&Ox3. Moves all extremities. Normal sensation to light touch. No focal deficit. Psych: Normal mood and affect          Labs on Admission:  Basic Metabolic Panel: Recent Labs  Lab 02/27/24 1240  NA 137  K 3.4*  CL 107  CO2 21*  GLUCOSE 124*  BUN 14  CREATININE 1.12  CALCIUM 9.3   Liver Function Tests: Recent Labs  Lab 02/27/24 1240  AST 22  ALT 31  ALKPHOS 76  BILITOT 1.0  PROT 7.4   ALBUMIN 4.3   No results for input(s): LIPASE, AMYLASE in the last 168 hours. No results for input(s): AMMONIA in the last 168 hours. CBC: Recent Labs  Lab 02/27/24 1240  WBC 6.1  NEUTROABS 5.0  HGB 15.5  HCT 47.7  MCV 85.2  PLT 193   Cardiac Enzymes: No results for input(s): CKTOTAL, CKMB, CKMBINDEX, TROPONINI in the last 168 hours. BNP (last 3 results) No results for input(s): BNP in the last 8760 hours.  ProBNP (last 3 results) No results for input(s): PROBNP in the last 8760 hours.  CBG: No results for input(s): GLUCAP in the last 168 hours.  Radiological Exams on Admission: DG Chest Portable 1 View Result Date: 02/27/2024 CLINICAL DATA:  Fever. EXAM: PORTABLE CHEST 1 VIEW  COMPARISON:  Jan 18, 2021. FINDINGS: Stable cardiomediastinal silhouette. Minimal bibasilar subsegmental atelectasis is noted. Bony thorax is unremarkable. IMPRESSION: Minimal bibasilar subsegmental atelectasis. Electronically Signed   By: Lynwood Landy Raddle M.D.   On: 02/27/2024 18:34   CT ABDOMEN PELVIS W CONTRAST Result Date: 02/27/2024 CLINICAL DATA:  Abdominal pain, acute, nonlocalized low back, scrotal pain, fever, recent procedures. EXAM: CT ABDOMEN AND PELVIS WITH CONTRAST TECHNIQUE: Multidetector CT imaging of the abdomen and pelvis was performed using the standard protocol following bolus administration of intravenous contrast. RADIATION DOSE REDUCTION: This exam was performed according to the departmental dose-optimization program which includes automated exposure control, adjustment of the mA and/or kV according to patient size and/or use of iterative reconstruction technique. CONTRAST:  OMNIPAQUE  IOHEXOL  300 MG/ML  SOLN COMPARISON:  CT scan abdomen from 06/27/2023. FINDINGS: Lower chest: There are patchy atelectatic changes in the visualized lung bases. No overt consolidation. No pleural effusion. The heart is normal in size. No pericardial effusion. Hepatobiliary: The liver is  normal in size. Non-cirrhotic configuration. No suspicious mass. These is mild diffuse hepatic steatosis. No intrahepatic or extrahepatic bile duct dilation. No calcified gallstones. Normal gallbladder wall thickness. No pericholecystic inflammatory changes. Pancreas: Unremarkable. No pancreatic ductal dilatation or surrounding inflammatory changes. Spleen: Within normal limits. No focal lesion. Adrenals/Urinary Tract: There is a 1.8 x 2.4 cm the stable left adrenal adenoma. Unremarkable right adrenal gland. No suspicious renal mass. There is a partially exophytic 3.4 x 3.6 cm cyst arising from the right kidney upper pole, posteromedially. There additional subcentimeter sized hypoattenuating foci in bilateral kidneys, which are too small to adequately characterize. No hydroureteronephrosis or nephroureterolithiasis on either side. Urinary bladder is under distended, precluding optimal assessment. However, no large mass or stones identified. No perivesical fat stranding. Stomach/Bowel: There is a small diverticulum arising from the second part of duodenum. No disproportionate dilation of the small or large bowel loops. No evidence of abnormal bowel wall thickening or inflammatory changes. The appendix is unremarkable. Vascular/Lymphatic: No ascites or pneumoperitoneum. No abdominal or pelvic lymphadenopathy, by size criteria. No aneurysmal dilation of the major abdominal arteries. There are mild peripheral atherosclerotic vascular calcifications of the aorta and its major branches. Reproductive: Normal size prostate. Symmetric seminal vesicles. Other: There are small fat containing umbilical and right inguinal hernias. The soft tissues and abdominal wall are otherwise unremarkable. Musculoskeletal: No suspicious osseous lesions. There are mild multilevel degenerative changes in the visualized spine. IMPRESSION: 1. No acute inflammatory process identified within the abdomen or pelvis. 2. No nephroureterolithiasis or  obstructive uropathy. 3. Multiple other nonacute observations, as described above. Aortic Atherosclerosis (ICD10-I70.0). Electronically Signed   By: Ree Molt M.D.   On: 02/27/2024 16:06   Assessment/Plan Steven Steele is a 63 y.o. male with medical history significant for  HTN, OSA on CPAP, Guillain Barre, GERD, alpha gal allergy , arthritis, and allergies who presented to the ED for evaluation of fever and admitted for SIRS and possible sepsis of unknown etiology.   # SIRS # ?Sepsis   # BPH # Chronic voiding symptoms - Follows closely with urology, status post TURP in 07/2023 - Reported urinary symptoms for the last 3 to 4 weeks, recently treated for UTI with Bactrim - Repeat urinalysis does not show any evidence of urinary infection - Continue tamsulosin  and finasteride  - Follow-up with urology in the outpatient  # HTN - BP elevated with SBP in the 140s to 170s on admission slightly improved to the 110s to 130s - Continue  amlodipine   # Allergies - Continue loratadine   # OSA - CPAP at bedtime  # Hx of Guillain Barr - Sensitive to pain at baseline but no acute symptoms   DVT prophylaxis: Lovenox      Code Status: Full Code  Consults called: None  Family Communication: Discussed admission with spouse at bedside  Severity of Illness: The appropriate patient status for this patient is OBSERVATION. Observation status is judged to be reasonable and necessary in order to provide the required intensity of service to ensure the patient's safety. The patient's presenting symptoms, physical exam findings, and initial radiographic and laboratory data in the context of their medical condition is felt to place them at decreased risk for further clinical deterioration. Furthermore, it is anticipated that the patient will be medically stable for discharge from the hospital within 2 midnights of admission.   Level of care: Telemetry   This record has been created using [employer]. Errors have been sought and corrected, but may not always be located. Such creation errors do not reflect on the standard of care.   Lou Claretta HERO, MD 02/27/2024, 10:31 PM [facility] Pager: [phone] Isaiah 41:10   If 7PM-7AM, please contact night-coverage [email or web address] Password [email or web address]

## 2024-02-27 NOTE — Progress Notes (Signed)
 Pharmacy Antibiotic Note  Steven Steele is a 63 y.o. male admitted on 02/27/2024 with sepsis and UTI.  Pharmacy has been consulted for Vancomycin  dosing.  Plan: Ceftriaxone  per MD Vancomycin  2g x1 then 1500 mg IV q24h  (SCr 1.12, Vd 0.5, est AUC 500) Measure Vanc levels as needed.  Goal AUC = 400 - 550 Follow up renal function, culture results, and clinical course.   Height: 5' 8 (172.7 cm) Weight: 99.8 kg (220 lb) IBW/kg (Calculated) : 68.4  Temp (24hrs), Avg:100.8 F (38.2 C), Min:99.4 F (37.4 C), Max:102.8 F (39.3 C)  Recent Labs  Lab 02/27/24 1240 02/27/24 1348  WBC 6.1  --   CREATININE 1.12  --   LATICACIDVEN  --  1.4    Estimated Creatinine Clearance: 79.4 mL/min (by C-G formula based on SCr of 1.12 mg/dL).    Allergies  Allergen Reactions   Crab [Shellfish Allergy ] Anaphylaxis    Alpha gal   Alpha-Gal Other (See Comments)   Beef-Derived Drug Products     HAS ALPHA-GAL    Pork Allergy  Other (See Comments)    GI issues (resulted in hospitalization) ALPHA GAL    Antimicrobials this admission: 7/9 Ceftriaxone  >> 7/9 Vancomycin  >>   Dose adjustments this admission:   Microbiology results: 7/9 BCx:   Thank you for allowing pharmacy to be a part of this patient's care.  Wanda Hasting PharmD, BCPS [facility] [phone] 02/27/2024 8:25 PM

## 2024-02-27 NOTE — H&P (Incomplete)
 History and Physical  Steven Steele FMW:[id number] DOB: January 25, 1961 DOA: 02/27/2024  PCP: Kip Righter, MD   Chief Complaint: Fever  HPI: Steven Steele is a 63 y.o. male with medical history significant for HTN, OSA on CPAP, Guillain Barre, GERD, alpha gal allergy , arthritis, and allergies who presented to the ED for evaluation of fever.  Patient reports that he has had a TURP procedure in December 2024.  Has had intermittent difficulty voiding with urethral pain since then. Over the last 3 to 4 weeks, he has had dysuria urinary frequency/urgency and splitting of the urinary stream.  She was seen by his urologist on Monday and was prescribed Bactrim for urinary tract infection. He woke up this morning with a fever of 101.9 so he called his urologist who informed him to present to the ED for IV antibiotics. Reports his urinary symptoms have improved but he feels lethargic and just not feeling well. He endorsed 1 watery stool during this week but denies any headache, lightheadedness, nausea, vomiting, abdominal pain, chest pain, shortness of breath or hematuria.  He does report removing a tick from between his toes 3 weeks ago.  ED Course: Initial vitals show temp 99.6->102.7, HR 102, BP 170/79, SpO2 98% on room air. Initial labs significant for normal CBC, K+ 3.4, glucose 124, normal kidney function, normal LFTs, normal lactic acid, negative COVID, flu and RSV test, UA with no signs of infection.  EKG shows sinus rhythm with LBBB. CXR shows no active disease. CT A/P shows no intra-abdominal or pelvic pathology. Pt received Tylenol  500 mg x 1, and IV Dilaudid  1 mg x 1. [facility] was consulted for admission.   Review of Systems: Please see HPI for pertinent positives and negatives. A complete 10 system review of systems are otherwise negative.  Past Medical History:  Diagnosis Date   Arthritis    neck   Chronic kidney disease    GERD (gastroesophageal reflux disease)    Guillain-Barre  (HCC)    H/O ETOH abuse    sober x 37 years.   History of kidney stones    Hypertension    Sleep apnea    uses CPAP   Past Surgical History:  Procedure Laterality Date   CYSTOSCOPY WITH FULGERATION Right 08/21/2023   Procedure: CYSTOSCOPY , RIGHT URETERAL STENT REMOVAL;  Surgeon: Carolee Sherwood JONETTA DOUGLAS, MD;  Location: [facility];  Service: Urology;  Laterality: Right;   CYSTOSCOPY WITH URETHRAL DILATATION N/A 12/31/2023   Procedure: CYSTOSCOPY, WITH URETHRAL DILATION;  Surgeon: Carolee Sherwood JONETTA DOUGLAS, MD;  Location: [facility];  Service: Urology;  Laterality: N/A;   CYSTOSCOPY/URETEROSCOPY/HOLMIUM LASER/STENT PLACEMENT Right 08/17/2023   Procedure: CYSTOSCOPY/RIGHT URETEROSCOPY/HOLMIUM LASER/STENT PLACEMENT AND RETROGRADE PYELOGRAM;  Surgeon: Carolee Sherwood JONETTA DOUGLAS, MD;  Location: [facility];  Service: Urology;  Laterality: Right;  2 HOUR CASE   NO PAST SURGERIES     TONSILLECTOMY     TRANSURETHRAL RESECTION OF PROSTATE N/A 08/17/2023   Procedure: TRANSURETHRAL RESECTION OF THE PROSTATE (TURP);  Surgeon: Carolee Sherwood JONETTA DOUGLAS, MD;  Location: [facility];  Service: Urology;  Laterality: N/A;   Social History:  reports that he has been smoking cigars. He has never been exposed to tobacco smoke. He has never used smokeless tobacco. He reports that he does not drink alcohol and does not use drugs.  Allergies  Allergen Reactions   Crab [Shellfish Allergy ] Anaphylaxis    Alpha gal   Alpha-Gal Other (See Comments)   Beef-Derived Drug Products  HAS ALPHA-GAL    Pork Allergy  Other (See Comments)    GI issues (resulted in hospitalization) ALPHA GAL    Family History  Problem Relation Age of Onset   Cancer Mother        Breast   Cancer Father        Lung   Diabetes Paternal Uncle    Healthy Brother    Healthy Sister      Prior to Admission medications   Medication Sig Start Date End Date Taking? Authorizing Provider  amLODipine  (NORVASC ) 10 MG tablet Take 10 mg by mouth in the morning. 11/03/20   [provider]  amoxicillin -clavulanate (AUGMENTIN ) 875-125 MG tablet Take 1 tablet by mouth 2 (two) times daily. 12/31/23   Carolee Sherwood JONETTA DOUGLAS, MD  cetirizine (ZYRTEC) 10 MG tablet Take 10 mg by mouth at bedtime.    [provider]  clobetasol cream (TEMOVATE) 0.05 % Apply 1 Application topically 2 (two) times daily as needed (eczema). 12/03/23   [provider]  EPINEPHrine  0.3 mg/0.3 mL IJ SOAJ injection Inject 0.3 mg into the muscle as needed for anaphylaxis. 06/06/16   [provider]  finasteride  (PROSCAR ) 5 MG tablet Take 5 mg by mouth in the morning.    [provider]  HYDROcodone -acetaminophen  (NORCO/VICODIN) 5-325 MG tablet Take 1 tablet by mouth every 6 (six) hours as needed. 12/31/23   Carolee Sherwood JONETTA DOUGLAS, MD  tamsulosin  (FLOMAX ) 0.4 MG CAPS capsule Take 0.4 mg by mouth in the morning.    [provider]    Physical Exam: BP 136/75 (BP Location: Right Arm)   Pulse 95   Temp 99.2 F (37.3 C) (Oral)   Resp 20   Ht 5' 8 (1.727 m)   Wt 99.8 kg   SpO2 95%   BMI 33.45 kg/m  General: Pleasant, acutely ill-appearing elderly man laying in bed. No acute distress. HEENT: [place]/AT. Anicteric sclera CV: Mild tachycardic. Regular rhythm. No murmurs, rubs, or gallops. No LE edema Pulmonary: Lungs CTAB. Normal effort. No wheezing or rales. Abdominal: Soft, nontender, nondistended. Normal bowel sounds. Extremities: Palpable radial and DP pulses. Normal ROM. Skin: Warm and slightly diaphoretic. No obvious rash or lesions. Neuro: A&Ox3. Moves all extremities. Normal sensation to light touch. No focal deficit. Psych: Normal mood and affect          Labs on Admission:  Basic Metabolic Panel: Recent Labs  Lab 02/27/24 1240  NA 137  K 3.4*  CL 107  CO2 21*  GLUCOSE 124*  BUN 14  CREATININE 1.12  CALCIUM 9.3   Liver Function Tests: Recent Labs  Lab 02/27/24 1240  AST 22  ALT 31  ALKPHOS 76  BILITOT 1.0  PROT 7.4  ALBUMIN 4.3   No  results for input(s): LIPASE, AMYLASE in the last 168 hours. No results for input(s): AMMONIA in the last 168 hours. CBC: Recent Labs  Lab 02/27/24 1240  WBC 6.1  NEUTROABS 5.0  HGB 15.5  HCT 47.7  MCV 85.2  PLT 193   Cardiac Enzymes: No results for input(s): CKTOTAL, CKMB, CKMBINDEX, TROPONINI in the last 168 hours. BNP (last 3 results) No results for input(s): BNP in the last 8760 hours.  ProBNP (last 3 results) No results for input(s): PROBNP in the last 8760 hours.  CBG: No results for input(s): GLUCAP in the last 168 hours.  Radiological Exams on Admission: DG Chest Portable 1 View Result Date: 02/27/2024 CLINICAL DATA:  Fever. EXAM: PORTABLE CHEST 1 VIEW  COMPARISON:  Jan 18, 2021. FINDINGS: Stable cardiomediastinal silhouette. Minimal bibasilar subsegmental atelectasis is noted. Bony thorax is unremarkable. IMPRESSION: Minimal bibasilar subsegmental atelectasis. Electronically Signed   By: Lynwood Landy Raddle M.D.   On: 02/27/2024 18:34   CT ABDOMEN PELVIS W CONTRAST Result Date: 02/27/2024 CLINICAL DATA:  Abdominal pain, acute, nonlocalized low back, scrotal pain, fever, recent procedures. EXAM: CT ABDOMEN AND PELVIS WITH CONTRAST TECHNIQUE: Multidetector CT imaging of the abdomen and pelvis was performed using the standard protocol following bolus administration of intravenous contrast. RADIATION DOSE REDUCTION: This exam was performed according to the departmental dose-optimization program which includes automated exposure control, adjustment of the mA and/or kV according to patient size and/or use of iterative reconstruction technique. CONTRAST:  OMNIPAQUE  IOHEXOL  300 MG/ML  SOLN COMPARISON:  CT scan abdomen from 06/27/2023. FINDINGS: Lower chest: There are patchy atelectatic changes in the visualized lung bases. No overt consolidation. No pleural effusion. The heart is normal in size. No pericardial effusion. Hepatobiliary: The liver is normal in size.  Non-cirrhotic configuration. No suspicious mass. These is mild diffuse hepatic steatosis. No intrahepatic or extrahepatic bile duct dilation. No calcified gallstones. Normal gallbladder wall thickness. No pericholecystic inflammatory changes. Pancreas: Unremarkable. No pancreatic ductal dilatation or surrounding inflammatory changes. Spleen: Within normal limits. No focal lesion. Adrenals/Urinary Tract: There is a 1.8 x 2.4 cm the stable left adrenal adenoma. Unremarkable right adrenal gland. No suspicious renal mass. There is a partially exophytic 3.4 x 3.6 cm cyst arising from the right kidney upper pole, posteromedially. There additional subcentimeter sized hypoattenuating foci in bilateral kidneys, which are too small to adequately characterize. No hydroureteronephrosis or nephroureterolithiasis on either side. Urinary bladder is under distended, precluding optimal assessment. However, no large mass or stones identified. No perivesical fat stranding. Stomach/Bowel: There is a small diverticulum arising from the second part of duodenum. No disproportionate dilation of the small or large bowel loops. No evidence of abnormal bowel wall thickening or inflammatory changes. The appendix is unremarkable. Vascular/Lymphatic: No ascites or pneumoperitoneum. No abdominal or pelvic lymphadenopathy, by size criteria. No aneurysmal dilation of the major abdominal arteries. There are mild peripheral atherosclerotic vascular calcifications of the aorta and its major branches. Reproductive: Normal size prostate. Symmetric seminal vesicles. Other: There are small fat containing umbilical and right inguinal hernias. The soft tissues and abdominal wall are otherwise unremarkable. Musculoskeletal: No suspicious osseous lesions. There are mild multilevel degenerative changes in the visualized spine. IMPRESSION: 1. No acute inflammatory process identified within the abdomen or pelvis. 2. No nephroureterolithiasis or obstructive  uropathy. 3. Multiple other nonacute observations, as described above. Aortic Atherosclerosis (ICD10-I70.0). Electronically Signed   By: Ree Molt M.D.   On: 02/27/2024 16:06   Assessment/Plan Steven Steele is a 63 y.o. male with medical history significant for  HTN, OSA on CPAP, Guillain Barre, GERD, alpha gal allergy , arthritis, and allergies who presented to the ED for evaluation of fever and admitted for SIRS and possible sepsis of unknown etiology.   # SIRS # ?Sepsis of unknown source - Patient was recently treated UTI presented with fevers over the last 24 hours, no other symptoms - Patient found to be tachycardic with fever, max of 102.7 but no identified source of infection - Normal white count, UA negative, CXR with no signs of pneumonia, CT A/P with no intra-abdominal infection - Patient lethargic and acutely ill but at baseline mental status on exam - Start empirical antibiotics with IV vancomycin  and Rocephin  - Follow-up blood cultures, discontinue  antibiotics if negative - Trend CBC, fever curve  # BPH # Chronic voiding symptoms - Follows closely with urology, status post TURP in 07/2023 - Reported urinary symptoms for the last 3 to 4 weeks, recently treated for UTI with Bactrim - Repeat urinalysis does not show any evidence of urinary infection - Continue tamsulosin  and finasteride  - Follow-up with urology in the outpatient  # HTN - BP elevated with SBP in the 140s to 170s on admission slightly improved to the 110s to 130s - Continue amlodipine   # Allergies - Continue loratadine   # OSA - CPAP at bedtime  # Hx of Guillain Barr - Sensitive to pain at baseline but no acute symptoms - PT/OT eval  DVT prophylaxis: Xarelto     Code Status: Full Code  Consults called: None  Family Communication: Discussed admission with spouse at bedside  Severity of Illness: The appropriate patient status for this patient is OBSERVATION. Observation status is judged  to be reasonable and necessary in order to provide the required intensity of service to ensure the patient's safety. The patient's presenting symptoms, physical exam findings, and initial radiographic and laboratory data in the context of their medical condition is felt to place them at decreased risk for further clinical deterioration. Furthermore, it is anticipated that the patient will be medically stable for discharge from the hospital within 2 midnights of admission.   Level of care: Telemetry   This record has been created using Conservation officer, historic buildings. Errors have been sought and corrected, but may not always be located. Such creation errors do not reflect on the standard of care.   Lou Claretta HERO, MD 02/27/2024, 10:31 PM [facility] Pager: [phone] Isaiah 41:10   If 7PM-7AM, please contact night-coverage [email or web address] Password [email or web address]

## 2024-02-27 NOTE — ED Notes (Signed)
 Contacted IV team for ultrasound IV

## 2024-02-27 NOTE — ED Triage Notes (Signed)
 Pt reports with UTI symptoms for a while. Pt reports getting oral abts from his provider Monday but they aren't working and he was told to come get iv abts. Pt had a fever of 101.9 f at home today. Pt is having chills, lower back pain, and scrotum pain.

## 2024-02-27 NOTE — ED Provider Notes (Signed)
  Physical Exam  BP (!) 156/67   Pulse (!) 103   Temp (!) 102.7 F (39.3 C) (Oral)   Resp 18   Ht 5' 8 (1.727 m)   Wt 99.8 kg   SpO2 99%   BMI 33.45 kg/m   Physical Exam  Procedures  Procedures  ED Course / MDM    Medical Decision Making Amount and/or Complexity of Data Reviewed Labs: ordered. Radiology: ordered.  Risk OTC drugs. Prescription drug management.   Patient with fever.  Feeling bad.  UTI symptoms for a while.  Had positive E. coli culture and has been on Bactrim.  Had also some diarrhea.  Has a little bit of redness on belly.    Also had a tick bite a few weeks ago.  Continued fever.  Now up to 102.8 even after Tylenol .  CT scan reassuring.  Urine reassuring.  White count also reassuring.  Reassuring lactate.  However I think patient benefit from admission to the hospital.  History of alpha gal allergy .  Discussing with pharmacist on whether the oral Bactrim potentially could react to this.  Does have diarrhea which sometimes he gets after the event.        Patsey Lot, MD 02/27/24 2259

## 2024-02-27 NOTE — ED Provider Notes (Incomplete)
 [facility] EMERGENCY DEPARTMENT AT [facility] Provider Note   CSN: [id number] Arrival date & time: 02/27/24  1215     Patient presents with: UTI   Steven Steele is a 63 y.o. male.  {Add pertinent medical, surgical, social history, OB history to HPI:32947} HPI     Prior to Admission medications   Medication Sig Start Date End Date Taking? Authorizing Provider  amLODipine  (NORVASC ) 10 MG tablet Take 10 mg by mouth in the morning. 11/03/20   [provider]  amoxicillin -clavulanate (AUGMENTIN ) 875-125 MG tablet Take 1 tablet by mouth 2 (two) times daily. 12/31/23   Carolee Sherwood JONETTA DOUGLAS, MD  cetirizine (ZYRTEC) 10 MG tablet Take 10 mg by mouth at bedtime.    [provider]  clobetasol cream (TEMOVATE) 0.05 % Apply 1 Application topically 2 (two) times daily as needed (eczema). 12/03/23   [provider]  EPINEPHrine  0.3 mg/0.3 mL IJ SOAJ injection Inject 0.3 mg into the muscle as needed for anaphylaxis. 06/06/16   [provider]  finasteride  (PROSCAR ) 5 MG tablet Take 5 mg by mouth in the morning.    [provider]  HYDROcodone -acetaminophen  (NORCO/VICODIN) 5-325 MG tablet Take 1 tablet by mouth every 6 (six) hours as needed. 12/31/23   Carolee Sherwood JONETTA DOUGLAS, MD  tamsulosin  (FLOMAX ) 0.4 MG CAPS capsule Take 0.4 mg by mouth in the morning.    [provider]    Allergies: Steven Steele allergy ], Alpha-gal, Beef-derived drug products, and Pork allergy     Review of Systems  Updated Vital Signs BP (!) 170/94 (BP Location: Right Arm)   Pulse (!) 102   Temp 99.6 F (37.6 C) (Oral)   Resp 14   Ht 5' 8 (1.727 m)   Wt 99.8 kg   SpO2 98%   BMI 33.45 kg/m   Physical Exam  (all labs ordered are listed, but only abnormal results are displayed) Labs Reviewed  COMPREHENSIVE METABOLIC PANEL WITH GFR - Abnormal; Notable for the following components:      Result Value   Potassium 3.4 (*)    CO2 21 (*)    Glucose,  Bld 124 (*)    All other components within normal limits  CULTURE, BLOOD (ROUTINE X 2)  CULTURE, BLOOD (ROUTINE X 2)  URINALYSIS, ROUTINE W REFLEX MICROSCOPIC  CBC WITH DIFFERENTIAL/PLATELET  I-STAT CG4 LACTIC ACID, ED  I-STAT CG4 LACTIC ACID, ED    EKG: None  Radiology: No results found.  {Document cardiac monitor, telemetry assessment procedure when appropriate:32947} Procedures   Medications Ordered in the ED  HYDROmorphone  (DILAUDID ) injection 1 mg (1 mg Intravenous Given 02/27/24 1354)      {Click here for ABCD2, HEART and other calculators REFRESH Note before signing:1}                              Medical Decision Making Amount and/or Complexity of Data Reviewed Labs: ordered.  Risk Prescription drug management.   ***  {Document critical care time when appropriate  Document review of labs and clinical decision tools ie CHADS2VASC2, etc  Document your independent review of radiology images and any outside records  Document your discussion with family members, caretakers and with consultants  Document social determinants of health affecting pt's care  Document your decision making why or why not admission, treatments were needed:32947:::1}   Final diagnoses:  None    ED Discharge Orders     None

## 2024-02-28 DIAGNOSIS — Z833 Family history of diabetes mellitus: Secondary | ICD-10-CM | POA: Diagnosis not present

## 2024-02-28 DIAGNOSIS — I10 Essential (primary) hypertension: Secondary | ICD-10-CM | POA: Diagnosis present

## 2024-02-28 DIAGNOSIS — R6510 Systemic inflammatory response syndrome (SIRS) of non-infectious origin without acute organ dysfunction: Secondary | ICD-10-CM | POA: Diagnosis not present

## 2024-02-28 DIAGNOSIS — A419 Sepsis, unspecified organism: Secondary | ICD-10-CM | POA: Diagnosis present

## 2024-02-28 DIAGNOSIS — R21 Rash and other nonspecific skin eruption: Secondary | ICD-10-CM | POA: Diagnosis present

## 2024-02-28 DIAGNOSIS — I447 Left bundle-branch block, unspecified: Secondary | ICD-10-CM | POA: Diagnosis present

## 2024-02-28 DIAGNOSIS — F17290 Nicotine dependence, other tobacco product, uncomplicated: Secondary | ICD-10-CM | POA: Diagnosis present

## 2024-02-28 DIAGNOSIS — K219 Gastro-esophageal reflux disease without esophagitis: Secondary | ICD-10-CM | POA: Diagnosis present

## 2024-02-28 DIAGNOSIS — Z9079 Acquired absence of other genital organ(s): Secondary | ICD-10-CM | POA: Diagnosis not present

## 2024-02-28 DIAGNOSIS — R509 Fever, unspecified: Secondary | ICD-10-CM | POA: Diagnosis not present

## 2024-02-28 DIAGNOSIS — G4733 Obstructive sleep apnea (adult) (pediatric): Secondary | ICD-10-CM | POA: Diagnosis present

## 2024-02-28 DIAGNOSIS — Z1152 Encounter for screening for COVID-19: Secondary | ICD-10-CM | POA: Diagnosis not present

## 2024-02-28 DIAGNOSIS — Z91014 Allergy to mammalian meats: Secondary | ICD-10-CM | POA: Diagnosis not present

## 2024-02-28 DIAGNOSIS — N400 Enlarged prostate without lower urinary tract symptoms: Secondary | ICD-10-CM | POA: Diagnosis present

## 2024-02-28 DIAGNOSIS — E66811 Obesity, class 1: Secondary | ICD-10-CM | POA: Diagnosis present

## 2024-02-28 DIAGNOSIS — Z6833 Body mass index (BMI) 33.0-33.9, adult: Secondary | ICD-10-CM | POA: Diagnosis not present

## 2024-02-28 DIAGNOSIS — Z91013 Allergy to seafood: Secondary | ICD-10-CM | POA: Diagnosis not present

## 2024-02-28 DIAGNOSIS — G610 Guillain-Barre syndrome: Secondary | ICD-10-CM | POA: Diagnosis present

## 2024-02-28 DIAGNOSIS — N390 Urinary tract infection, site not specified: Secondary | ICD-10-CM | POA: Diagnosis present

## 2024-02-28 DIAGNOSIS — R32 Unspecified urinary incontinence: Secondary | ICD-10-CM | POA: Diagnosis present

## 2024-02-28 LAB — BASIC METABOLIC PANEL WITH GFR
Anion gap: 9 (ref 5–15)
BUN: 14 mg/dL (ref 8–23)
CO2: 18 mmol/L — ABNORMAL LOW (ref 22–32)
Calcium: 8.5 mg/dL — ABNORMAL LOW (ref 8.9–10.3)
Chloride: 104 mmol/L (ref 98–111)
Creatinine, Ser: 0.96 mg/dL (ref 0.61–1.24)
GFR, Estimated: >60 mL/min (ref >60)
Glucose, Bld: 114 mg/dL — ABNORMAL HIGH (ref 70–99)
Potassium: 3.5 mmol/L (ref 3.5–5.1)
Sodium: 131 mmol/L — ABNORMAL LOW (ref 135–145)

## 2024-02-28 LAB — CBC
HCT: 39.3 % (ref 39.0–52.0)
Hemoglobin: 12.9 g/dL — ABNORMAL LOW (ref 13.0–17.0)
MCH: 27.9 pg (ref 26.0–34.0)
MCHC: 32.8 g/dL (ref 30.0–36.0)
MCV: 84.9 fL (ref 80.0–100.0)
Platelets: 164 K/uL (ref 150–400)
RBC: 4.63 MIL/uL (ref 4.22–5.81)
RDW: 13.4 % (ref 11.5–15.5)
WBC: 5.5 K/uL (ref 4.0–10.5)
nRBC: 0 % (ref 0.0–0.2)

## 2024-02-28 LAB — HIV ANTIBODY (ROUTINE TESTING W REFLEX): HIV Screen 4th Generation wRfx: NONREACTIVE

## 2024-02-28 MED ORDER — SENNA 8.6 MG PO TABS
1.0000 | ORAL_TABLET | Freq: Once | ORAL | Status: AC
  Administered 2024-02-28: 8.6 mg via ORAL
  Filled 2024-02-28: qty 1

## 2024-02-28 MED ORDER — RIVAROXABAN 10 MG PO TABS
10.0000 mg | ORAL_TABLET | ORAL | Status: DC
  Administered 2024-02-28: 10 mg via ORAL
  Filled 2024-02-28: qty 1

## 2024-02-28 MED ORDER — VANCOMYCIN HCL 750 MG/150ML IV SOLN
750.0000 mg | Freq: Two times a day (BID) | INTRAVENOUS | Status: DC
  Administered 2024-02-28 – 2024-02-29 (×3): 750 mg via INTRAVENOUS
  Filled 2024-02-28 (×3): qty 150

## 2024-02-28 NOTE — Evaluation (Signed)
 Occupational Therapy Evaluation Patient Details Name: Steven Steele MRN: [id number] DOB: 02/08/1961 Today's Date: 02/28/2024   History of Present Illness   Steven Steele is a 63 year old male who presented to ED with fever after being treated for UTI. Admitted for treatment  for fever, r/o sepsis/SIR,  PMH: TURP 20204, BPH, HTN, OSA on CPAP, Guillain Barre, GERD, alpha gal allergy , arthritis, and allergies     Clinical Impressions Patient evaluated by Occupational Therapy with no further acute OT needs identified. All education has been completed and the patient has no further questions.  See below for any follow-up Occupational Therapy or equipment needs. OT is signing off. Thank you for this referral.      If plan is discharge home, recommend the following:   Help with stairs or ramp for entrance;Assist for transportation     Functional Status Assessment   Patient has not had a recent decline in their functional status     Equipment Recommendations   None recommended by OT      Precautions/Restrictions   Precautions Precautions: Fall     Mobility Bed Mobility Overal bed mobility: Independent                  Transfers Overall transfer level: Independent                        Balance Overall balance assessment: No apparent balance deficits (not formally assessed)                                         ADL either performed or assessed with clinical judgement   ADL Overall ADL's : Independent                                       General ADL Comments: able to demonstrate LB self care and standing level toileting and sink side grooming with indep level     Vision Baseline Vision/History: 0 No visual deficits Ability to See in Adequate Light: 0 Adequate Patient Visual Report: No change from baseline Vision Assessment?: No apparent visual deficits            Pertinent Vitals/Pain Pain  Assessment Pain Assessment: No/denies pain     Extremity/Trunk Assessment Upper Extremity Assessment Upper Extremity Assessment: Right hand dominant;Overall WFL for tasks assessed   Lower Extremity Assessment Lower Extremity Assessment: Defer to PT evaluation   Cervical / Trunk Assessment Cervical / Trunk Assessment: Normal   Communication Communication Communication: No apparent difficulties   Cognition Arousal: Alert Behavior During Therapy: WFL for tasks assessed/performed, Flat affect (mildly low frustration tolerance) Cognition: No apparent impairments             OT - Cognition Comments: reports mildy slower processing during fevers otherwise no issues or concerns noted                 Following commands: Intact       Cueing  General Comments   Cueing Techniques: Verbal cues  no SOB, edema or skin issues           Home Living Family/patient expects to be discharged to:: Private residence Living Arrangements: Spouse/significant other Available Help at Discharge: Family;Available PRN/intermittently Type of Home: House Home Access: Stairs to enter Entrance  Stairs-Number of Steps: 12 Entrance Stairs-Rails: Left;Right Home Layout: Two level;Full bath on main level     Bathroom Shower/Tub: Tub/shower unit;Door   Teacher, early years/pre: Yes How Accessible: Accessible via walker Home Equipment: None   Additional Comments: wife works outside the home      Prior Functioning/Environment Prior Level of Function : Independent/Modified Independent;Driving             Mobility Comments: independent ADLs Comments: independnet     AM-PAC OT 6 Clicks Daily Activity     Outcome Measure Help from another person eating meals?: None Help from another person taking care of personal grooming?: None Help from another person toileting, which includes using toliet, bedpan, or urinal?: None Help from another person bathing  (including washing, rinsing, drying)?: None Help from another person to put on and taking off regular upper body clothing?: None Help from another person to put on and taking off regular lower body clothing?: None 6 Click Score: 24   End of Session Equipment Utilized During Treatment: Gait belt Nurse Communication: Mobility status  Activity Tolerance: Patient tolerated treatment well Patient left: in bed;with call bell/phone within reach                   Time: 0925-0945 OT Time Calculation (min): 20 min Charges:  OT General Charges $OT Visit: 1 Visit OT Evaluation $OT Eval Low Complexity: 1 Low  Ibrahem Volkman OT/L Acute Rehabilitation Department  [phone]  02/28/2024, 10:28 AM

## 2024-02-28 NOTE — Progress Notes (Signed)
   02/28/24 2238  BiPAP/CPAP/SIPAP  BiPAP/CPAP/SIPAP Pt Type Adult  BiPAP/CPAP/SIPAP DREAMSTATIOND  Mask Type Full face mask  Dentures removed? Not applicable  Mask Size Large  Patient Home Machine No  Patient Home Mask No  Patient Home Tubing No  Auto Titrate Yes  Minimum cmH2O 7 cmH2O  Maximum cmH2O 20 cmH2O  Device Plugged into [employer] Yes

## 2024-02-28 NOTE — Plan of Care (Signed)

## 2024-02-28 NOTE — Evaluation (Signed)
 Physical Therapy Brief Evaluation and Discharge Note Patient Details Name: Steven Steele MRN: [id number] DOB: 05-26-1961 Today's Date: 02/28/2024   History of Present Illness  Jaeson Molstad is a 63 year old male who presented to ED with fever after being treated for UTI. Admitted for treatment  for fever, r/o sepsis/SIR,  PMH: TURP 20204, BPH, HTN, OSA on CPAP, Guillain Barre, GERD, alpha gal allergy , arthritis, and allergies  Clinical Impression  Pt admitted with above diagnosis. Pt in bed, agreeable to session. States that he has been up and using the restroom, reports fluctuation in fever which when temp high he notes feeling off. Pt wife works and he will be home alone for portion of the day. Pt currently functioning at baseline level of function, no further PT needs identified at this time, please place consult should needs arise.          PT Assessment Patient does not need any further PT services  Assistance Needed at Discharge  None    Equipment Recommendations None recommended by PT  Recommendations for Other Services       Precautions/Restrictions Precautions Precautions: Fall Recall of Precautions/Restrictions: Intact Restrictions Weight Bearing Restrictions Per Provider Order: No        Mobility  Bed Mobility Rolling: Independent Supine/Sidelying to sit: Independent Sit to supine/sidelying: Independent    Transfers Overall transfer level: Independent                      Ambulation/Gait Ambulation/Gait assistance: Independent Gait Distance (Feet): 400 Feet Assistive device: None   Gait Speed: Pace WFL General Gait Details: normalized gait pattern no noted deficits, velocity WNL and good sequencing, directional changes and the ability to adapt to elevation changes without hesitation (amb-stairs-amb)  Home Activity Instructions Home Activity Instructions: when feeling off, take increased time to complete tasks and plan to avoid  falls  Stairs Stairs: Yes Stairs assistance: Independent Stair Management: No rails Number of Stairs: 10 General stair comments: no deficits noted with negotiation of stairs  Modified Rankin (Stroke Patients Only)        Balance Overall balance assessment: Independent                        Pertinent Vitals/Pain PT - Brief Vital Signs All Vital Signs Stable: Yes Pain Assessment Pain Assessment: No/denies pain     Home Living Family/patient expects to be discharged to:: Private residence Living Arrangements: Spouse/significant other Available Help at Discharge: Family;Available PRN/intermittently (wife works) Home Environment: Stairs to enter;Rail - right;Rail - left  Stairs-Number of Steps: 12 (has option of either 3 or 12 steps, both with bilateral hand rails) Home Equipment: None   Additional Comments: wife works outside the home    Prior Function Level of Independence: Independent      UE/LE Assessment   UE ROM/Strength/Tone/Coordination: [employer]    LE ROM/Strength/Tone/Coordination: [facility]      Communication   Communication Communication: No apparent difficulties     Cognition Overall Cognitive Status: Appears within functional limits for tasks assessed/performed       General Comments      Exercises     Assessment/Plan    PT Problem List         PT Visit Diagnosis Difficulty in walking, not elsewhere classified (R26.2)    No Skilled PT All education completed;Patient at baseline level of functioning;Patient is independent with all acitivity/mobility   Co-evaluation  AMPAC 6 Clicks Help needed turning from your back to your side while in a flat bed without using bedrails?: None Help needed moving from lying on your back to sitting on the side of a flat bed without using bedrails?: None Help needed moving to and from a bed to a chair (including a wheelchair)?: None Help needed standing up from a chair using your  arms (e.g., wheelchair or bedside chair)?: None Help needed to walk in hospital room?: None Help needed climbing 3-5 steps with a railing? : None 6 Click Score: 24      End of Session Equipment Utilized During Treatment: Gait belt Activity Tolerance: Patient tolerated treatment well Patient left: in chair;with call bell/phone within reach Nurse Communication: Mobility status PT Visit Diagnosis: Difficulty in walking, not elsewhere classified (R26.2)     Time: 9041-8991 PT Time Calculation (min) (ACUTE ONLY): 10 min  Charges:   PT Evaluation $PT Eval Low Complexity: 1 Low      Stann, PT Acute Rehabilitation Services Office: [phone] 02/28/2024   Stann DELENA Ohara  02/28/2024, 10:16 AM

## 2024-02-28 NOTE — Progress Notes (Signed)
 Progress Note   Patient: Steven Steele FMW:[id number] DOB: June 26, 1961 DOA: 02/27/2024     0 DOS: the patient was seen and examined on 02/28/2024   Brief hospital course: 63 y.o. male with medical history significant for HTN, OSA on CPAP, Guillain Barre, GERD, alpha gal allergy , arthritis, and allergies who presented to the ED for evaluation of fever.  Patient reports that he has had a TURP procedure in December 2024.  Has had intermittent difficulty voiding with urethral pain since then. Over the last 3 to 4 weeks, he has had dysuria urinary frequency/urgency and splitting of the urinary stream.  She was seen by his urologist on Monday and was prescribed Bactrim for urinary tract infection. He woke up this morning with a fever of 101.9 so he called his urologist who informed him to present to the ED for IV antibiotics. Reports his urinary symptoms have improved but he feels lethargic and just not feeling well. He endorsed 1 watery stool during this week but denies any headache, lightheadedness, nausea, vomiting, abdominal pain, chest pain, shortness of breath or hematuria.  He does report removing a tick from between his toes 3 weeks ago.   ED Course: Initial vitals show temp 99.6->102.7, HR 102, BP 170/79, SpO2 98% on room air. Initial labs significant for normal CBC, K+ 3.4, glucose 124, normal kidney function, normal LFTs, normal lactic acid, negative COVID, flu and RSV test, UA with no signs of infection.  EKG shows sinus rhythm with LBBB. CXR shows no active disease. CT A/P shows no intra-abdominal or pelvic pathology. Pt received Tylenol  500 mg x 1, and IV Dilaudid  1 mg x 1. TRH was consulted for admission.  Assessment and Plan: # SIRS # ?Sepsis of unknown source - Patient was recently treated UTI presented with fevers over the last 24 hours, no other symptoms - Patient found to be tachycardic with fever, max of 102.7 but no identified source of infection - Normal white count, UA  negative, CXR with no signs of pneumonia, CT A/P with no intra-abdominal infection - on empirical antibiotics with IV vancomycin  and Rocephin  - Blood cx pending, thus far neg - Trend CBC, fever curve   # BPH # Chronic voiding symptoms - Follows closely with urology, status post TURP in 07/2023 - Reported urinary symptoms for the last 3 to 4 weeks, recently treated for UTI with Bactrim - Repeat urinalysis does not show any evidence of urinary infection, however pt had been taking bactrim at the time of urinalysis - Continue tamsulosin  and finasteride  - Follow-up with urology in the outpatient   # HTN - BP elevated with SBP in the 140s to 170s on admission - Continue amlodipine    # Allergies - Continue loratadine    # OSA - CPAP at bedtime   # Hx of Guillain Barr - Sensitive to pain at baseline but no acute symptoms - PT/OT eval      Subjective: Reports feeling better today  Physical Exam: Vitals:   02/28/24 0347 02/28/24 0500 02/28/24 0921 02/28/24 1206  BP: 119/67   131/71  Pulse: 97   86  Resp: 18   14  Temp: (!) 100.5 F (38.1 C) 99 F (37.2 C) 99.1 F (37.3 C) 98.9 F (37.2 C)  TempSrc: Oral Oral Oral Oral  SpO2: 94%   100%  Weight:      Height:       General exam: Awake, laying in bed, in nad Respiratory system: Normal respiratory effort, no wheezing Cardiovascular system:  regular rate, s1, s2 Gastrointestinal system: Soft, nondistended, positive BS Central nervous system: CN2-12 grossly intact, strength intact Extremities: Perfused, no clubbing Skin: Normal skin turgor, no notable skin lesions seen Psychiatry: Mood normal // no visual hallucinations   Data Reviewed:  Labs reviewed: Na 131, K 3.5, Cr 0.96, WBC 5.5, Hgb 12.9, Plts 164  Family Communication: Pt in room, family not at bedside  Disposition: Status is: Observation The patient will require care spanning > 2 midnights and should be moved to inpatient because: severity of illness  Planned  Discharge Destination: Home    Garnette Pelt, MD 02/28/2024 5:54 PM  For on call review [email or web address].

## 2024-02-28 NOTE — Progress Notes (Signed)
 Received message from secretary the pt's wife called for updates. The patient gave RN permission to speak with his wife. RN called wife at the beside and left voicemail for [place] to return call.

## 2024-02-28 NOTE — Progress Notes (Signed)
 Pharmacy Antibiotic Note  Steven Steele is a 63 y.o. male admitted on 02/27/2024 with sepsis and UTI.  Pharmacy has been consulted for Vancomycin  dosing.  Today, 02/28/2024: SCr improved slightly BCx remain clear WBC WNL since admission Continues to spike fevers despite abx UA not c/w with infection, and no further urinary symptoms  Plan: Continue Ceftriaxone  per MD; dose appropriate Increase vancomycin  to 750 mg IV q12 hr with improved renal function (eAUC 431 w/ SCr 0.96; Vd 0.5)   Height: 5' 8 (172.7 cm) Weight: 99.8 kg (220 lb) IBW/kg (Calculated) : 68.4  Temp (24hrs), Avg:100.2 F (37.9 C), Min:98.9 F (37.2 C), Max:102.8 F (39.3 C)  Recent Labs  Lab 02/27/24 1240 02/27/24 1348 02/28/24 0511  WBC 6.1  --  5.5  CREATININE 1.12  --  0.96  LATICACIDVEN  --  1.4  --     Estimated Creatinine Clearance: 92.6 mL/min (by C-G formula based on SCr of 0.96 mg/dL).    Allergies  Allergen Reactions   Crab [Shellfish Allergy ] Anaphylaxis    Alpha gal   Alpha-Gal Other (See Comments)   Beef-Derived Drug Products     HAS ALPHA-GAL    Pork Allergy  Other (See Comments)    GI issues (resulted in hospitalization) ALPHA GAL    Antimicrobials this admission: 7/9 Ceftriaxone  >> 7/9 Vancomycin  >>   Dose adjustments this admission: 7/10: incr vanc 1500 q24 >> 750 q12 with improved SCr  Microbiology results: 7/9 BCx: ngtd  Thank you for allowing pharmacy to be a part of this patient's care.  Bard Jeans, PharmD, BCPS [phone] 02/28/2024, 12:43 PM

## 2024-02-28 NOTE — Progress Notes (Signed)
   02/28/24 0839  TOC Brief Assessment  Insurance and Status Reviewed  Patient has primary care physician Yes  Home environment has been reviewed Resides in single family home with spouse  Prior level of function: Independent with ADLs at baseline  Prior/Current Home Services No current home services  Social Drivers of Health Review SDOH reviewed no interventions necessary  Readmission risk has been reviewed Yes  Transition of care needs no transition of care needs at this time

## 2024-02-28 NOTE — Progress Notes (Signed)
 Patient may be early discharge Friday.

## 2024-02-28 NOTE — Progress Notes (Signed)
 Pt's spouse Burnard returned my call. Update given as requested. All questions answered.

## 2024-02-28 NOTE — Hospital Course (Signed)
 63 y.o. male with medical history significant for HTN, OSA on CPAP, Guillain Barre, GERD, alpha gal allergy , arthritis, and allergies who presented to the ED for evaluation of fever.  Patient reports that he has had a TURP procedure in December 2024.  Has had intermittent difficulty voiding with urethral pain since then. Over the last 3 to 4 weeks, he has had dysuria urinary frequency/urgency and splitting of the urinary stream.  She was seen by his urologist on Monday and was prescribed Bactrim for urinary tract infection. He woke up this morning with a fever of 101.9 so he called his urologist who informed him to present to the ED for IV antibiotics. Reports his urinary symptoms have improved but he feels lethargic and just not feeling well. He endorsed 1 watery stool during this week but denies any headache, lightheadedness, nausea, vomiting, abdominal pain, chest pain, shortness of breath or hematuria.  He does report removing a tick from between his toes 3 weeks ago.   ED Course: Initial vitals show temp 99.6->102.7, HR 102, BP 170/79, SpO2 98% on room air. Initial labs significant for normal CBC, K+ 3.4, glucose 124, normal kidney function, normal LFTs, normal lactic acid, negative COVID, flu and RSV test, UA with no signs of infection.  EKG shows sinus rhythm with LBBB. CXR shows no active disease. CT A/P shows no intra-abdominal or pelvic pathology. Pt received Tylenol  500 mg x 1, and IV Dilaudid  1 mg x 1. TRH was consulted for admission.

## 2024-02-29 ENCOUNTER — Other Ambulatory Visit (HOSPITAL_COMMUNITY): Payer: Self-pay

## 2024-02-29 DIAGNOSIS — R509 Fever, unspecified: Secondary | ICD-10-CM | POA: Diagnosis not present

## 2024-02-29 DIAGNOSIS — R6510 Systemic inflammatory response syndrome (SIRS) of non-infectious origin without acute organ dysfunction: Secondary | ICD-10-CM | POA: Diagnosis not present

## 2024-02-29 LAB — COMPREHENSIVE METABOLIC PANEL WITH GFR
ALT: 41 U/L (ref 0–44)
AST: 37 U/L (ref 15–41)
Albumin: 3.5 g/dL (ref 3.5–5.0)
Alkaline Phosphatase: 66 U/L (ref 38–126)
Anion gap: 8 (ref 5–15)
BUN: 15 mg/dL (ref 8–23)
CO2: 20 mmol/L — ABNORMAL LOW (ref 22–32)
Calcium: 8.8 mg/dL — ABNORMAL LOW (ref 8.9–10.3)
Chloride: 110 mmol/L (ref 98–111)
Creatinine, Ser: 0.81 mg/dL (ref 0.61–1.24)
GFR, Estimated: >60 mL/min (ref >60)
Glucose, Bld: 118 mg/dL — ABNORMAL HIGH (ref 70–99)
Potassium: 3.7 mmol/L (ref 3.5–5.1)
Sodium: 138 mmol/L (ref 135–145)
Total Bilirubin: 0.5 mg/dL (ref 0.0–1.2)
Total Protein: 6.4 g/dL — ABNORMAL LOW (ref 6.5–8.1)

## 2024-02-29 LAB — CBC
HCT: 39.6 % (ref 39.0–52.0)
Hemoglobin: 13.1 g/dL (ref 13.0–17.0)
MCH: 28.2 pg (ref 26.0–34.0)
MCHC: 33.1 g/dL (ref 30.0–36.0)
MCV: 85.2 fL (ref 80.0–100.0)
Platelets: 157 K/uL (ref 150–400)
RBC: 4.65 MIL/uL (ref 4.22–5.81)
RDW: 13.3 % (ref 11.5–15.5)
WBC: 5.4 K/uL (ref 4.0–10.5)
nRBC: 0 % (ref 0.0–0.2)

## 2024-02-29 MED ORDER — CEFUROXIME AXETIL 500 MG PO TABS
500.0000 mg | ORAL_TABLET | Freq: Two times a day (BID) | ORAL | 0 refills | Status: AC
  Filled 2024-02-29: qty 8, 4d supply, fill #0

## 2024-02-29 NOTE — Plan of Care (Signed)
  Problem: Education: Goal: Knowledge of General Education information will improve Description: Including pain rating scale, medication(s)/side effects and non-pharmacologic comfort measures 02/29/2024 1239 by Rosanne Elspeth HERO, RN Outcome: Adequate for Discharge 02/29/2024 1002 by Rosanne Elspeth HERO, RN Outcome: Progressing   Problem: Health Behavior/Discharge Planning: Goal: Ability to manage health-related needs will improve 02/29/2024 1239 by Rosanne Elspeth HERO, RN Outcome: Adequate for Discharge 02/29/2024 1002 by Rosanne Elspeth HERO, RN Outcome: Progressing   Problem: Clinical Measurements: Goal: Ability to maintain clinical measurements within normal limits will improve 02/29/2024 1239 by Rosanne Elspeth HERO, RN Outcome: Adequate for Discharge 02/29/2024 1002 by Rosanne Elspeth HERO, RN Outcome: Progressing Goal: Will remain free from infection 02/29/2024 1239 by Rosanne Elspeth HERO, RN Outcome: Adequate for Discharge 02/29/2024 1002 by Rosanne Elspeth HERO, RN Outcome: Progressing Goal: Diagnostic test results will improve 02/29/2024 1239 by Rosanne Elspeth HERO, RN Outcome: Adequate for Discharge 02/29/2024 1002 by Rosanne Elspeth HERO, RN Outcome: Progressing Goal: Respiratory complications will improve 02/29/2024 1239 by Rosanne Elspeth HERO, RN Outcome: Adequate for Discharge 02/29/2024 1002 by Rosanne Elspeth HERO, RN Outcome: Progressing Goal: Cardiovascular complication will be avoided 02/29/2024 1239 by Rosanne Elspeth HERO, RN Outcome: Adequate for Discharge 02/29/2024 1002 by Rosanne Elspeth HERO, RN Outcome: Progressing   Problem: Activity: Goal: Risk for activity intolerance will decrease 02/29/2024 1239 by Rosanne Elspeth HERO, RN Outcome: Adequate for Discharge 02/29/2024 1002 by Rosanne Elspeth HERO, RN Outcome: Progressing   Problem: Nutrition: Goal: Adequate nutrition will be maintained 02/29/2024 1239 by Rosanne Elspeth HERO, RN Outcome:  Adequate for Discharge 02/29/2024 1002 by Rosanne Elspeth HERO, RN Outcome: Progressing   Problem: Coping: Goal: Level of anxiety will decrease 02/29/2024 1239 by Rosanne Elspeth HERO, RN Outcome: Adequate for Discharge 02/29/2024 1002 by Rosanne Elspeth HERO, RN Outcome: Progressing   Problem: Elimination: Goal: Will not experience complications related to bowel motility 02/29/2024 1239 by Rosanne Elspeth HERO, RN Outcome: Adequate for Discharge 02/29/2024 1002 by Rosanne Elspeth HERO, RN Outcome: Progressing Goal: Will not experience complications related to urinary retention 02/29/2024 1239 by Rosanne Elspeth HERO, RN Outcome: Adequate for Discharge 02/29/2024 1002 by Rosanne Elspeth HERO, RN Outcome: Progressing   Problem: Pain Managment: Goal: General experience of comfort will improve and/or be controlled 02/29/2024 1239 by Rosanne Elspeth HERO, RN Outcome: Adequate for Discharge 02/29/2024 1002 by Rosanne Elspeth HERO, RN Outcome: Progressing   Problem: Safety: Goal: Ability to remain free from injury will improve 02/29/2024 1239 by Rosanne Elspeth HERO, RN Outcome: Adequate for Discharge 02/29/2024 1002 by Rosanne Elspeth HERO, RN Outcome: Progressing   Problem: Skin Integrity: Goal: Risk for impaired skin integrity will decrease 02/29/2024 1239 by Rosanne Elspeth HERO, RN Outcome: Adequate for Discharge 02/29/2024 1002 by Rosanne Elspeth HERO, RN Outcome: Progressing

## 2024-02-29 NOTE — Plan of Care (Signed)

## 2024-02-29 NOTE — Discharge Summary (Signed)
 Physician Discharge Summary   Patient: Steven Steele MRN: [id number] DOB: [birth year]  Admit date:     02/27/2024  Discharge date: 02/29/24  Discharge Physician: Garnette Pelt   PCP: Kip Righter, MD   Recommendations at discharge:    Follow up with PCP in 1-2 weeks Follow up with Urology as scheduled  Discharge Diagnoses: Principal Problem:   SIRS (systemic inflammatory response syndrome) (HCC) Active Problems:   Fever   Sepsis (HCC)  Resolved Problems:   * No resolved hospital problems. *  Hospital Course: 63 y.o. male with medical history significant for HTN, OSA on CPAP, Guillain Barre, GERD, alpha gal allergy , arthritis, and allergies who presented to the ED for evaluation of fever.  Patient reports that he has had a TURP procedure in December 2024.  Has had intermittent difficulty voiding with urethral pain since then. Over the last 3 to 4 weeks, he has had dysuria urinary frequency/urgency and splitting of the urinary stream.  She was seen by his urologist on Monday and was prescribed Bactrim for urinary tract infection. He woke up this morning with a fever of 101.9 so he called his urologist who informed him to present to the ED for IV antibiotics. Reports his urinary symptoms have improved but he feels lethargic and just not feeling well. He endorsed 1 watery stool during this week but denies any headache, lightheadedness, nausea, vomiting, abdominal pain, chest pain, shortness of breath or hematuria.  He does report removing a tick from between his toes 3 weeks ago.   ED Course: Initial vitals show temp 99.6->102.7, HR 102, BP 170/79, SpO2 98% on room air. Initial labs significant for normal CBC, K+ 3.4, glucose 124, normal kidney function, normal LFTs, normal lactic acid, negative COVID, flu and RSV test, UA with no signs of infection.  EKG shows sinus rhythm with LBBB. CXR shows no active disease. CT A/P shows no intra-abdominal or pelvic pathology. Pt received Tylenol   500 mg x 1, and IV Dilaudid  1 mg x 1. TRH was consulted for admission.  Assessment and Plan: # Sepsis likely secondary to urinary source present on admit - Patient was recently treated UTI presented with fevers over the last 24 hours, no other symptoms - Patient found to be tachycardic with fever, max of 102.7 - Normal white count, UA negative, CXR with no signs of pneumonia, CT A/P with no intra-abdominal infection -given recent urologic procedure, suspect urinary source of infection -Urine cx neg - on empirical Rocephin  with clinical improvement and resolution of fevers -Discussed with ID pharmacy. Rec to d/c on cefuroxime  to complete course   # BPH # Chronic voiding symptoms - Follows closely with urology, status post TURP in 07/2023 - Reported urinary symptoms for the last 3 to 4 weeks, recently treated for UTI with Bactrim - Repeat urinalysis does not show any evidence of urinary infection, however pt had been taking bactrim at the time of urinalysis - Continue tamsulosin  and finasteride  - Follow-up with urology in the outpatient   # HTN - BP elevated with SBP in the 140s to 170s on admission - Continue amlodipine    # Allergies - Continue loratadine    # OSA - CPAP at bedtime   # Hx of Guillain Barr - Sensitive to pain at baseline but no acute symptoms    Consultants:  Procedures performed:   Disposition: Home Diet recommendation:  Regular diet DISCHARGE MEDICATION: Allergies as of 02/29/2024       Reactions   Crab [shellfish Allergy ] Anaphylaxis  Alpha gal   Alpha-gal Other (See Comments)   Beef-derived Drug Products    HAS ALPHA-GAL    Pork Allergy  Other (See Comments)   GI issues (resulted in hospitalization) ALPHA GAL        Medication List     STOP taking these medications    amoxicillin -clavulanate 875-125 MG tablet Commonly known as: AUGMENTIN    clobetasol cream 0.05 % Commonly known as: TEMOVATE   HYDROcodone -acetaminophen  5-325 MG  tablet Commonly known as: NORCO/VICODIN   hydrocortisone 2.5 % rectal cream Commonly known as: ANUSOL-HC   meloxicam 15 MG tablet Commonly known as: MOBIC   sulfamethoxazole-trimethoprim 800-160 MG tablet Commonly known as: BACTRIM DS       TAKE these medications    amLODipine  10 MG tablet Commonly known as: NORVASC  Take 10 mg by mouth in the morning.   cefUROXime  500 MG tablet Commonly known as: CEFTIN  Take 1 tablet (500 mg total) by mouth 2 (two) times daily with a meal for 4 days.   cetirizine 10 MG tablet Commonly known as: ZYRTEC Take 10 mg by mouth at bedtime.   EPINEPHrine  0.3 mg/0.3 mL Soaj injection Commonly known as: EPI-PEN Inject 0.3 mg into the muscle as needed for anaphylaxis.   finasteride  5 MG tablet Commonly known as: PROSCAR  Take 5 mg by mouth in the morning.   tamsulosin  0.4 MG Caps capsule Commonly known as: FLOMAX  Take 0.4 mg by mouth in the morning.        Follow-up Information     Kip Righter, MD Follow up in 2 week(s).   Specialty: Family Medicine Why: Hospital follow up Contact information: [address] [place] [address] [phone]         Carolee Sherwood JONETTA DOUGLAS, MD Follow up.   Specialty: Urology Why: as scheduled Contact information: [address] [place] [address] [phone]                Discharge Exam: Fredricka Weights   02/27/24 1221  Weight: 99.8 kg   General exam: Awake, laying in bed, in nad Respiratory system: Normal respiratory effort, no wheezing Cardiovascular system: regular rate, s1, s2 Gastrointestinal system: Soft, nondistended, positive BS Central nervous system: CN2-12 grossly intact, strength intact Extremities: Perfused, no clubbing Skin: Normal skin turgor, no notable skin lesions seen Psychiatry: Mood normal // no visual hallucinations   Condition at discharge: fair  The results of significant diagnostics from this hospitalization (including  imaging, microbiology, ancillary and laboratory) are listed below for reference.   Imaging Studies: DG Chest Portable 1 View Result Date: 02/27/2024 CLINICAL DATA:  Fever. EXAM: PORTABLE CHEST 1 VIEW COMPARISON:  Jan 18, 2021. FINDINGS: Stable cardiomediastinal silhouette. Minimal bibasilar subsegmental atelectasis is noted. Bony thorax is unremarkable. IMPRESSION: Minimal bibasilar subsegmental atelectasis. Electronically Signed   By: Lynwood Landy Raddle M.D.   On: 02/27/2024 18:34   CT ABDOMEN PELVIS W CONTRAST Result Date: 02/27/2024 CLINICAL DATA:  Abdominal pain, acute, nonlocalized low back, scrotal pain, fever, recent procedures. EXAM: CT ABDOMEN AND PELVIS WITH CONTRAST TECHNIQUE: Multidetector CT imaging of the abdomen and pelvis was performed using the standard protocol following bolus administration of intravenous contrast. RADIATION DOSE REDUCTION: This exam was performed according to the departmental dose-optimization program which includes automated exposure control, adjustment of the mA and/or kV according to patient size and/or use of iterative reconstruction technique. CONTRAST:  OMNIPAQUE  IOHEXOL  300 MG/ML  SOLN COMPARISON:  CT scan abdomen from 06/27/2023. FINDINGS: Lower chest: There are  patchy atelectatic changes in the visualized lung bases. No overt consolidation. No pleural effusion. The heart is normal in size. No pericardial effusion. Hepatobiliary: The liver is normal in size. Non-cirrhotic configuration. No suspicious mass. These is mild diffuse hepatic steatosis. No intrahepatic or extrahepatic bile duct dilation. No calcified gallstones. Normal gallbladder wall thickness. No pericholecystic inflammatory changes. Pancreas: Unremarkable. No pancreatic ductal dilatation or surrounding inflammatory changes. Spleen: Within normal limits. No focal lesion. Adrenals/Urinary Tract: There is a 1.8 x 2.4 cm the stable left adrenal adenoma. Unremarkable right adrenal gland. No suspicious  renal mass. There is a partially exophytic 3.4 x 3.6 cm cyst arising from the right kidney upper pole, posteromedially. There additional subcentimeter sized hypoattenuating foci in bilateral kidneys, which are too small to adequately characterize. No hydroureteronephrosis or nephroureterolithiasis on either side. Urinary bladder is under distended, precluding optimal assessment. However, no large mass or stones identified. No perivesical fat stranding. Stomach/Bowel: There is a small diverticulum arising from the second part of duodenum. No disproportionate dilation of the small or large bowel loops. No evidence of abnormal bowel wall thickening or inflammatory changes. The appendix is unremarkable. Vascular/Lymphatic: No ascites or pneumoperitoneum. No abdominal or pelvic lymphadenopathy, by size criteria. No aneurysmal dilation of the major abdominal arteries. There are mild peripheral atherosclerotic vascular calcifications of the aorta and its major branches. Reproductive: Normal size prostate. Symmetric seminal vesicles. Other: There are small fat containing umbilical and right inguinal hernias. The soft tissues and abdominal wall are otherwise unremarkable. Musculoskeletal: No suspicious osseous lesions. There are mild multilevel degenerative changes in the visualized spine. IMPRESSION: 1. No acute inflammatory process identified within the abdomen or pelvis. 2. No nephroureterolithiasis or obstructive uropathy. 3. Multiple other nonacute observations, as described above. Aortic Atherosclerosis (ICD10-I70.0). Electronically Signed   By: Ree Molt M.D.   On: 02/27/2024 16:06    Microbiology: Results for orders placed or performed during the hospital encounter of 02/27/24  Blood culture (routine x 2)     Status: None (Preliminary result)   Collection Time: 02/27/24  1:41 PM   Specimen: BLOOD  Result Value Ref Range Status   Specimen Description   Final    BLOOD RIGHT ANTECUBITAL Performed at  [facility] [address] [place], [place] [address]    Special Requests   Final    BOTTLES DRAWN AEROBIC AND ANAEROBIC Blood Culture results may not be optimal due to an inadequate volume of blood received in culture bottles Performed at [facility], [address], [place], [place] [address]    Culture   Final    NO GROWTH < 24 HOURS Performed at [facility], [address], [place], [place] [address]    Report Status PENDING  Incomplete  Resp panel by RT-PCR (RSV, Flu A&B, Covid) Anterior Nasal Swab     Status: None   Collection Time: 02/27/24  3:42 PM   Specimen: Anterior Nasal Swab  Result Value Ref Range Status   SARS Coronavirus 2 by RT PCR NEGATIVE NEGATIVE Final    Comment: (NOTE) SARS-CoV-2 target nucleic acids are NOT DETECTED.  The SARS-CoV-2 RNA is generally detectable in upper respiratory specimens during the acute phase of infection. The lowest concentration of SARS-CoV-2 viral copies this assay can detect is 138 copies/mL. A negative result does not preclude SARS-Cov-2 infection and should not be used as the sole basis for treatment or other patient management decisions. A negative result may occur with  improper specimen collection/handling,  submission of specimen other than nasopharyngeal swab, presence of viral mutation(s) within the areas targeted by this assay, and inadequate number of viral copies(<138 copies/mL). A negative result must be combined with clinical observations, patient history, and epidemiological information. The expected result is Negative.  Fact Sheet for Patients:  BloggerCourse.com  Fact Sheet for Healthcare Providers:  SeriousBroker.it  This test is no t yet approved or cleared by the [place]  FDA and  has been authorized for detection and/or diagnosis of SARS-CoV-2 by FDA under an Emergency Use Authorization (EUA). This EUA will  remain  in effect (meaning this test can be used) for the duration of the COVID-19 declaration under Section 564(b)(1) of the Act, 21 U.S.C.section 360bbb-3(b)(1), unless the authorization is terminated  or revoked sooner.       Influenza A by PCR NEGATIVE NEGATIVE Final   Influenza B by PCR NEGATIVE NEGATIVE Final    Comment: (NOTE) The Xpert Xpress SARS-CoV-2/FLU/RSV plus assay is intended as an aid in the diagnosis of influenza from Nasopharyngeal swab specimens and should not be used as a sole basis for treatment. Nasal washings and aspirates are unacceptable for Xpert Xpress SARS-CoV-2/FLU/RSV testing.  Fact Sheet for Patients: BloggerCourse.com  Fact Sheet for Healthcare Providers: SeriousBroker.it  This test is not yet approved or cleared by the [place]  FDA and has been authorized for detection and/or diagnosis of SARS-CoV-2 by FDA under an Emergency Use Authorization (EUA). This EUA will remain in effect (meaning this test can be used) for the duration of the COVID-19 declaration under Section 564(b)(1) of the Act, 21 U.S.C. section 360bbb-3(b)(1), unless the authorization is terminated or revoked.     Resp Syncytial Virus by PCR NEGATIVE NEGATIVE Final    Comment: (NOTE) Fact Sheet for Patients: BloggerCourse.com  Fact Sheet for Healthcare Providers: SeriousBroker.it  This test is not yet approved or cleared by the [place]  FDA and has been authorized for detection and/or diagnosis of SARS-CoV-2 by FDA under an Emergency Use Authorization (EUA). This EUA will remain in effect (meaning this test can be used) for the duration of the COVID-19 declaration under Section 564(b)(1) of the Act, 21 U.S.C. section 360bbb-3(b)(1), unless the authorization is terminated or revoked.  Performed at [facility], [address], [place], [place]  [address]   Blood culture (routine x 2)     Status: None (Preliminary result)   Collection Time: 02/27/24 10:20 PM   Specimen: BLOOD  Result Value Ref Range Status   Specimen Description   Final    BLOOD LEFT ANTECUBITAL Performed at [facility], [address], [place], [place] [address]    Special Requests   Final    BOTTLES DRAWN AEROBIC AND ANAEROBIC Blood Culture adequate volume Performed at [facility], [address], [place], [place] [address]    Culture   Final    NO GROWTH < 12 HOURS Performed at [facility], [address] [place], [place] [address]    Report Status PENDING  Incomplete    Labs: CBC: Recent Labs  Lab 02/27/24 1240 02/28/24 0511 02/29/24 0440  WBC 6.1 5.5 5.4  NEUTROABS 5.0  --   --   HGB 15.5 12.9* 13.1  HCT 47.7 39.3 39.6  MCV 85.2 84.9 85.2  PLT 193 164 157   Basic Metabolic Panel: Recent Labs  Lab 02/27/24 1240 02/28/24 0511 02/29/24 0440  NA 137 131* 138  K 3.4* 3.5 3.7  CL 107 104 110  CO2 21* 18* 20*  GLUCOSE 124* 114* 118*  BUN 14 14 15   CREATININE 1.12 0.96 0.81  CALCIUM 9.3 8.5* 8.8*   Liver Function Tests: Recent Labs  Lab 02/27/24 1240 02/29/24 0440  AST 22 37  ALT 31 41  ALKPHOS 76 66  BILITOT 1.0 0.5  PROT 7.4 6.4*  ALBUMIN 4.3 3.5   CBG: No results for input(s): GLUCAP in the last 168 hours.  Discharge time spent: less than 30 minutes.  Signed: Garnette Pelt, MD [facility] 02/29/2024

## 2024-03-03 LAB — CULTURE, BLOOD (ROUTINE X 2): Culture: NO GROWTH

## 2024-03-04 LAB — CULTURE, BLOOD (ROUTINE X 2)
Culture: NO GROWTH
Special Requests: ADEQUATE

## 2024-03-06 DIAGNOSIS — R3912 Poor urinary stream: Secondary | ICD-10-CM | POA: Diagnosis not present

## 2024-03-06 DIAGNOSIS — R350 Frequency of micturition: Secondary | ICD-10-CM | POA: Diagnosis not present

## 2024-03-06 DIAGNOSIS — R3911 Hesitancy of micturition: Secondary | ICD-10-CM | POA: Diagnosis not present

## 2024-03-06 DIAGNOSIS — N35013 Post-traumatic anterior urethral stricture: Secondary | ICD-10-CM | POA: Diagnosis not present

## 2024-03-07 DIAGNOSIS — N390 Urinary tract infection, site not specified: Secondary | ICD-10-CM | POA: Diagnosis not present

## 2024-03-07 DIAGNOSIS — R39198 Other difficulties with micturition: Secondary | ICD-10-CM | POA: Diagnosis not present

## 2024-03-07 DIAGNOSIS — R6510 Systemic inflammatory response syndrome (SIRS) of non-infectious origin without acute organ dysfunction: Secondary | ICD-10-CM | POA: Diagnosis not present

## 2024-03-07 DIAGNOSIS — G610 Guillain-Barre syndrome: Secondary | ICD-10-CM | POA: Diagnosis not present

## 2024-03-19 DIAGNOSIS — L03116 Cellulitis of left lower limb: Secondary | ICD-10-CM | POA: Diagnosis not present

## 2024-03-28 DIAGNOSIS — R1010 Upper abdominal pain, unspecified: Secondary | ICD-10-CM | POA: Diagnosis not present

## 2024-03-28 DIAGNOSIS — L309 Dermatitis, unspecified: Secondary | ICD-10-CM | POA: Diagnosis not present

## 2024-04-16 DIAGNOSIS — N35013 Post-traumatic anterior urethral stricture: Secondary | ICD-10-CM | POA: Diagnosis not present

## 2024-04-16 DIAGNOSIS — R300 Dysuria: Secondary | ICD-10-CM | POA: Diagnosis not present

## 2024-04-16 DIAGNOSIS — R3916 Straining to void: Secondary | ICD-10-CM | POA: Diagnosis not present

## 2024-04-16 DIAGNOSIS — R3914 Feeling of incomplete bladder emptying: Secondary | ICD-10-CM | POA: Diagnosis not present

## 2024-05-08 DIAGNOSIS — R3914 Feeling of incomplete bladder emptying: Secondary | ICD-10-CM | POA: Diagnosis not present

## 2024-05-08 DIAGNOSIS — N35013 Post-traumatic anterior urethral stricture: Secondary | ICD-10-CM | POA: Diagnosis not present

## 2024-05-13 ENCOUNTER — Other Ambulatory Visit: Payer: Self-pay | Admitting: Urology

## 2024-05-19 ENCOUNTER — Encounter (HOSPITAL_COMMUNITY): Payer: Self-pay

## 2024-05-19 NOTE — Patient Instructions (Signed)
 SURGICAL WAITING ROOM VISITATION  Patients having surgery or a procedure may have no more than 2 support people in the waiting area - these visitors may rotate.    Children under the age of 38 must have an adult with them who is not the patient.  Visitors with respiratory illnesses are discouraged from visiting and should remain at home.  If the patient needs to stay at the hospital during part of their recovery, the visitor guidelines for inpatient rooms apply. Pre-op nurse will coordinate an appropriate time for 1 support person to accompany patient in pre-op.  This support person may not rotate.    Please refer to the [facility] website for the visitor guidelines for Inpatients (after your surgery is over and you are in a regular room).       Your procedure is scheduled on: 05-22-24   Report to [facility] Main Entrance    Report to admitting at     0830  AM   Call this number if you have problems the morning of surgery [phone]   Do not eat food    or drink liquids :After Midnight.  Except sips of water with meds              If you have questions, please contact your surgeon's office.   FOLLOW ANY ADDITIONAL PRE OP INSTRUCTIONS YOU RECEIVED FROM YOUR SURGEON'S OFFICE!!!     Oral Hygiene is also important to reduce your risk of infection.                                    Remember - BRUSH YOUR TEETH THE MORNING OF SURGERY WITH YOUR REGULAR TOOTHPASTE  DENTURES WILL BE REMOVED PRIOR TO SURGERY PLEASE DO NOT APPLY Poly grip OR ADHESIVES!!!   Do NOT smoke after Midnight   Stop all vitamins and herbal supplements 7 days before surgery.   Take these medicines the morning of surgery with A SIP OF WATER: Tamsulosin , Finasteride , amlodipine , cephalexin    Bring CPAP mask and tubing day of surgery.                              You may not have any metal on your body including hair pins, jewelry, and body piercing             Do not wear  lotions, powders,  perfumes/cologne, or deodorant                Men may shave face and neck.   Do not bring valuables to the hospital. [facility] IS NOT             RESPONSIBLE   FOR VALUABLES.   Contacts, glasses, dentures or bridgework may not be worn into surgery.   Bring small overnight bag day of surgery.   DO NOT BRING YOUR HOME MEDICATIONS TO THE HOSPITAL. PHARMACY WILL DISPENSE MEDICATIONS LISTED ON YOUR MEDICATION LIST TO YOU DURING YOUR ADMISSION IN THE HOSPITAL!    Patients discharged on the day of surgery will not be allowed to drive home.  Someone NEEDS to stay with you for the first 24 hours after anesthesia.   Special Instructions: Bring a copy of your healthcare power of attorney and living will documents the day of surgery if you haven't scanned them before.  Please read over the following fact sheets you were given: IF YOU HAVE QUESTIONS ABOUT YOUR PRE-OP INSTRUCTIONS PLEASE CALL [phone].    If you test positive for Covid or have been in contact with anyone that has tested positive in the last 10 days please notify you surgeon.    [place] - Preparing for Surgery Before surgery, you can play an important role.  Because skin is not sterile, your skin needs to be as free of germs as possible.  You can reduce the number of germs on your skin by washing with CHG (chlorahexidine gluconate) soap before surgery.  CHG is an antiseptic cleaner which kills germs and bonds with the skin to continue killing germs even after washing. Please DO NOT use if you have an allergy  to CHG or antibacterial soaps.  If your skin becomes reddened/irritated stop using the CHG and inform your nurse when you arrive at Short Stay. Do not shave (including legs and underarms) for at least 48 hours prior to the first CHG shower.  You may shave your face/neck. Please follow these instructions carefully:  1.  Shower with CHG Soap the night before surgery and the  morning of Surgery.  2.  If you choose  to wash your hair, wash your hair first as usual with your  normal  shampoo.  3.  After you shampoo, rinse your hair and body thoroughly to remove the  shampoo.                           4.  Use CHG as you would any other liquid soap.  You can apply chg directly  to the skin and wash                       Gently with a scrungie or clean washcloth.  5.  Apply the CHG Soap to your body ONLY FROM THE NECK DOWN.   Do not use on face/ open                           Wound or open sores. Avoid contact with eyes, ears mouth and genitals (private parts).                       Wash face,  Genitals (private parts) with your normal soap.             6.  Wash thoroughly, paying special attention to the area where your surgery  will be performed.  7.  Thoroughly rinse your body with warm water from the neck down.  8.  DO NOT shower/wash with your normal soap after using and rinsing off  the CHG Soap.                9.  Pat yourself dry with a clean towel.            10.  Wear clean pajamas.            11.  Place clean sheets on your bed the night of your first shower and do not  sleep with pets. Day of Surgery : Do not apply any lotions/deodorants the morning of surgery.  Please wear clean clothes to the hospital/surgery center.  FAILURE TO FOLLOW THESE INSTRUCTIONS MAY RESULT IN THE CANCELLATION OF YOUR SURGERY PATIENT SIGNATURE_________________________________  NURSE SIGNATURE__________________________________  ________________________________________________________________________

## 2024-05-19 NOTE — Progress Notes (Addendum)
 PCP - Beverley Morrow,MD  LOV 03-28-24 CE Cardiologist - n/a  PPM/ICD -  Device Orders -  Rep Notified -   Chest x-ray - 02-27-24 epic EKG - 02-27-24 epic   LBBB   repeated at preop Stress Test -  ECHO -  Cardiac Cath -   Sleep Study - yes CPAP - yes  Fasting Blood Sugar -  Checks Blood Sugar ___n/a__ times a day  Blood Thinner Instructions:n/a Aspirin Instructions:n/a  ERAS Protcol -n/a PRE-SURGERY n/a   COVID vaccine -yes  Activity--Able to climb a flight of stairs with no CP or SOB  Anesthesia review: EKG 02-27-24 LBBB,HTN,OSA, Alpha gal, Guillain Barre, unable to get labs at preop. Pt. Extremely hard stick and required US  to get IV in the past. Labs put in for DOS.  Patient denies shortness of breath, fever, cough and chest pain at PAT appointment   All instructions explained to the patient, with a verbal understanding of the material. Patient agrees to go over the instructions while at home for a better understanding. Patient also instructed to self quarantine after being tested for COVID-19. The opportunity to ask questions was provided.

## 2024-05-21 ENCOUNTER — Encounter (HOSPITAL_COMMUNITY): Payer: Self-pay

## 2024-05-21 ENCOUNTER — Other Ambulatory Visit: Payer: Self-pay

## 2024-05-21 ENCOUNTER — Encounter (HOSPITAL_COMMUNITY)
Admission: RE | Admit: 2024-05-21 | Discharge: 2024-05-21 | Disposition: A | Discharge status: Home / Self Care | Source: Ambulatory Visit | Attending: Urology | Admitting: Urology

## 2024-05-21 VITALS — BP 139/91 | HR 68 | Temp 98.0°F | Resp 16 | Ht 68.0 in | Wt 220.6 lb

## 2024-05-21 DIAGNOSIS — I447 Left bundle-branch block, unspecified: Secondary | ICD-10-CM | POA: Insufficient documentation

## 2024-05-21 DIAGNOSIS — N189 Chronic kidney disease, unspecified: Secondary | ICD-10-CM | POA: Diagnosis not present

## 2024-05-21 DIAGNOSIS — G4730 Sleep apnea, unspecified: Secondary | ICD-10-CM | POA: Diagnosis not present

## 2024-05-21 DIAGNOSIS — Z01810 Encounter for preprocedural cardiovascular examination: Secondary | ICD-10-CM | POA: Insufficient documentation

## 2024-05-21 DIAGNOSIS — I10 Essential (primary) hypertension: Secondary | ICD-10-CM

## 2024-05-21 DIAGNOSIS — I129 Hypertensive chronic kidney disease with stage 1 through stage 4 chronic kidney disease, or unspecified chronic kidney disease: Secondary | ICD-10-CM | POA: Diagnosis not present

## 2024-05-21 DIAGNOSIS — K219 Gastro-esophageal reflux disease without esophagitis: Secondary | ICD-10-CM | POA: Diagnosis not present

## 2024-05-21 DIAGNOSIS — Z87891 Personal history of nicotine dependence: Secondary | ICD-10-CM | POA: Diagnosis not present

## 2024-05-21 DIAGNOSIS — N35013 Post-traumatic anterior urethral stricture: Secondary | ICD-10-CM | POA: Diagnosis not present

## 2024-05-21 NOTE — H&P (Signed)
 63 year old male with history of spraying urinary stream and urethral pain. He is status post urethral dilation in the OR for a pendulous urethral stricture. Per operative report patient had 3 short dense urethral strictures in the pendulous urethra. The strictures are likely secondary to TURP that was performed in December 2024. He was last seen on 7/7 for worsening stream and straining to urinate.   Urethral stricture:  04/16/2024: burning with urination, pt is straining with urination. Cysot shows smalll Fossa navicularis stricture, and psndulaous stricture, PVR 140.  05/08/24: RUG today shows mid pendulous to proximal pendulous urethral stricture that is amenable to urethroplasty but discussed Optilume and buccal harvest urethroplasty today. Return precautions were given to the patient.  05/22/24: plan for optilume today     ALLERGIES: Beef/Pork - alphagal Seafood - Anaphylaxis, can't breathe    MEDICATIONS: Finasteride  5 MG Tablet 1 tablet PO Daily  Amlodipine  Besilate     GU PSH: Complex cystometrogram, with voiding pressure studies, any technique - 07/03/2023 Complex Uroflow - 07/03/2023 Cystoscopy - 04/16/2024 Emg surf Electrd - 07/03/2023     NON-GU PSH: Visit Complexity (formerly GPC1X) - 04/16/2024, 03/06/2024, 02/25/2024, 01/07/2024, 12/14/2023, 11/02/2023, 07/12/2023, 05/23/2023, 03/19/2023     GU PMH: Anterior urethral stricture (Stable) - 04/16/2024, - 03/06/2024 Dysuria (Stable) - 04/16/2024, - 09/11/2023 Incomplete bladder emptying (Stable) - 04/16/2024, - 01/09/2024, - 11/02/2023, - 09/11/2023, - 08/27/2023, - 08/23/2023, - 07/27/2023, - 07/12/2023 Straining on Urination (Stable) - 04/16/2024, - 12/14/2023 Urinary Frequency - 03/06/2024, (Stable), - 07/12/2023, - 07/03/2023, - 05/23/2023, - 03/19/2023 Urinary Hesitancy - 03/06/2024, - 07/12/2023 Weak Urinary Stream - 03/06/2024, (Stable), - 12/14/2023, - 11/02/2023 (Stable), - 07/12/2023, - 07/03/2023, - 05/23/2023, - 03/19/2023 Acute Cystitis/UTI -  02/25/2024 Pelvic/perineal pain - 01/07/2024, - 12/14/2023, - 08/27/2023, - 08/23/2023, - 08/21/2023 Splitting of urinary stream - 01/07/2024, - 12/14/2023 BPH w/LUTS - 12/14/2023, - 11/02/2023, - 09/11/2023, - 08/27/2023, - 08/23/2023, - 07/27/2023, - 07/12/2023, - 07/03/2023, - 05/23/2023, - 03/19/2023, - 2022 (Worsening), He does have BPH by exam. He has mild voiding symptoms., - 2019 Urinary Urgency - 09/11/2023, - 07/03/2023, - 05/23/2023, - 03/19/2023 Gross hematuria - 08/23/2023, - 08/21/2023 Ureteral calculus - 08/23/2023, - 07/27/2023, - 07/12/2023 Nocturia (Stable) - 07/12/2023, - 07/03/2023, - 05/23/2023 (Stable), - 03/19/2023, - 2022 (Improving), He has had improvement with both the Myrbetriq and the CPAP but doesn't want to continue on medication long-term if possible. I told him that that was fine with me and if he had further difficulties I would be happy to in follow-up. , - 2018, While his nocturia area has improved somewhat with the use of a CPAP mask he continues to be bothered by both daytime and nighttime voiding symptoms., - 2018 Elevated PSA - 05/23/2023, - 03/19/2023 (Improving), His prostate is benign and his PSA has remained at an acceptable level currently at 2.58. What I have recommended is continued annual DRE and PSA with an extra PSA being checked in 6 months from now to assure stability., - 2020, I noted no abnormality on his DRE. He understands the slight risk of continued monitoring of his PSA but has elected to have this repeated again in 3 months with abstinence prior to having his PSA drawn. He also understands that if his PSA continues to rise my recommendation is that we would then proceed with a prostate biopsy, - 2019 Overactive bladder, His symptoms are suggestive of bladder overactivity. He empties his bladder adequately and has clear urine so I am going to begin a  trial of Myrbetriq 25 mg. I'll then have him return to reassess how this has worked for him in recheck his PVR. - 2018    NON-GU  PMH: Bacteriuria - 09/11/2023 Tinea unguium - 2022 GERD Hypertension    FAMILY HISTORY: Cancer - Father, Mother   SOCIAL HISTORY: Marital Status: Married Preferred Language: English Current Smoking Status: Unknown if patient still smokes.   Tobacco Use Assessment Completed: Used Tobacco in last 30 days? Does not drink anymore.  Drinks 3 caffeinated drinks per day.    VITAL SIGNS: None   Complexity of Data:  Source Of History:  Patient  Urine Test Review:   Urinalysis  X-Ray Review: Retrograde Urethrogram: Reviewed Films. Discussed With Patient. Mid urethral stricture 2 to 3 cm in length possible false meatal stricture no stricture in the bulbar urethra.    05/23/23 03/19/23 11/01/20 06/18/19 12/06/18 09/19/18 06/06/18 01/02/17  PSA  Total PSA 4.52 ng/mL 5.15 ng/mL 3.83 ng/mL 2.57 ng/mL 2.58 ng/mL 2.54 ng/mL 3.74 ng/dl 7.04 ng/dl    PROCEDURES:          Retrograde [place] - 48389, 74450 Retrograde Ureth complete.  OMNIPAQUE  300 50ml (50ml Bottle used with 17 ml Injected and 33 ml wasted) Patient confirmed No Neulasta OnPro Device.            Visit Complexity - G2211          Urinalysis w/Scope Dipstick Dipstick Cont'd Micro  Color: Yellow Bilirubin: Neg mg/dL WBC/hpf: 6 - 89/yeq  Appearance: Clear Ketones: Neg mg/dL RBC/hpf: 0 - 2/hpf  Specific Gravity: 1.025 Blood: Neg ery/uL Bacteria: Many (>50/hpf)  pH: 6.0 Protein: 1+ mg/dL Cystals: NS (Not Seen)  Glucose: Neg mg/dL Urobilinogen: 0.2 mg/dL Casts: NS (Not Seen)    Nitrites: Neg Trichomonas: Not Present    Leukocyte Esterase: Trace leu/uL Mucous: Not Present      Epithelial Cells: NS (Not Seen)      Yeast: NS (Not Seen)      Sperm: Not Present    ASSESSMENT:      ICD-10 Details  1 GU:   Incomplete bladder emptying - R39.14 Chronic, Worsening  2   Anterior urethral stricture - N35.013 Chronic, Worsening     PLAN:           Orders Labs Urine Culture          Document Letter(s):  Created for Patient:  Clinical Summary         Notes:   Proximal pendulous urethral stricture: Based on retrograde urethrogram patient has a shorter mid urethral stricture offered Optilume dilation versus urethroplasty. Patient states he wants to think about it and we discussed risk benefits and alternatives of both.   For Optilume we discussed the short duration of data objectives efficacy. Also discussed the need to use a condom postoperatively also discussed the short urethral stricture this was applicable too.   For urethroplasty we discussed respites alternatives including pain with buccal harvest recurrent stricture, 85% success rate, fistula infection graft deficits recurrent stricture.   For his proximal navicularis stricture we discussed possible transurethral ventral inlay with the same risk as noted above.   We discussed return precautions including unable to inability to urinate fevers chills nausea vomiting state that if they stated. He need to come the office or go to the ER for stat dilation.   Patient chose proceed with optilume dilation. His urine culture was positive and he was sent keflex prior to the procedure.   Plan to proceed with  optilume dialtion

## 2024-05-22 ENCOUNTER — Ambulatory Visit (HOSPITAL_COMMUNITY)

## 2024-05-22 ENCOUNTER — Encounter (HOSPITAL_COMMUNITY): Payer: Self-pay | Admitting: Medical

## 2024-05-22 ENCOUNTER — Ambulatory Visit (HOSPITAL_COMMUNITY): Admitting: Certified Registered Nurse Anesthetist

## 2024-05-22 ENCOUNTER — Encounter (HOSPITAL_COMMUNITY): Payer: Self-pay | Admitting: Urology

## 2024-05-22 ENCOUNTER — Ambulatory Visit (HOSPITAL_COMMUNITY)
Admission: RE | Admit: 2024-05-22 | Discharge: 2024-05-22 | Disposition: A | Discharge status: Home / Self Care | Attending: Urology | Admitting: Urology

## 2024-05-22 ENCOUNTER — Encounter (HOSPITAL_COMMUNITY)
Admission: RE | Disposition: A | Discharge status: Home / Self Care | Payer: Self-pay | Source: Home / Self Care | Attending: Urology

## 2024-05-22 DIAGNOSIS — N35912 Unspecified bulbous urethral stricture, male: Secondary | ICD-10-CM | POA: Diagnosis not present

## 2024-05-22 DIAGNOSIS — G4730 Sleep apnea, unspecified: Secondary | ICD-10-CM | POA: Insufficient documentation

## 2024-05-22 DIAGNOSIS — I10 Essential (primary) hypertension: Secondary | ICD-10-CM

## 2024-05-22 DIAGNOSIS — N35013 Post-traumatic anterior urethral stricture: Secondary | ICD-10-CM | POA: Insufficient documentation

## 2024-05-22 DIAGNOSIS — K219 Gastro-esophageal reflux disease without esophagitis: Secondary | ICD-10-CM | POA: Insufficient documentation

## 2024-05-22 DIAGNOSIS — N35919 Unspecified urethral stricture, male, unspecified site: Secondary | ICD-10-CM | POA: Diagnosis not present

## 2024-05-22 DIAGNOSIS — Z87891 Personal history of nicotine dependence: Secondary | ICD-10-CM | POA: Insufficient documentation

## 2024-05-22 DIAGNOSIS — I129 Hypertensive chronic kidney disease with stage 1 through stage 4 chronic kidney disease, or unspecified chronic kidney disease: Secondary | ICD-10-CM | POA: Insufficient documentation

## 2024-05-22 DIAGNOSIS — N189 Chronic kidney disease, unspecified: Secondary | ICD-10-CM | POA: Insufficient documentation

## 2024-05-22 HISTORY — PX: CYSTOURETHROSCOPY, W/ URETHRAL STRICTURE DILATION USING DRUG-COATED BALLOON: SHX7696

## 2024-05-22 HISTORY — PX: CYSTOSCOPY W/ RETROGRADES: SHX1426

## 2024-05-22 LAB — CBC
HCT: 43.4 % (ref 39.0–52.0)
Hemoglobin: 14.1 g/dL (ref 13.0–17.0)
MCH: 26.7 pg (ref 26.0–34.0)
MCHC: 32.5 g/dL (ref 30.0–36.0)
MCV: 82.2 fL (ref 80.0–100.0)
Platelets: 274 K/uL (ref 150–400)
RBC: 5.28 MIL/uL (ref 4.22–5.81)
RDW: 13.2 % (ref 11.5–15.5)
WBC: 5.9 K/uL (ref 4.0–10.5)
nRBC: 0 % (ref 0.0–0.2)

## 2024-05-22 LAB — BASIC METABOLIC PANEL WITH GFR
Anion gap: 13 (ref 5–15)
BUN: 17 mg/dL (ref 8–23)
CO2: 22 mmol/L (ref 22–32)
Calcium: 9.9 mg/dL (ref 8.9–10.3)
Chloride: 106 mmol/L (ref 98–111)
Creatinine, Ser: 0.66 mg/dL (ref 0.61–1.24)
GFR, Estimated: >60 mL/min (ref >60)
Glucose, Bld: 114 mg/dL — ABNORMAL HIGH (ref 70–99)
Potassium: 3.6 mmol/L (ref 3.5–5.1)
Sodium: 140 mmol/L (ref 135–145)

## 2024-05-22 SURGERY — CYSTOURETHROSCOPY, WITH URETHRAL STRICTURE DILATION USING DRUG-COATED BALLOON
Anesthesia: General | Site: Urethra

## 2024-05-22 MED ORDER — HYOSCYAMINE SULFATE 0.125 MG PO TBDP: 0.1250 mg | ORAL_TABLET | Freq: Four times a day (QID) | ORAL | 0 refills | Status: AC | PRN

## 2024-05-22 MED ORDER — FENTANYL CITRATE PF 50 MCG/ML IJ SOSY
25.0000 ug | PREFILLED_SYRINGE | INTRAMUSCULAR | Status: DC | PRN
  Administered 2024-05-22: 50 ug via INTRAVENOUS

## 2024-05-22 MED ORDER — METHOCARBAMOL 750 MG PO TABS: 750.0000 mg | ORAL_TABLET | Freq: Four times a day (QID) | ORAL | 0 refills | Status: AC

## 2024-05-22 MED ORDER — EPHEDRINE SULFATE-NACL 50-0.9 MG/10ML-% IV SOSY
PREFILLED_SYRINGE | INTRAVENOUS | Status: DC | PRN
  Administered 2024-05-22: 10 mg via INTRAVENOUS

## 2024-05-22 MED ORDER — ORAL CARE MOUTH RINSE: 15.0000 mL | Freq: Once | OROMUCOSAL | Status: AC

## 2024-05-22 MED ORDER — ONDANSETRON HCL 4 MG/2ML IJ SOLN
INTRAMUSCULAR | Status: DC | PRN
Start: 2024-05-22 — End: 2024-05-22
  Administered 2024-05-22: 4 mg via INTRAVENOUS

## 2024-05-22 MED ORDER — FENTANYL CITRATE PF 50 MCG/ML IJ SOSY
PREFILLED_SYRINGE | INTRAMUSCULAR | Status: AC
  Filled 2024-05-22: qty 1

## 2024-05-22 MED ORDER — DEXAMETHASONE SODIUM PHOSPHATE 10 MG/ML IJ SOLN
INTRAMUSCULAR | Status: DC | PRN
  Administered 2024-05-22: 5 mg via INTRAVENOUS

## 2024-05-22 MED ORDER — EPHEDRINE 5 MG/ML INJ
INTRAVENOUS | Status: AC
  Filled 2024-05-22: qty 5

## 2024-05-22 MED ORDER — PHENYLEPHRINE 80 MCG/ML (10ML) SYRINGE FOR IV PUSH (FOR BLOOD PRESSURE SUPPORT)
PREFILLED_SYRINGE | INTRAVENOUS | Status: DC | PRN
  Administered 2024-05-22: 120 ug via INTRAVENOUS

## 2024-05-22 MED ORDER — IOHEXOL 300 MG/ML  SOLN
INTRAMUSCULAR | Status: DC | PRN
  Administered 2024-05-22: 50 mL

## 2024-05-22 MED ORDER — ACETAMINOPHEN 10 MG/ML IV SOLN: 1000.0000 mg | Freq: Once | INTRAVENOUS | Status: DC | PRN

## 2024-05-22 MED ORDER — FENTANYL CITRATE (PF) 100 MCG/2ML IJ SOLN
INTRAMUSCULAR | Status: DC | PRN
  Administered 2024-05-22: 50 ug via INTRAVENOUS
  Administered 2024-05-22 (×2): 25 ug via INTRAVENOUS

## 2024-05-22 MED ORDER — PROPOFOL 10 MG/ML IV BOLUS
INTRAVENOUS | Status: DC | PRN
  Administered 2024-05-22: 200 mg via INTRAVENOUS
  Administered 2024-05-22: 60 mg via INTRAVENOUS

## 2024-05-22 MED ORDER — LIDOCAINE 2% (20 MG/ML) 5 ML SYRINGE
INTRAMUSCULAR | Status: DC | PRN
  Administered 2024-05-22: 100 mg via INTRAVENOUS

## 2024-05-22 MED ORDER — DROPERIDOL 2.5 MG/ML IJ SOLN: 0.6250 mg | Freq: Once | INTRAMUSCULAR | Status: DC | PRN

## 2024-05-22 MED ORDER — SODIUM CHLORIDE 0.9 % IR SOLN
Status: DC | PRN
  Administered 2024-05-22: 3000 mL

## 2024-05-22 MED ORDER — SODIUM CHLORIDE 0.9 % IV SOLN
1.0000 g | INTRAVENOUS | Status: AC
  Administered 2024-05-22: 1 g via INTRAVENOUS
  Filled 2024-05-22: qty 1000

## 2024-05-22 MED ORDER — 0.9 % SODIUM CHLORIDE (POUR BTL) OPTIME
TOPICAL | Status: DC | PRN
  Administered 2024-05-22: 1000 mL

## 2024-05-22 MED ORDER — FENTANYL CITRATE (PF) 100 MCG/2ML IJ SOLN
INTRAMUSCULAR | Status: AC
  Filled 2024-05-22: qty 2

## 2024-05-22 MED ORDER — GENTAMICIN SULFATE 40 MG/ML IJ SOLN
240.0000 mg | INTRAVENOUS | Status: AC
Start: 2024-05-22 — End: 2024-05-22
  Administered 2024-05-22: 240 mg via INTRAVENOUS
  Filled 2024-05-22: qty 6

## 2024-05-22 MED ORDER — MIDAZOLAM HCL 2 MG/2ML IJ SOLN
INTRAMUSCULAR | Status: AC
  Filled 2024-05-22: qty 2

## 2024-05-22 MED ORDER — MIDAZOLAM HCL 2 MG/2ML IJ SOLN
INTRAMUSCULAR | Status: DC | PRN
  Administered 2024-05-22: 2 mg via INTRAVENOUS

## 2024-05-22 MED ORDER — PHENAZOPYRIDINE HCL 200 MG PO TABS: 200.0000 mg | ORAL_TABLET | Freq: Three times a day (TID) | ORAL | 0 refills | Status: AC | PRN

## 2024-05-22 MED ORDER — LACTATED RINGERS IV SOLN: INTRAVENOUS | Status: DC

## 2024-05-22 MED ORDER — CHLORHEXIDINE GLUCONATE 0.12 % MT SOLN
15.0000 mL | Freq: Once | OROMUCOSAL | Status: AC
  Administered 2024-05-22: 15 mL via OROMUCOSAL

## 2024-05-22 MED ORDER — PHENYLEPHRINE 80 MCG/ML (10ML) SYRINGE FOR IV PUSH (FOR BLOOD PRESSURE SUPPORT)
PREFILLED_SYRINGE | INTRAVENOUS | Status: AC
  Filled 2024-05-22: qty 10

## 2024-05-22 SURGICAL SUPPLY — 22 items
BAG COUNTER SPONGE SURGICOUNT (BAG) IMPLANT
BAG URINE DRAIN 2000ML AR STRL (UROLOGICAL SUPPLIES) ×2 IMPLANT
BAG URO CATCHER STRL LF (MISCELLANEOUS) ×2 IMPLANT
BALLOON NEPHROSTOMY (BALLOONS) IMPLANT
BALLOON OPTILUME DCB 30X5X75 (BALLOONS) ×2 IMPLANT
CATH FOLEY 2W COUNCIL 20FR 5CC (CATHETERS) IMPLANT
CATH ROBINSON RED A/P 14FR (CATHETERS) IMPLANT
CATH URETL OPEN 5X70 (CATHETERS) IMPLANT
CLOTH BEACON ORANGE TIMEOUT ST (SAFETY) ×2 IMPLANT
GLOVE BIO SURGEON STRL SZ7.5 (GLOVE) ×2 IMPLANT
GLOVE BIO SURGEON STRL SZ8 (GLOVE) ×2 IMPLANT
GOWN STRL REUS W/ TWL XL LVL3 (GOWN DISPOSABLE) ×2 IMPLANT
GOWN STRL SURGICAL XL XLNG (GOWN DISPOSABLE) ×2 IMPLANT
GUIDEWIRE ANG ZIPWIRE 038X150 (WIRE) IMPLANT
GUIDEWIRE STR DUAL SENSOR (WIRE) ×2 IMPLANT
KIT TURNOVER KIT A (KITS) ×2 IMPLANT
MANIFOLD NEPTUNE II (INSTRUMENTS) ×2 IMPLANT
NS IRRIG 1000ML POUR BTL (IV SOLUTION) IMPLANT
PACK CYSTO (CUSTOM PROCEDURE TRAY) ×2 IMPLANT
TRACTIP FLEXIVA PULS ID 200XHI (Laser) IMPLANT
TUBING CONNECTING 10 (TUBING) ×2 IMPLANT
WATER STERILE IRR 3000ML UROMA (IV SOLUTION) ×2 IMPLANT

## 2024-05-22 NOTE — Anesthesia Procedure Notes (Signed)
 Procedure Name: LMA Insertion Date/Time: 05/22/2024 11:00 AM  Performed by: [place], Corean BROCKS, CRNAPre-anesthesia Checklist: Patient identified, Emergency Drugs available, Suction available and Patient being monitored Patient Re-evaluated:Patient Re-evaluated prior to induction Oxygen Delivery Method: Circle system utilized Preoxygenation: Pre-oxygenation with 100% oxygen Induction Type: IV induction Ventilation: Mask ventilation without difficulty LMA: LMA inserted LMA Size: 4.0 Number of attempts: 1 Airway Equipment and Method: Bite block Placement Confirmation: positive ETCO2 Tube secured with: Tape Dental Injury: Teeth and Oropharynx as per pre-operative assessment

## 2024-05-22 NOTE — Transfer of Care (Addendum)
 Immediate Anesthesia Transfer of Care Note  Patient: Steven Steele  Procedure(s) Performed: CYSTOURETHROSCOPY, WITH URETHRAL STRICTURE DILATION USING DRUG-COATED BALLOON CYSTOSCOPY, WITH RETROGRADE PYELOGRAM  Patient Location: PACU  Anesthesia Type:General  Level of Consciousness: awake, alert , and oriented  Airway & Oxygen Therapy: Patient Spontanous Breathing and Patient connected to face mask oxygen  Post-op Assessment: Report given to RN and Post -op Vital signs reviewed and stable  Post vital signs: Reviewed and stable  Last Vitals:  Vitals Value Taken Time  BP 131/74 05/22/24 11:50  Temp    Pulse 95 05/22/24 11:50  Resp 15 05/22/24 11:50  SpO2 99% 05/22/24 11:40    Last Pain:  Vitals:   05/22/24 0845  TempSrc:   PainSc: 0-No pain         Complications: No notable events documented.

## 2024-05-22 NOTE — Anesthesia Preprocedure Evaluation (Signed)
 Anesthesia Evaluation  Patient identified by MRN, date of birth, ID band Patient awake    Reviewed: Allergy  & Precautions, NPO status , Patient's Chart, lab work & pertinent test results  Airway Mallampati: II  TM Distance: >3 FB Neck ROM: Full    Dental no notable dental hx.    Pulmonary sleep apnea , Current Smoker and Patient abstained from smoking.   Pulmonary exam normal        Cardiovascular hypertension, Pt. on medications  Rhythm:Regular Rate:Normal     Neuro/Psych negative neurological ROS  negative psych ROS   GI/Hepatic Neg liver ROS,GERD  ,,  Endo/Other  negative endocrine ROS    Renal/GU Renal diseaseUrethral stricture  negative genitourinary   Musculoskeletal  (+) Arthritis , Osteoarthritis,    Abdominal Normal abdominal exam  (+)   Peds  Hematology negative hematology ROS (+)   Anesthesia Other Findings   Reproductive/Obstetrics                              Anesthesia Physical Anesthesia Plan  ASA: 2  Anesthesia Plan: General   Post-op Pain Management:    Induction: Intravenous  PONV Risk Score and Plan: 1 and Dexamethasone , Ondansetron , Midazolam  and Treatment may vary due to age or medical condition  Airway Management Planned: Mask and LMA  Additional Equipment: None  Intra-op Plan:   Post-operative Plan: Extubation in OR  Informed Consent: I have reviewed the patients History and Physical, chart, labs and discussed the procedure including the risks, benefits and alternatives for the proposed anesthesia with the patient or authorized representative who has indicated his/her understanding and acceptance.     Dental advisory given  Plan Discussed with: CRNA  Anesthesia Plan Comments:         Anesthesia Quick Evaluation

## 2024-05-22 NOTE — Discharge Instructions (Signed)
 Optilume Dilation   Definition:  Optilume dilation as a procedure using a chemotherapy coated balloon to prevent scar tissue from forming a recurrent scar as well as dilation of the urethral stricture.  General instructions:     Your recent urethral dilation surgeries requires very little post hospital care but some definite precautions.  Despite the fact that no skin incisions were used, the areas inside the urethra that are are raw and covered with scabs to promote healing and prevent bleeding. Certain precautions are needed to insure that the scabs are not disturbed over the next 2-4 weeks while the healing proceeds.  Because the raw surface inside your bladder and the irritating effects of urine you may expect frequency of urination and/or urgency (a stronger desire to urinate) and perhaps even getting up at night more often. This will usually resolve or improve slowly over the healing period. You may see some blood in your urine over the first 6 weeks. Do not be alarmed, even if the urine was clear for a while. Get off your feet and drink lots of fluids until clearing occurs. If you start to pass clots or don't improve call us .  Catheter: Due to the nature of this surgery patients need to be discharged with a catheter. Catheter instructions on how to care for the catheter will be attached to your discharge paperwork. You will be called to schedule an appointment to remove the catheter.   Diet:  You may return to your normal diet immediately. Because of the raw surface of your urethra, alcohol, spicy foods, foods high in acid and drinks with caffeine may cause irritation or frequency and should be used in moderation. To keep your urine flowing freely and avoid constipation, drink plenty of fluids during the day (8-10 glasses). Tip: Avoid cranberry juice because it is very acidic.  Activity:  Do not lift more than 10 LBS for 2 weeks Do not strain with bowel movents.  Your physical  activity doesn't need to be restricted. However, if you are very active, you may see some blood in the urine. We suggest that you reduce your activity under the circumstances until the bleeding has stopped.  Bowels:  It is important to keep your bowels regular during the postoperative period. Straining with bowel movements can cause bleeding. A bowel movement every other day is reasonable. Use a mild laxative if needed, such as milk of magnesia 2-3 tablespoons, or 2 Dulcolax tablets. Call if you continue to have problems. If you had been taking narcotics for pain, before, during or after your surgery, you may be constipated. Take a laxative if necessary.    Medication:  You should resume your pre-surgery medications unless told not to. In addition you may be given an antibiotic to prevent or treat infection. Antibiotics are not always necessary. All medication should be taken as prescribed until the bottles are finished unless you are having an unusual reaction to one of the drugs.  Foley Catheter Care A soft, flexible tube (Foley catheter) may have been placed in your bladder to drain urine and fluid. Follow these instructions: Taking Care of the Catheter Keep the area where the catheter leaves your body clean.  Attach the catheter to the leg so there is no tension on the catheter.  Keep the drainage bag below the level of the bladder, but keep it OFF the floor.  Do not take long soaking baths. Your caregiver will give instructions about showering.  Wash your hands before touching ANYTHING  related to the catheter or bag.  Using mild soap and warm water on a washcloth:  Clean the area closest to the catheter insertion site using a circular motion around the catheter.  Clean the catheter itself by wiping AWAY from the insertion site for several inches down the tube.  NEVER wipe upward as this could sweep bacteria up into the urethra (tube in your body that normally drains the bladder) and cause  infection.  Place a small amount of sterile lubricant at the tip of the penis where the catheter is entering.  Taking Care of the Drainage Bags Two drainage bags may be taken home: a large overnight drainage bag, and a smaller leg bag which fits underneath clothing.  It is okay to wear the overnight bag at any time, but NEVER wear the smaller leg bag at night.  Keep the drainage bag well below the level of your bladder. This prevents backflow of urine into the bladder and allows the urine to drain freely.  Anchor the tubing to your leg to prevent pulling or tension on the catheter. Use tape or a leg strap provided by the hospital.  Empty the drainage bag when it is 1/2 to 3/4 full. Wash your hands before and after touching the bag.  Periodically check the tubing for kinks to make sure there is no pressure on the tubing which could restrict the flow of urine.  Changing the Drainage Bags Cleanse both ends of the clean bag with alcohol before changing.  Pinch off the rubber catheter to avoid urine spillage during the disconnection.  Disconnect the dirty bag and connect the clean one.  Empty the dirty bag carefully to avoid a urine spill.  Attach the new bag to the leg with tape or a leg strap.  Cleaning the Drainage Bags Whenever a drainage bag is disconnected, it must be cleaned quickly so it is ready for the next use.  Wash the bag in warm, soapy water.  Rinse the bag thoroughly with warm water.  Soak the bag for 30 minutes in a solution of white vinegar and water (1 cup vinegar to 1 quart warm water).  Rinse with warm water.   IT Normal To See Some blood in the urine is normal, as long as you can see your fingers through the catheter tubing (not the bag) this is ok. As long as the urine is not the consistency of tomato paste.  It will be normal to feel the urge to urinate often this is a side effect of the catheter  Some discharge around the catheter where it exits the penis is normal  SEEK  MEDICAL CARE IF:  You have chills or night sweats.  You are leaking around your catheter or have problems with your catheter. It is not uncommon to have sporadic leakage around your catheter as a result of bladder spasms. If the leakage stops, there is not much need for concern. If you are uncertain, call your caregiver.  You develop side effects that you think are coming from your medicines.  SEEK IMMEDIATE MEDICAL CARE IF:  You are suddenly unable to urinate. Check to see if there are any kinks in the drainage tubing that may cause this. If you cannot find any kinks, call your caregiver immediately. This is an emergency.  You develop shortness of breath or chest pains.  Bleeding persists or clots develop in your urine.  You have a fever.  You develop pain in your back or over your  lower belly (abdomen).  You develop pain or swelling in your legs.  Any problems you are having get worse rather than better.  MAKE SURE YOU:  Understand these instructions.  Will watch your condition.  Will get help right away if you are not doing well or get worse.

## 2024-05-22 NOTE — Op Note (Signed)
  Preoperative diagnosis: Pendulous urethral stricture    Postoperative diagnosis: Pendulous urethral stricture    Procedure performed: Retrograde urethrogram, urethral dilation, Optilume urethral dilation, cystosocpy   Surgeon: Steffan Pea   Operative findings:  2cm length pendulous urethral stx at the penile scrotal junction would be amenable ot urethroplasty with warner incision in the future if recurrence. Dilated to 24 fr, Optilume dilation for 5 minutes.    Retrograde urethrogram interpretation:    Anesthesia: General   Fluids: Per anesthesia   EBL: Minimal   Antibiotics: Ampicillin and gentamicin   Drains: 20 [place] council catheter 10 cc balloon   Specimens: None   Complications: None   Indication for procedure: 52 M w/ hx of urethral stricture during TURP done in December 2024.  He has since developed significant urinary symptoms including weak stream and straining with urination and post void fullness.  Urethroplasty with buccal graft or optilume urethral dilation. The risks were discussed for both the patient opted for optilume urethral dilation. The risks and benefits were discussed prior tot he procedure and the patient would like to proceed today.    Procedure in detail: After informed consent was confirmed in the preoperative patient was taken the operative suite placed under anesthesia by the anesthesia team he was then prepped and draped in usual sterile fashion next time was performed verifying correct patient procedure he was given preoperative antibiotics.  Retrograde urethrogram was performed using a Pollick catheter demonstrating no strictures within the pendulous urethra.  Next a cystoscope was advanced into the urethra we encountered the stricture.  A sensor wire was placed past this scope was then removed leaving the wire in place in the Ultrex dilation balloon was introduced into the urethra and dilated to 24 [place] at 20 ATM.  After this was left in place  it was deflated and removed scope was then reintroduced into the urethra and was able to evaluate the bladder.  The scope was then removed.  And a Optilume balloon was then placed over the location of the previous stricture and inflated for 5 minutes at 10 ATM.  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   Disposition: Patient will be void trial in 4-7 days

## 2024-05-22 NOTE — Anesthesia Postprocedure Evaluation (Signed)
 Anesthesia Post Note  Patient: Steven Steele  Procedure(s) Performed: CYSTOURETHROSCOPY, WITH URETHRAL STRICTURE DILATION USING DRUG-COATED BALLOON (Ureter) CYSTOSCOPY, WITH RETROGRADE PYELOGRAM (Urethra)     Patient location during evaluation: PACU Anesthesia Type: General Level of consciousness: awake and alert Pain management: pain level controlled Vital Signs Assessment: post-procedure vital signs reviewed and stable Respiratory status: spontaneous breathing, nonlabored ventilation, respiratory function stable and patient connected to nasal cannula oxygen Cardiovascular status: blood pressure returned to baseline and stable Postop Assessment: no apparent nausea or vomiting Anesthetic complications: no   No notable events documented.  Last Vitals:  Vitals:   05/22/24 1239 05/22/24 1245  BP: 116/70   Pulse: 68   Resp: 10   Temp:  (!) 36.4 C  SpO2: 94%     Last Pain:  Vitals:   05/22/24 1239  TempSrc:   PainSc: 2                  Cordella SQUIBB Caylen Kuwahara

## 2024-05-23 ENCOUNTER — Encounter (HOSPITAL_COMMUNITY): Payer: Self-pay | Admitting: Urology

## 2024-05-26 DIAGNOSIS — R3914 Feeling of incomplete bladder emptying: Secondary | ICD-10-CM | POA: Diagnosis not present

## 2024-05-26 DIAGNOSIS — N35816 Other urethral stricture, male, overlapping sites: Secondary | ICD-10-CM | POA: Diagnosis not present

## 2024-05-26 DIAGNOSIS — N401 Enlarged prostate with lower urinary tract symptoms: Secondary | ICD-10-CM | POA: Diagnosis not present

## 2024-05-26 DIAGNOSIS — R350 Frequency of micturition: Secondary | ICD-10-CM | POA: Diagnosis not present

## 2024-07-23 DIAGNOSIS — N401 Enlarged prostate with lower urinary tract symptoms: Secondary | ICD-10-CM | POA: Diagnosis not present

## 2024-07-30 DIAGNOSIS — R300 Dysuria: Secondary | ICD-10-CM | POA: Diagnosis not present

## 2024-07-30 DIAGNOSIS — R3914 Feeling of incomplete bladder emptying: Secondary | ICD-10-CM | POA: Diagnosis not present

## 2024-07-30 DIAGNOSIS — N35816 Other urethral stricture, male, overlapping sites: Secondary | ICD-10-CM | POA: Diagnosis not present

## 2024-07-30 DIAGNOSIS — N401 Enlarged prostate with lower urinary tract symptoms: Secondary | ICD-10-CM | POA: Diagnosis not present

## 2024-08-11 ENCOUNTER — Other Ambulatory Visit (HOSPITAL_COMMUNITY): Payer: Self-pay

## 2024-08-13 DIAGNOSIS — G4733 Obstructive sleep apnea (adult) (pediatric): Secondary | ICD-10-CM | POA: Diagnosis not present
